# Patient Record
Sex: Male | Born: 1945 | Race: White | Hispanic: No | Marital: Married | Smoking: Former smoker
Health system: Southern US, Community
[De-identification: ages and names within clinical notes are randomized; demographics above are authoritative.]

## PROBLEM LIST (undated history)

## (undated) DIAGNOSIS — I519 Heart disease, unspecified: Secondary | ICD-10-CM

## (undated) DIAGNOSIS — Z95810 Presence of automatic (implantable) cardiac defibrillator: Secondary | ICD-10-CM

## (undated) DIAGNOSIS — Z0389 Encounter for observation for other suspected diseases and conditions ruled out: Secondary | ICD-10-CM

## (undated) DIAGNOSIS — E785 Hyperlipidemia, unspecified: Secondary | ICD-10-CM

## (undated) DIAGNOSIS — IMO0001 Reserved for inherently not codable concepts without codable children: Secondary | ICD-10-CM

## (undated) DIAGNOSIS — I4891 Unspecified atrial fibrillation: Secondary | ICD-10-CM

## (undated) DIAGNOSIS — I10 Essential (primary) hypertension: Secondary | ICD-10-CM

## (undated) DIAGNOSIS — R002 Palpitations: Secondary | ICD-10-CM

## (undated) DIAGNOSIS — I5020 Unspecified systolic (congestive) heart failure: Secondary | ICD-10-CM

## (undated) HISTORY — DX: Heart disease, unspecified: I51.9

## (undated) HISTORY — DX: Hyperlipidemia, unspecified: E78.5

## (undated) HISTORY — DX: Encounter for observation for other suspected diseases and conditions ruled out: Z03.89

## (undated) HISTORY — DX: Palpitations: R00.2

## (undated) HISTORY — DX: Reserved for inherently not codable concepts without codable children: IMO0001

## (undated) HISTORY — DX: Essential (primary) hypertension: I10

## (undated) HISTORY — DX: Unspecified atrial fibrillation: I48.91

---

## 2010-06-15 HISTORY — PX: NM MYOVIEW LTD: HXRAD82

## 2010-06-15 HISTORY — PX: DOPPLER ECHOCARDIOGRAPHY: SHX263

## 2014-08-28 DIAGNOSIS — J0100 Acute maxillary sinusitis, unspecified: Secondary | ICD-10-CM | POA: Diagnosis not present

## 2014-08-28 DIAGNOSIS — H66009 Acute suppurative otitis media without spontaneous rupture of ear drum, unspecified ear: Secondary | ICD-10-CM | POA: Diagnosis not present

## 2014-08-31 DIAGNOSIS — Z8042 Family history of malignant neoplasm of prostate: Secondary | ICD-10-CM | POA: Diagnosis not present

## 2014-08-31 DIAGNOSIS — R972 Elevated prostate specific antigen [PSA]: Secondary | ICD-10-CM | POA: Diagnosis not present

## 2014-08-31 DIAGNOSIS — N400 Enlarged prostate without lower urinary tract symptoms: Secondary | ICD-10-CM | POA: Diagnosis not present

## 2014-11-01 DIAGNOSIS — E039 Hypothyroidism, unspecified: Secondary | ICD-10-CM | POA: Diagnosis not present

## 2014-11-01 DIAGNOSIS — I10 Essential (primary) hypertension: Secondary | ICD-10-CM | POA: Diagnosis not present

## 2014-11-01 DIAGNOSIS — Z79899 Other long term (current) drug therapy: Secondary | ICD-10-CM | POA: Diagnosis not present

## 2014-11-08 ENCOUNTER — Encounter: Payer: Self-pay | Admitting: *Deleted

## 2014-11-14 ENCOUNTER — Ambulatory Visit (INDEPENDENT_AMBULATORY_CARE_PROVIDER_SITE_OTHER): Payer: Medicare Other

## 2014-11-14 ENCOUNTER — Encounter: Payer: Self-pay | Admitting: Cardiovascular Disease

## 2014-11-14 ENCOUNTER — Ambulatory Visit (INDEPENDENT_AMBULATORY_CARE_PROVIDER_SITE_OTHER): Payer: Medicare Other | Admitting: Cardiovascular Disease

## 2014-11-14 VITALS — BP 142/90 | HR 111 | Ht 72.0 in | Wt 203.8 lb

## 2014-11-14 DIAGNOSIS — E785 Hyperlipidemia, unspecified: Secondary | ICD-10-CM | POA: Insufficient documentation

## 2014-11-14 DIAGNOSIS — I4819 Other persistent atrial fibrillation: Secondary | ICD-10-CM

## 2014-11-14 DIAGNOSIS — I10 Essential (primary) hypertension: Secondary | ICD-10-CM

## 2014-11-14 DIAGNOSIS — R0602 Shortness of breath: Secondary | ICD-10-CM

## 2014-11-14 DIAGNOSIS — I4891 Unspecified atrial fibrillation: Secondary | ICD-10-CM | POA: Insufficient documentation

## 2014-11-14 DIAGNOSIS — I481 Persistent atrial fibrillation: Secondary | ICD-10-CM

## 2014-11-14 DIAGNOSIS — I482 Chronic atrial fibrillation, unspecified: Secondary | ICD-10-CM

## 2014-11-14 DIAGNOSIS — I480 Paroxysmal atrial fibrillation: Secondary | ICD-10-CM | POA: Diagnosis not present

## 2014-11-14 DIAGNOSIS — I519 Heart disease, unspecified: Secondary | ICD-10-CM | POA: Diagnosis not present

## 2014-11-14 MED ORDER — EDOXABAN TOSYLATE 60 MG PO TABS: 60.0000 mg | ORAL_TABLET | Freq: Every day | ORAL | Status: DC

## 2014-11-14 NOTE — Assessment & Plan Note (Addendum)
When I saw Mr. Feeback several years ago he had episodes of PAF on an event monitor. That point I did not feel he required oral anti-coagulation. Today he is in atrial fibrillation with a rapid ventricular response. His CHA2DSVASC2 score is 2. I've elected to begin him on a novel oral anticoagulative. I'm going to get a warm monitor to assess his heart rate variability to help decide whether to switch him to diltiazem. I'm also going to a 2-D echo because of increased shortness of breath.

## 2014-11-14 NOTE — Progress Notes (Signed)
11/14/2014 Marc Potter   03-20-1946  [id number]  Primary Physician No primary care provider on file. Primary Cardiologist: Marc Gess MD Marc Potter   HPI:  Marc Potter is a 69 year old moderately overweight married Caucasian male father of one biologic and 2 stepchildren, grandfather and 6 grandchildren whose mother-in-law, Marc Potter , is a patient of mine as well. He has history of hypertension and hyperlipidemia. He is a normal 2-D echo Myoview in the past. He had PAF on event monitoring she was symptomatic from it at that point several years ago I elected to begin him on aspirin alone. He does admit to mild increasing dyspnea on exertion but denies chest pain.   Current Outpatient Prescriptions  Medication Sig Dispense Refill  . amLODipine (NORVASC) 5 MG tablet Take 1 tablet by mouth daily.    [name] aspirin 81 MG tablet Take 81 mg by mouth daily.    . calcium carbonate (OS-CAL) 600 MG TABS tablet Take 600 mg by mouth 2 (two) times daily with a meal.    . multivitamin-lutein (OCUVITE-LUTEIN) CAPS capsule Take 1 capsule by mouth daily.    [name] thyroid (ARMOUR) 90 MG tablet Take 90 mg by mouth daily.     No current facility-administered medications for this visit.    No Known Allergies  History   Social History  . Marital Status: Married    Spouse Name: N/A  . Number of Children: N/A  . Years of Education: N/A   Occupational History  . Not on file.   Social History Main Topics  . Smoking status: Former Games developer  . Smokeless tobacco: Not on file  . Alcohol Use: Yes     Comment: DRINKS ON OCCASION   . Drug Use: No  . Sexual Activity: Not on file   Other Topics Concern  . Not on file   Social History Narrative     Review of Systems: General: negative for chills, fever, night sweats or weight changes.  Cardiovascular: negative for chest pain, dyspnea on exertion, edema, orthopnea, palpitations, paroxysmal nocturnal dyspnea or shortness of  breath Dermatological: negative for rash Respiratory: negative for cough or wheezing Urologic: negative for hematuria Abdominal: negative for nausea, vomiting, diarrhea, bright red blood per rectum, melena, or hematemesis Neurologic: negative for visual changes, syncope, or dizziness All other systems reviewed and are otherwise negative except as noted above.    Blood pressure 142/90, pulse 111, height 6' (1.829 m), weight 203 lb 12.8 oz (92.443 kg).  General appearance: alert and no distress Neck: no adenopathy, no carotid bruit, no JVD, supple, symmetrical, trachea midline and thyroid not enlarged, symmetric, no tenderness/mass/nodules Lungs: clear to auscultation bilaterally Heart: irregularly irregular rhythm Extremities: extremities normal, atraumatic, no cyanosis or edema  EKG a tilt ablation with a ventricular response in the 111 and septal Q waves and nonspecific ST and T-wave changes (inferolateral ischemia). I personally reviewed this EKG. This is unchanged from prior EKGs  ASSESSMENT AND PLAN:   Essential hypertension History of hypertension with blood pressure measured at 142/90. He is on amlodipine. I am considering changing to diltiazem for heart rate control after we've assessed heart rate variability with a 30 day event monitor   Atrial fibrillation When I saw Marc Potter several years ago he had episodes of PAF on an event monitor. That point I did not feel he required oral anti-coagulation. Today he is in atrial fibrillation with a rapid ventricular response. His CHA2DSVASC2 score is 2. I've elected to begin  him on a novel oral anticoagulative. I'm going to get a warm monitor to assess his heart rate variability to help decide whether to switch him to diltiazem. I'm also going to a 2-D echo because of increased shortness of breath.       Marc Gess MD FACP,FACC,FAHA, [facility] 11/14/2014 3:03 PM

## 2014-11-14 NOTE — Patient Instructions (Addendum)
Your physician has requested that you have an echocardiogram. Echocardiography is a painless test that uses sound waves to create images of your heart. It provides your doctor with information about the size and shape of your heart and how well your heart's chambers and valves are working. This procedure takes approximately one hour. There are no restrictions for this procedure.  Your physician has recommended that you wear an event monitor. Event monitors are medical devices that record the heart's electrical activity. Doctors most often [place] these monitors to diagnose arrhythmias. Arrhythmias are problems with the speed or rhythm of the heartbeat. The monitor is a small, portable device. You can wear one while you do your normal daily activities. This is usually used to diagnose what is causing palpitations/syncope (passing out).  Dr Allyson Sabal recommends that you schedule a follow-up appointment after you have completed your studies.  Your Doctor has ordered you to wear a heart monitor. You will wear this for 30 days.   TIPS -  REMINDERS 1. The sensor is the lanyard that is worn around your neck every day - this is powered by a battery that needs to be changed every day 2. The monitor is the device that allows you to record symptoms - this will need to be charged daily 3. The sensor & monitor need to be within 100 feet of each other at all times 4. The sensor connects to the electrodes (stickers) - these should be changed every 24-48 hours (you do not have to remove them when you bathe, just make sure they are dry when you connect it back to the sensor 5. If you need more supplies (electrodes, batteries), please call the 1-800 # on the back of the pamphlet and [employer] will mail you more supplies 6. If your skin becomes sensitive, please try the sample pack of sensitive skin electrodes (the white packet in your silver box) and call [employer] to have them mail you more of these type of electrodes 7. When  you are finish wearing the monitor, please place all supplies back in the silver box, place the silver box in the pre-packaged UPS bag and drop off at [employer] or call them so they can come pick it up   Cardiac Event Monitoring A cardiac event monitor is a small recording device used to help detect abnormal heart rhythms (arrhythmias). The monitor is used to record heart rhythm when noticeable symptoms such as the following occur:  Fast heartbeats (palpitations), such as heart racing or fluttering.  Dizziness.  Fainting or light-headedness.  Unexplained weakness. The monitor is wired to two electrodes placed on your chest. Electrodes are flat, sticky disks that attach to your skin. The monitor can be worn for up to 30 days. You will wear the monitor at all times, except when bathing.  HOW TO USE YOUR CARDIAC EVENT MONITOR A technician will prepare your chest for the electrode placement. The technician will show you how to place the electrodes, how to work the monitor, and how to replace the batteries. Take time to practice using the monitor before you leave the office. Make sure you understand how to send the information from the monitor to your health care provider. This requires a telephone with a landline, not a cell phone. You need to:  Wear your monitor at all times, except when you are in water:  Do not get the monitor wet.  Take the monitor off when bathing. Do not swim or use a hot tub with it  on.  Keep your skin clean. Do not put body lotion or moisturizer on your chest.  Change the electrodes daily or any time they stop sticking to your skin. You might need to use tape to keep them on.  It is possible that your skin under the electrodes could become irritated. To keep this from happening, try to put the electrodes in slightly different places on your chest. However, they must remain in the area under your left breast and in the upper right section of your chest.  Make sure the  monitor is safely clipped to your clothing or in a location close to your body that your health care provider recommends.  Press the button to record when you feel symptoms of heart trouble, such as dizziness, weakness, light-headedness, palpitations, thumping, shortness of breath, unexplained weakness, or a fluttering or racing heart. The monitor is always on and records what happened slightly before you pressed the button, so do not worry about being too late to get good information.  Keep a diary of your activities, such as walking, doing chores, and taking medicine. It is especially important to note what you were doing when you pushed the button to record your symptoms. This will help your health care provider determine what might be contributing to your symptoms. The information stored in your monitor will be reviewed by your health care provider alongside your diary entries.  Send the recorded information as recommended by your health care provider. It is important to understand that it will take some time for your health care provider to process the results.  Change the batteries as recommended by your health care provider. SEEK IMMEDIATE MEDICAL CARE IF:   You have chest pain.  You have extreme difficulty breathing or shortness of breath.  You develop a very fast heartbeat that persists.  You develop dizziness that does not go away.  You faint or constantly feel you are about to faint. Document Released: 03/10/2008 Document Revised: 10/16/2013 Document Reviewed: 11/28/2012 [facility] Patient Information 2015 [place], [place]. This information is not intended to replace advice given to you by your health care provider. Make sure you discuss any questions you have with your health care provider.  Medication samples have been provided to the patient. Drug name: Savaysa 60 mg  Qty: 28 tabs LOT: [id number] Exp.Date: 03/2015 Ian Bushman 3:22 PM 11/14/2014

## 2014-11-14 NOTE — Assessment & Plan Note (Signed)
History of hypertension with blood pressure measured at 142/90. He is on amlodipine. I am considering changing to diltiazem for heart rate control after we've assessed heart rate variability with a 30 day event monitor

## 2014-11-15 ENCOUNTER — Encounter: Payer: Self-pay | Admitting: Cardiovascular Disease

## 2014-11-19 DIAGNOSIS — H2513 Age-related nuclear cataract, bilateral: Secondary | ICD-10-CM | POA: Diagnosis not present

## 2014-11-19 DIAGNOSIS — H3531 Nonexudative age-related macular degeneration: Secondary | ICD-10-CM | POA: Diagnosis not present

## 2014-11-21 ENCOUNTER — Telehealth: Payer: Self-pay | Admitting: Cardiovascular Disease

## 2014-11-21 NOTE — Telephone Encounter (Signed)
Katrina from [employer] - autotrigger yesterday evening - high heart rate AF 175 bpm They will fax strips over for MD to review.

## 2014-11-21 NOTE — Telephone Encounter (Signed)
Urgent monitor reviewed by DOD Dr. Allyson Sabal - no medication changes advised.   Spoke with patient - he reports he had NO symptoms around the time of the urgent report but did note his FitBit reported his HR up to 119bpm.   He will record symptoms as he notices them and continue to wear monitor.

## 2014-11-21 NOTE — Telephone Encounter (Signed)
Patient reports around time of urgent report yesterday he was sweaty and the leads were coming off.   Of note, he does not have monitor on now, as he is hot and sweaty, but will put on later today

## 2014-11-21 NOTE — Telephone Encounter (Signed)
Pt says he needs to talk to you again.

## 2014-11-22 ENCOUNTER — Encounter: Payer: Self-pay | Admitting: *Deleted

## 2014-11-22 ENCOUNTER — Other Ambulatory Visit: Payer: Self-pay | Admitting: *Deleted

## 2014-11-22 DIAGNOSIS — E785 Hyperlipidemia, unspecified: Secondary | ICD-10-CM

## 2014-11-23 ENCOUNTER — Telehealth: Payer: Self-pay | Admitting: Cardiovascular Disease

## 2014-11-23 NOTE — Telephone Encounter (Signed)
Wrong provider

## 2014-11-23 NOTE — Telephone Encounter (Signed)
Caryn Bee has an abnormal EKG to report

## 2014-11-23 NOTE — Telephone Encounter (Signed)
Strips reviewed by Dr. Antoine Poche - no changes indicated. Placed printout in accordion file.

## 2014-11-23 NOTE — Telephone Encounter (Signed)
Rapid AF- lasting 90 seconds High HR 170bpms  [facility] will fax strips

## 2014-11-24 ENCOUNTER — Telehealth: Payer: Self-pay | Admitting: Cardiology

## 2014-11-24 NOTE — Telephone Encounter (Signed)
Contacted by [employer]-pt had 65 seconds of tachycardia. I called pt to discuss switching him to Diltiazem but he felt that wasn't neccessary at this time.   Corine Shelter PA-C 11/24/2014 9:05 AM

## 2014-11-26 ENCOUNTER — Ambulatory Visit (HOSPITAL_COMMUNITY)
Admission: RE | Admit: 2014-11-26 | Discharge: 2014-11-26 | Disposition: A | Discharge status: Home / Self Care | Payer: Medicare Other | Source: Ambulatory Visit | Attending: Internal Medicine | Admitting: Internal Medicine

## 2014-11-26 DIAGNOSIS — I517 Cardiomegaly: Secondary | ICD-10-CM | POA: Diagnosis not present

## 2014-11-26 DIAGNOSIS — R0600 Dyspnea, unspecified: Secondary | ICD-10-CM | POA: Diagnosis present

## 2014-11-26 DIAGNOSIS — I340 Nonrheumatic mitral (valve) insufficiency: Secondary | ICD-10-CM | POA: Insufficient documentation

## 2014-11-26 DIAGNOSIS — I519 Heart disease, unspecified: Secondary | ICD-10-CM | POA: Diagnosis not present

## 2014-11-26 DIAGNOSIS — R0602 Shortness of breath: Secondary | ICD-10-CM

## 2014-11-26 NOTE — Progress Notes (Signed)
2D Echo Completed, 11-26-14.  Xana Bradt, RDCS.  The Echo shows reduced function, EF around 20-25%.  These findings were discussed with DOD, Dr. Rennis Golden who suggested a follow up appointment be scheduled.  Mr. Hooven is having no sympotoms at the time of exam and appears stable.  He has been put on a re-call list for an appointment after his Event Monitor is removed.

## 2014-12-03 ENCOUNTER — Telehealth: Payer: Self-pay | Admitting: Cardiovascular Disease

## 2014-12-03 NOTE — Telephone Encounter (Signed)
[facility] called to report A Fib - rate 168, autotriggered.  Occurred yesterday, 6:42pm w/ 75 second duration.  Returned to regular rate.  Fax pending

## 2014-12-03 NOTE — Telephone Encounter (Signed)
Marc Potter has some urgent EKG results.

## 2014-12-05 ENCOUNTER — Ambulatory Visit: Payer: Medicare Other | Admitting: Cardiovascular Disease

## 2014-12-05 NOTE — Telephone Encounter (Signed)
Can this encounter be closed?

## 2014-12-06 ENCOUNTER — Telehealth: Payer: Self-pay | Admitting: Cardiovascular Disease

## 2014-12-06 NOTE — Telephone Encounter (Signed)
°  1. Which medications need to be refilled? Savaysa-pt is out of town-need enough until he gets back in town  2. Which pharmacy is medication to be sent to?[phone]  3. Do they need a 30 day or 90 day supply? #6  4. Would they like a call back once the medication has been sent to the pharmacy? yes

## 2014-12-06 NOTE — Telephone Encounter (Signed)
Phoned in Rx for savaysa 60mg  (take 1 PO QD) #6 with NO refills.  Unable to e-scribe medication.  Patient is aware that medication will be phoned in today

## 2014-12-10 ENCOUNTER — Telehealth: Payer: Self-pay | Admitting: Cardiovascular Disease

## 2014-12-10 MED ORDER — EDOXABAN TOSYLATE 60 MG PO TABS: 60.0000 mg | ORAL_TABLET | Freq: Every day | ORAL | Status: DC

## 2014-12-10 NOTE — Telephone Encounter (Signed)
Spoke to patient  Samples available 3 packs Patient will pick up

## 2014-12-10 NOTE — Telephone Encounter (Signed)
Patient calling the office for samples of medication:   1.  What medication and dosage are you requesting samples for? Savaysa  2.  Are you currently out of this medication? He has 4 pills left, he needed some samples to last him until his next appt on 7/12  3. Are you requesting samples to get you through until a mail order prescription arrives? No

## 2014-12-13 ENCOUNTER — Telehealth: Payer: Self-pay | Admitting: Cardiovascular Disease

## 2014-12-13 MED ORDER — DILTIAZEM HCL ER COATED BEADS 180 MG PO CP24: 180.0000 mg | ORAL_CAPSULE | Freq: Every day | ORAL | Status: DC

## 2014-12-13 NOTE — Telephone Encounter (Signed)
Received a call from Shanda Bumps calling to report at 11:22 am today Afib with rate 158.Will fax a copy of EKG strip.

## 2014-12-13 NOTE — Telephone Encounter (Signed)
Received a call from [facility] patient had another episode of AFib at 3:03 pm today rate 170.Spoke to patient he stated he just picked up diltiazem.Advised to start taking 180 mg daily.Stop amlodipine.Keep appointment with Dr.Berry as planned.

## 2014-12-13 NOTE — Telephone Encounter (Signed)
[facility] called reporting another episode of HHR A Fib. 168 bpm, autotrigger event. Will show fax to Dr. Herbie Baltimore or Dr. Allyson Sabal once received for [facility], med change instructions already given for patient  in response to prior episode.

## 2014-12-13 NOTE — Telephone Encounter (Signed)
Patient called Marc Potter Dr.Harding reviewed EKG strip which revealed AFib rate 158 at 11:21 am.He advised to stop amlodipine and start diltiazem 180 mg daily.Advised to keep appointment with Dr.Berry 12/18/14 at 3:30 pm.

## 2014-12-13 NOTE — Telephone Encounter (Signed)
Please call,pt wants to talk to you again.

## 2014-12-13 NOTE — Telephone Encounter (Signed)
[facility] is calling a abnormal EKG .

## 2014-12-18 ENCOUNTER — Ambulatory Visit (INDEPENDENT_AMBULATORY_CARE_PROVIDER_SITE_OTHER): Payer: Medicare Other | Admitting: Cardiovascular Disease

## 2014-12-18 ENCOUNTER — Encounter: Payer: Self-pay | Admitting: Cardiovascular Disease

## 2014-12-18 VITALS — BP 130/70 | HR 61 | Ht 72.0 in | Wt 200.2 lb

## 2014-12-18 DIAGNOSIS — I519 Heart disease, unspecified: Secondary | ICD-10-CM

## 2014-12-18 DIAGNOSIS — D689 Coagulation defect, unspecified: Secondary | ICD-10-CM | POA: Diagnosis not present

## 2014-12-18 DIAGNOSIS — Z79899 Other long term (current) drug therapy: Secondary | ICD-10-CM

## 2014-12-18 DIAGNOSIS — E785 Hyperlipidemia, unspecified: Secondary | ICD-10-CM

## 2014-12-18 DIAGNOSIS — I482 Chronic atrial fibrillation, unspecified: Secondary | ICD-10-CM

## 2014-12-18 DIAGNOSIS — Z87891 Personal history of nicotine dependence: Secondary | ICD-10-CM

## 2014-12-18 DIAGNOSIS — R5383 Other fatigue: Secondary | ICD-10-CM

## 2014-12-18 DIAGNOSIS — I10 Essential (primary) hypertension: Secondary | ICD-10-CM

## 2014-12-18 NOTE — Patient Instructions (Signed)
Your physician has requested that you have a radial cardiac catheterization. Cardiac catheterization is used to diagnose and/or treat various heart conditions. Doctors may recommend this procedure for a number of different reasons. The most common reason is to evaluate chest pain. Chest pain can be a symptom of coronary artery disease (CAD), and cardiac catheterization can show whether plaque is narrowing or blocking your heart's arteries. This procedure is also used to evaluate the valves, as well as measure the blood flow and oxygen levels in different parts of your heart. For further information please visit [email or web address]   Following your catheterization, you will not be allowed to drive for 3 days.  No lifting, pushing, or pulling greater that 10 pounds is allowed for 1 week.  You will be required to have the following tests prior to the procedure:  1. Blood work-the blood work can be done no more than 7 days prior to the procedure.  It can be done at any [facility] lab.  There is one downstairs on the first floor of this building and one in the [facility] building [id number]).  2. Chest Xray-the chest xray order has already been placed at the [facility].     **Please stop your Savaysa 3 days prior to your procedure.  You will be told when to restart the medication after your procedure.

## 2014-12-18 NOTE — Assessment & Plan Note (Signed)
Atrial fibrillation with a controlled ventricular response currently on calcium channel blocker and oral anticoagulation

## 2014-12-18 NOTE — Assessment & Plan Note (Signed)
History of hypertension with blood pressure measured today at 130/70. He is on diltiazem CD 180 mg a day.

## 2014-12-18 NOTE — Assessment & Plan Note (Addendum)
Marc Potter returns today for follow-up of his 2-D echo. I saw him a month ago with complaints of increasing dyspnea. Echo performed on 03/12/11 showed normal LV systolic function. Echo performed 11/26/14 revealed an EF of 25-30% with diffuse hypokinesia and akinesia of the mid anteroapical and septal. Given his significant decline in EF with focal wall motion allergies I feel compelled to perform cardiac catheterization to define his anatomy and rule out an ischemic etiology. I'm scheduling this next week. Right radial approach. We'll stop his I'll anticoagulate several days prior to the study.we may need to start him on an ACE inhibitor and a beta blocker as well.

## 2014-12-18 NOTE — Progress Notes (Signed)
Marc Potter returns for follow-up of his 2-D echo which showed a decline in LV function from normal 4 years ago to 25% with anterior wall motion analysis. His monitor showed A. Fib with RVR. I did change his amlodipine to diltiazem with an decline in his ventricular response and added an oral anticoagulated. Based on a decline in LV function and going to proceed with outpatient diagnostic coronary arteriography via the right radial approach to rule out an ischemic etiology. I feel he explained the risks and benefits.  [name], M.D., FACP, FACC, FAHA, FSCAI [facility] [address] [place], [place]  [address]  [phone] 12/18/2014 4:17 PM  

## 2014-12-18 NOTE — Assessment & Plan Note (Signed)
Currently not on a statin drug. We will check a lipid and liver profile

## 2014-12-20 ENCOUNTER — Ambulatory Visit
Admission: RE | Admit: 2014-12-20 | Discharge: 2014-12-20 | Disposition: A | Discharge status: Home / Self Care | Payer: Medicare Other | Source: Ambulatory Visit | Attending: Cardiovascular Disease | Admitting: Cardiovascular Disease

## 2014-12-20 DIAGNOSIS — R0602 Shortness of breath: Secondary | ICD-10-CM | POA: Diagnosis not present

## 2014-12-20 DIAGNOSIS — E785 Hyperlipidemia, unspecified: Secondary | ICD-10-CM | POA: Diagnosis not present

## 2014-12-20 DIAGNOSIS — Z79899 Other long term (current) drug therapy: Secondary | ICD-10-CM | POA: Diagnosis not present

## 2014-12-20 DIAGNOSIS — Z87891 Personal history of nicotine dependence: Secondary | ICD-10-CM

## 2014-12-20 LAB — BASIC METABOLIC PANEL
BUN: 12 mg/dL (ref 6–23)
CO2: 26 meq/L (ref 19–32)
Calcium: 9.7 mg/dL (ref 8.4–10.5)
Chloride: 107 mEq/L (ref 96–112)
Creat: 0.94 mg/dL (ref 0.50–1.35)
GLUCOSE: 106 mg/dL — AB (ref 70–99)
POTASSIUM: 4.4 meq/L (ref 3.5–5.3)
SODIUM: 144 meq/L (ref 135–145)

## 2014-12-20 LAB — CBC
HCT: 44.9 % (ref 39.0–52.0)
HEMOGLOBIN: 15.3 g/dL (ref 13.0–17.0)
MCH: 31.4 pg (ref 26.0–34.0)
MCHC: 34.1 g/dL (ref 30.0–36.0)
MCV: 92.2 fL (ref 78.0–100.0)
MPV: 12 fL (ref 8.6–12.4)
Platelets: 192 10*3/uL (ref 150–400)
RBC: 4.87 MIL/uL (ref 4.22–5.81)
RDW: 13.4 % (ref 11.5–15.5)
WBC: 5.4 10*3/uL (ref 4.0–10.5)

## 2014-12-20 LAB — LIPID PANEL
CHOL/HDL RATIO: 3.8 ratio
Cholesterol: 164 mg/dL (ref 0–200)
HDL: 43 mg/dL (ref >=40)
LDL CALC: 79 mg/dL (ref 0–99)
Triglycerides: 209 mg/dL — ABNORMAL HIGH (ref <150)
VLDL: 42 mg/dL — ABNORMAL HIGH (ref 0–40)

## 2014-12-20 LAB — HEPATIC FUNCTION PANEL
ALBUMIN: 4.5 g/dL (ref 3.5–5.2)
ALK PHOS: 57 U/L (ref 39–117)
ALT: 25 U/L (ref 0–53)
AST: 19 U/L (ref 0–37)
BILIRUBIN TOTAL: 0.7 mg/dL (ref 0.2–1.2)
Bilirubin, Direct: 0.1 mg/dL (ref 0.0–0.3)
Indirect Bilirubin: 0.6 mg/dL (ref 0.2–1.2)
Total Protein: 6.3 g/dL (ref 6.0–8.3)

## 2014-12-20 LAB — APTT: aPTT: 33 seconds (ref 24–37)

## 2014-12-20 LAB — PROTIME-INR
INR: 1.18 (ref <1.50)
PROTHROMBIN TIME: 15 s (ref 11.6–15.2)

## 2014-12-20 LAB — TSH: TSH: 0.943 u[IU]/mL (ref 0.350–4.500)

## 2014-12-24 ENCOUNTER — Ambulatory Visit (HOSPITAL_COMMUNITY)
Admission: RE | Admit: 2014-12-24 | Discharge: 2014-12-24 | Disposition: A | Discharge status: Home / Self Care | Payer: Medicare Other | Source: Ambulatory Visit | Attending: Cardiovascular Disease | Admitting: Cardiovascular Disease

## 2014-12-24 ENCOUNTER — Encounter (HOSPITAL_COMMUNITY)
Admission: RE | Disposition: A | Discharge status: Home / Self Care | Payer: Medicare Other | Source: Ambulatory Visit | Attending: Cardiovascular Disease

## 2014-12-24 DIAGNOSIS — Z7982 Long term (current) use of aspirin: Secondary | ICD-10-CM | POA: Insufficient documentation

## 2014-12-24 DIAGNOSIS — I429 Cardiomyopathy, unspecified: Secondary | ICD-10-CM | POA: Diagnosis not present

## 2014-12-24 DIAGNOSIS — I10 Essential (primary) hypertension: Secondary | ICD-10-CM | POA: Diagnosis not present

## 2014-12-24 DIAGNOSIS — E663 Overweight: Secondary | ICD-10-CM | POA: Diagnosis not present

## 2014-12-24 DIAGNOSIS — I482 Chronic atrial fibrillation: Secondary | ICD-10-CM | POA: Insufficient documentation

## 2014-12-24 DIAGNOSIS — I519 Heart disease, unspecified: Secondary | ICD-10-CM | POA: Diagnosis not present

## 2014-12-24 DIAGNOSIS — E785 Hyperlipidemia, unspecified: Secondary | ICD-10-CM | POA: Diagnosis not present

## 2014-12-24 HISTORY — PX: CARDIAC CATHETERIZATION: SHX172

## 2014-12-24 SURGERY — LEFT HEART CATH AND CORONARY ANGIOGRAPHY
Anesthesia: LOCAL

## 2014-12-24 MED ORDER — SODIUM CHLORIDE 0.9 % IV SOLN: 250.0000 mL | INTRAVENOUS | Status: DC | PRN

## 2014-12-24 MED ORDER — SODIUM CHLORIDE 0.9 % IJ SOLN: 3.0000 mL | INTRAMUSCULAR | Status: DC | PRN

## 2014-12-24 MED ORDER — MIDAZOLAM HCL 2 MG/2ML IJ SOLN
INTRAMUSCULAR | Status: DC | PRN
  Administered 2014-12-24: 1 mg via INTRAVENOUS

## 2014-12-24 MED ORDER — VERAPAMIL HCL 2.5 MG/ML IV SOLN
INTRAVENOUS | Status: AC
  Filled 2014-12-24: qty 2

## 2014-12-24 MED ORDER — HEPARIN (PORCINE) IN NACL 2-0.9 UNIT/ML-% IJ SOLN
INTRAMUSCULAR | Status: AC
  Filled 2014-12-24: qty 1000

## 2014-12-24 MED ORDER — LIDOCAINE HCL (PF) 1 % IJ SOLN
INTRAMUSCULAR | Status: AC
  Filled 2014-12-24: qty 30

## 2014-12-24 MED ORDER — SODIUM CHLORIDE 0.9 % IJ SOLN
3.0000 mL | Freq: Two times a day (BID) | INTRAMUSCULAR | Status: DC
Start: 2014-12-24 — End: 2014-12-24

## 2014-12-24 MED ORDER — FENTANYL CITRATE (PF) 100 MCG/2ML IJ SOLN
INTRAMUSCULAR | Status: AC
  Filled 2014-12-24: qty 2

## 2014-12-24 MED ORDER — RADIAL COCKTAIL (HEPARIN/VERAPAMIL/LIDOCAINE/NITRO)
Status: DC | PRN
  Administered 2014-12-24: 1 via INTRA_ARTERIAL

## 2014-12-24 MED ORDER — NITROGLYCERIN 1 MG/10 ML FOR IR/CATH LAB
INTRA_ARTERIAL | Status: AC
  Filled 2014-12-24: qty 10

## 2014-12-24 MED ORDER — FENTANYL CITRATE (PF) 100 MCG/2ML IJ SOLN
INTRAMUSCULAR | Status: DC | PRN
  Administered 2014-12-24: 25 ug via INTRAVENOUS

## 2014-12-24 MED ORDER — MIDAZOLAM HCL 2 MG/2ML IJ SOLN
INTRAMUSCULAR | Status: AC
  Filled 2014-12-24: qty 2

## 2014-12-24 MED ORDER — MORPHINE SULFATE 2 MG/ML IJ SOLN: 2.0000 mg | INTRAMUSCULAR | Status: DC | PRN

## 2014-12-24 MED ORDER — ONDANSETRON HCL 4 MG/2ML IJ SOLN: 4.0000 mg | Freq: Four times a day (QID) | INTRAMUSCULAR | Status: DC | PRN

## 2014-12-24 MED ORDER — ASPIRIN 81 MG PO CHEW
81.0000 mg | CHEWABLE_TABLET | ORAL | Status: AC
  Administered 2014-12-24: 81 mg via ORAL

## 2014-12-24 MED ORDER — LIDOCAINE HCL (PF) 1 % IJ SOLN
INTRAMUSCULAR | Status: DC | PRN
  Administered 2014-12-24: 5 mL

## 2014-12-24 MED ORDER — ACETAMINOPHEN 325 MG PO TABS: 650.0000 mg | ORAL_TABLET | ORAL | Status: DC | PRN

## 2014-12-24 MED ORDER — SODIUM CHLORIDE 0.9 % WEIGHT BASED INFUSION
3.0000 mL/kg/h | INTRAVENOUS | Status: DC
  Administered 2014-12-24: 3 mL/kg/h via INTRAVENOUS

## 2014-12-24 MED ORDER — SODIUM CHLORIDE 0.9 % WEIGHT BASED INFUSION: 3.0000 mL/kg/h | INTRAVENOUS | Status: DC

## 2014-12-24 MED ORDER — SODIUM CHLORIDE 0.9 % WEIGHT BASED INFUSION: 1.0000 mL/kg/h | INTRAVENOUS | Status: DC

## 2014-12-24 MED ORDER — HEPARIN SODIUM (PORCINE) 1000 UNIT/ML IJ SOLN
INTRAMUSCULAR | Status: AC
  Filled 2014-12-24: qty 1

## 2014-12-24 MED ORDER — ASPIRIN 81 MG PO CHEW
CHEWABLE_TABLET | ORAL | Status: AC
  Filled 2014-12-24: qty 1

## 2014-12-24 MED ORDER — HEPARIN SODIUM (PORCINE) 1000 UNIT/ML IJ SOLN
INTRAMUSCULAR | Status: DC | PRN
  Administered 2014-12-24: 4000 [IU] via INTRAVENOUS

## 2014-12-24 MED ORDER — HEPARIN (PORCINE) IN NACL 2-0.9 UNIT/ML-% IJ SOLN
INTRAMUSCULAR | Status: AC
  Filled 2014-12-24: qty 500

## 2014-12-24 SURGICAL SUPPLY — 11 items

## 2014-12-24 NOTE — Discharge Instructions (Signed)
Radial Site Care °Refer to this sheet in the next few weeks. These instructions provide you with information on caring for yourself after your procedure. Your caregiver may also give you more specific instructions. Your treatment has been planned according to current medical practices, but problems sometimes occur. Call your caregiver if you have any problems or questions after your procedure. °HOME CARE INSTRUCTIONS °· You may shower the day after the procedure. Remove the bandage (dressing) and gently wash the site with plain soap and water. Gently pat the site dry. °· Do not apply powder or lotion to the site. °· Do not submerge the affected site in water for 3 to 5 days. °· Inspect the site at least twice daily. °· Do not flex or bend the affected arm for 24 hours. °· No lifting over 5 pounds (2.3 kg) for 5 days after your procedure. °· Do not drive home if you are discharged the same day of the procedure. Have someone else drive you. °· You may drive 24 hours after the procedure unless otherwise instructed by your caregiver. °· Do not operate machinery or power tools for 24 hours. °· A responsible adult should be with you for the first 24 hours after you arrive home. °What to expect: °· Any bruising will usually fade within 1 to 2 weeks. °· Blood that collects in the tissue (hematoma) may be painful to the touch. It should usually decrease in size and tenderness within 1 to 2 weeks. °SEEK IMMEDIATE MEDICAL CARE IF: °· You have unusual pain at the radial site. °· You have redness, warmth, swelling, or pain at the radial site. °· You have drainage (other than a small amount of blood on the dressing). °· You have chills. °· You have a fever or persistent symptoms for more than 72 hours. °· You have a fever and your symptoms suddenly get worse. °· Your arm becomes pale, cool, tingly, or numb. °· You have heavy bleeding from the site. Hold pressure on the site. °Document Released: 07/04/2010 Document Revised:  08/24/2011 Document Reviewed: 07/04/2010 °ExitCare® Patient Information ©2015 [employer]. This information is not intended to replace advice given to you by your health care provider. Make sure you discuss any questions you have with your health care provider. ° °

## 2014-12-24 NOTE — Interval H&P Note (Signed)
Cath Lab Visit (complete for each Cath Lab visit)  Clinical Evaluation Leading to the Procedure:   ACS: No.  Non-ACS:    Anginal Classification: CCS II  Anti-ischemic medical therapy: Minimal Therapy (1 class of medications)  Non-Invasive Test Results: No non-invasive testing performed  Prior CABG: No previous CABG      History and Physical Interval Note:  12/24/2014 10:57 AM  Marc Potter  has presented today for surgery, with the diagnosis of abnormal echo  The various methods of treatment have been discussed with the patient and family. After consideration of risks, benefits and other options for treatment, the patient has consented to  Procedure(s): Left Heart Cath and Coronary Angiography (N/A) as a surgical intervention .  The patient's history has been reviewed, patient examined, no change in status, stable for surgery.  I have reviewed the patient's chart and labs.  Questions were answered to the patient's satisfaction.     [name]

## 2014-12-24 NOTE — H&P (View-Only) (Signed)
Marc Potter returns for follow-up of his 2-D echo which showed a decline in LV function from normal 4 years ago to 25% with anterior wall motion analysis. His monitor showed A. Fib with RVR. I did change his amlodipine to diltiazem with an decline in his ventricular response and added an oral anticoagulated. Based on a decline in LV function and going to proceed with outpatient diagnostic coronary arteriography via the right radial approach to rule out an ischemic etiology. I feel he explained the risks and benefits.  Runell Gess, M.D., FACP, [employer], [facility] [address] [place], [place]  [address]  [phone] 12/18/2014 4:17 PM

## 2014-12-25 ENCOUNTER — Encounter (HOSPITAL_COMMUNITY): Payer: Self-pay | Admitting: Cardiovascular Disease

## 2014-12-25 ENCOUNTER — Ambulatory Visit: Payer: Medicare Other | Admitting: Cardiovascular Disease

## 2014-12-25 MED FILL — Nitroglycerin IV Soln 100 MCG/ML in D5W: INTRA_ARTERIAL | Qty: 10 | Status: AC

## 2014-12-25 MED FILL — Heparin Sodium (Porcine) 2 Unit/ML in Sodium Chloride 0.9%: INTRAMUSCULAR | Qty: 1500 | Status: AC

## 2014-12-26 ENCOUNTER — Encounter: Payer: Self-pay | Admitting: Cardiovascular Disease

## 2015-01-01 ENCOUNTER — Ambulatory Visit: Payer: Medicare Other | Admitting: Cardiovascular Disease

## 2015-01-09 ENCOUNTER — Encounter: Payer: Self-pay | Admitting: Cardiovascular Disease

## 2015-01-09 ENCOUNTER — Ambulatory Visit (INDEPENDENT_AMBULATORY_CARE_PROVIDER_SITE_OTHER): Payer: Medicare Other | Admitting: Cardiovascular Disease

## 2015-01-09 DIAGNOSIS — E785 Hyperlipidemia, unspecified: Secondary | ICD-10-CM | POA: Diagnosis not present

## 2015-01-09 DIAGNOSIS — I519 Heart disease, unspecified: Secondary | ICD-10-CM

## 2015-01-09 MED ORDER — CARVEDILOL 6.25 MG PO TABS: 6.2500 mg | ORAL_TABLET | Freq: Two times a day (BID) | ORAL | Status: DC

## 2015-01-09 MED ORDER — EDOXABAN TOSYLATE 60 MG PO TABS: 60.0000 mg | ORAL_TABLET | Freq: Every day | ORAL | Status: DC

## 2015-01-09 NOTE — Assessment & Plan Note (Signed)
Ejection fraction measured at 30% by left ventriculography. He has nonischemic cardio myopathy. I'm going to change his diltiazem to carvedilol and we'll uptitrate to 25 mg by mouth twice a day. He may benefit from outpatient DC cardioversion. He is on oral anticoagulant .[name] If his EF does not improve within 3 months he may require elective ICD implantation for primary prevention

## 2015-01-09 NOTE — Patient Instructions (Signed)
Medication Instructions:   Stop Diltiazem  Start Coreg 6.25mg  twice a day  Labwork:  n/a  Testing/Procedures:  n/a  Follow-Up:  2 weeks with Belenda Cruise for medication titration 6 weeks with Dr Allyson Sabal  Any Other Special Instructions Will Be Listed Below (If Applicable).

## 2015-01-09 NOTE — Assessment & Plan Note (Signed)
History of chronic A. Fib on  Savaysa oral anticoagulation rate controlled on diltiazem. I'm going to change his diltiazem to carvedilol for rate control and hopefully to improve LV function. He may benefit from outpatient elective DC cardioversion to assist in LV function recovery.

## 2015-01-09 NOTE — Progress Notes (Signed)
01/09/2015 Marc Potter   August 31, 1945  [id number]  Primary Physician Marc Potter., MD Primary Cardiologist: Marc Gess MD Marc Potter   HPI:  Marc Potter is a 69 year old moderately overweight married Caucasian male father of one biologic and 2 stepchildren, grandfather and 6 grandchildren whose mother-in-law, Marc Potter , is a patient of mine as well. I just saw him in the office 12/18/14.He has history of hypertension and hyperlipidemia. He is a normal 2-D echo Myoview in the past. He had PAF on event monitoring she was symptomatic from it at that point several years ago I elected to begin him on aspirin alone. He does admit to mild increasing dyspnea on exertion but denies chest pain. He underwent outpatient diagnostic coronary arteriography by myself on 12/24/14 via the right radial approach revealing normal coronary arteries and severe LV dysfunction with an EF of 30%. His dyspnea has somewhat improved. He remains in A. Fib with a ventricular response in the 70s today.  Current Outpatient Prescriptions  Medication Sig Dispense Refill  . aspirin 81 MG tablet Take 81 mg by mouth daily.    . calcium carbonate (OS-CAL) 600 MG TABS tablet Take 600 mg by mouth daily.     . Coenzyme Q10 (CO Q-10) 100 MG CAPS Take 1 tablet by mouth daily.    . Cyanocobalamin (VITAMELTS ENERGY VITAMIN B-12 PO) Take 1 tablet by mouth daily.    [name] edoxaban (SAVAYSA) 60 MG TABS tablet Take 60 mg by mouth daily. 21 tablet 0  . glucosamine-chondroitin 500-400 MG tablet Take 1 tablet by mouth daily.    . multivitamin-lutein (OCUVITE-LUTEIN) CAPS capsule Take 1 capsule by mouth daily.    [name] thyroid (ARMOUR) 90 MG tablet Take 90 mg by mouth daily.    . carvedilol (COREG) 6.25 MG tablet Take 1 tablet (6.25 mg total) by mouth 2 (two) times daily. 60 tablet 11   No current facility-administered medications for this visit.    No Known Allergies  History   Social History  . Marital Status:  Married    Spouse Name: N/A  . Number of Children: N/A  . Years of Education: N/A   Occupational History  . Not on file.   Social History Main Topics  . Smoking status: Former Games developer  . Smokeless tobacco: Not on file  . Alcohol Use: Yes     Comment: DRINKS ON OCCASION   . Drug Use: No  . Sexual Activity: Not on file   Other Topics Concern  . Not on file   Social History Narrative     Review of Systems: General: negative for chills, fever, night sweats or weight changes.  Cardiovascular: negative for chest pain, dyspnea on exertion, edema, orthopnea, palpitations, paroxysmal nocturnal dyspnea or shortness of breath Dermatological: negative for rash Respiratory: negative for cough or wheezing Urologic: negative for hematuria Abdominal: negative for nausea, vomiting, diarrhea, bright red blood per rectum, melena, or hematemesis Neurologic: negative for visual changes, syncope, or dizziness All other systems reviewed and are otherwise negative except as noted above.    Blood pressure 116/64, pulse 50, height 6' (1.829 m), weight 197 lb 1.6 oz (89.404 kg).  General appearance: alert and no distress Neck: no adenopathy, no carotid bruit, no JVD, supple, symmetrical, trachea midline and thyroid not enlarged, symmetric, no tenderness/mass/nodules Lungs: clear to auscultation bilaterally Heart: irregularly irregular rhythm Extremities: extremities normal, atraumatic, no cyanosis or edema and right radial artery puncture site well-healed  EKG A. Fib with ventricular response  of 72 and inferolateral T-wave inversion. I personally reviewed his EKG  ASSESSMENT AND PLAN:   Left ventricular dysfunction Ejection fraction measured at 30% by left ventriculography. He has nonischemic cardio myopathy. I'm going to change his diltiazem to carvedilol and we'll uptitrate to 25 mg by mouth twice a day. He may benefit from outpatient DC cardioversion. He is on oral anticoagulant .[name] If his EF  does not improve within 3 months he may require elective ICD implantation for primary prevention  Hyperlipidemia History of hyperlipidemia in the past not on a statin drug with his last lipid profile performed 12/18/14 revealing total cholesterol of 164, LDL 79 and HDL of 43  Essential hypertension History of hypertension blood pressure measured at 116/64. He is on diltiazem 180. Continue current meds at current dosing  Atrial fibrillation History of chronic A. Fib on  Savaysa oral anticoagulation rate controlled on diltiazem. I'm going to change his diltiazem to carvedilol for rate control and hopefully to improve LV function. He may benefit from outpatient elective DC cardioversion to assist in LV function recovery.      Marc Gess MD FACP,FACC,FAHA, [facility] 01/09/2015 10:35 AM

## 2015-01-09 NOTE — Assessment & Plan Note (Signed)
History of hypertension blood pressure measured at 116/64. He is on diltiazem 180. Continue current meds at current dosing

## 2015-01-09 NOTE — Assessment & Plan Note (Signed)
History of hyperlipidemia in the past not on a statin drug with his last lipid profile performed 12/18/14 revealing total cholesterol of 164, LDL 79 and HDL of 43

## 2015-01-14 ENCOUNTER — Telehealth: Payer: Self-pay | Admitting: Cardiovascular Disease

## 2015-01-14 NOTE — Telephone Encounter (Signed)
Pt is taking Savaysa,he says it is too expensive. He would like to change to Xarelto.Please call him.

## 2015-01-14 NOTE — Telephone Encounter (Signed)
After insurance, savaysa is over $400/90 day supply. Insurance lady told him xarelto is abut 1/4 expensive.  Spoke with Belenda Cruise who advised offering savaysa samples - xarelto - eliquis (has best patient assistance program) She states would be OK to sample him thru end of year  Offered patient the options and he thinks he is doing well on savaysa and would like to stay on this medication.  6 weeks of savaysa 30mg  tablets (patient voiced understanding to take 2 tablets daily to make 60mg ) given to patient LOT: [id number] Exp: 02/2015

## 2015-01-17 NOTE — Addendum Note (Signed)
Addended byMarella Bile. on: 01/17/2015 11:07 AM   Modules accepted: Orders

## 2015-01-24 ENCOUNTER — Ambulatory Visit (INDEPENDENT_AMBULATORY_CARE_PROVIDER_SITE_OTHER): Payer: Medicare Other | Admitting: Pharmacist Clinician (PhC)/ Clinical Pharmacy Specialist

## 2015-01-24 ENCOUNTER — Encounter: Payer: Self-pay | Admitting: Pharmacist Clinician (PhC)/ Clinical Pharmacy Specialist

## 2015-01-24 VITALS — BP 118/56 | HR 64 | Ht 72.0 in | Wt 192.1 lb

## 2015-01-24 DIAGNOSIS — I481 Persistent atrial fibrillation: Secondary | ICD-10-CM | POA: Diagnosis not present

## 2015-01-24 DIAGNOSIS — I4819 Other persistent atrial fibrillation: Secondary | ICD-10-CM

## 2015-01-24 DIAGNOSIS — I519 Heart disease, unspecified: Secondary | ICD-10-CM | POA: Diagnosis not present

## 2015-01-24 MED ORDER — CARVEDILOL 12.5 MG PO TABS: 12.5000 mg | ORAL_TABLET | Freq: Two times a day (BID) | ORAL | Status: DC

## 2015-01-24 NOTE — Assessment & Plan Note (Signed)
Gave more samples of Savaysa today, but patient understands that we will probably need to switch to a covered medication.  They will contact the insurance company to figure out which is covered and let us know.

## 2015-01-24 NOTE — Assessment & Plan Note (Signed)
Pt is doing well with low dose carvedilol.  Today we will increase the dose to 12.5 mg bid.  I was planning to see him again in 3 weeks for further titration, however he has an appointment with Dr. Allyson Sabal in 4 weeks, so we will just up-titrate his medication at that time, assuming all is well.  Pt understands to call if develops symptoms of fatigue/lethargy that do not resolve after 7-10 days at increased dose.

## 2015-01-24 NOTE — Patient Instructions (Signed)
Increase carvedilol to 12.5 mg twice daily - take 2 of the 6.25 mg tablets twice daily until they are gone  Continue to take all other medications  Good job on the weight loss.  Continue to eat well.    Return in 3 weeks for dose titration

## 2015-01-24 NOTE — Progress Notes (Signed)
     01/24/2015 Marc Potter Feb 01, 1946 [id number]   HPI:  Marc Potter is a 69 y.o. male patient of Dr Allyson Sabal with a PMH below who presents today for medication titration.   His cardiac history is significant for LV dysfunction with EF 30%, hyperlipidemia,  and atrial fibrillation.  He saw Dr. Allyson Sabal on July 27 and was switched from diltiazem to carvedilol 6.25 mg bid.  Today he reports no problems with the change in medications and actually feels as though he has a little more energy.   He reports no SOB, edema, fatigue, loss of appetite or noticeable increase in heart rate.  He is compliant with his medications.   His weight is down 5 pounds in the past 2 weeks.  He was started on Savaysa 60 mg qd for his atrial fibrillation, for which his insurance company will not cover.  We have given him samples again today that will last him another 6 weeks.  I have told him at that time, we may just want to switch to Xarelto, another once daily factor Xa inhibitor that will probably be covered.      Current Outpatient Prescriptions  Medication Sig Dispense Refill  . aspirin 81 MG tablet Take 81 mg by mouth daily.    . calcium carbonate (OS-CAL) 600 MG TABS tablet Take 600 mg by mouth daily.     . carvedilol (COREG) 6.25 MG tablet Take 1 tablet (6.25 mg total) by mouth 2 (two) times daily. 60 tablet 11  . Coenzyme Q10 (CO Q-10) 100 MG CAPS Take 1 tablet by mouth daily.    . Cyanocobalamin (VITAMELTS ENERGY VITAMIN B-12 PO) Take 1 tablet by mouth daily.    [name] edoxaban (SAVAYSA) 60 MG TABS tablet Take 60 mg by mouth daily. 90 tablet 3  . glucosamine-chondroitin 500-400 MG tablet Take 1 tablet by mouth daily.    . multivitamin-lutein (OCUVITE-LUTEIN) CAPS capsule Take 1 capsule by mouth daily.    [name] thyroid (ARMOUR) 90 MG tablet Take 90 mg by mouth daily.     No current facility-administered medications for this visit.    No Known Allergies  Past Medical History  Diagnosis Date  . Heart  palpitations   . Hypertension   . Hyperlipidemia   . Atrial fibrillation   . Left ventricular dysfunction   . Normal coronary arteries     by outpatient diagnostic coronary arteriography performed by myself 12/24/14.    Blood pressure 118/56, pulse 64, height 6' (1.829 m), weight 192 lb 1.6 oz (87.136 kg).    [name] PharmD CPP [facility]

## 2015-02-19 ENCOUNTER — Encounter: Payer: Self-pay | Admitting: Cardiovascular Disease

## 2015-02-19 ENCOUNTER — Ambulatory Visit (INDEPENDENT_AMBULATORY_CARE_PROVIDER_SITE_OTHER): Payer: Medicare Other | Admitting: Cardiovascular Disease

## 2015-02-19 VITALS — BP 126/82 | HR 72 | Ht 73.0 in | Wt 187.0 lb

## 2015-02-19 DIAGNOSIS — I482 Chronic atrial fibrillation, unspecified: Secondary | ICD-10-CM

## 2015-02-19 DIAGNOSIS — E785 Hyperlipidemia, unspecified: Secondary | ICD-10-CM

## 2015-02-19 DIAGNOSIS — I10 Essential (primary) hypertension: Secondary | ICD-10-CM | POA: Diagnosis not present

## 2015-02-19 DIAGNOSIS — Z79899 Other long term (current) drug therapy: Secondary | ICD-10-CM

## 2015-02-19 DIAGNOSIS — I519 Heart disease, unspecified: Secondary | ICD-10-CM

## 2015-02-19 MED ORDER — EDOXABAN TOSYLATE 60 MG PO TABS: 60.0000 mg | ORAL_TABLET | Freq: Every day | ORAL | Status: DC

## 2015-02-19 MED ORDER — LISINOPRIL 5 MG PO TABS: 5.0000 mg | ORAL_TABLET | Freq: Every day | ORAL | Status: DC

## 2015-02-19 NOTE — Patient Instructions (Signed)
Medication Instructions:   Start Lisinopril 5mg  daily  Labwork:  Your physician recommends that you return for lab work in: 3 weeks   Testing/Procedures:   Echocardiogram. Echocardiography is a painless test that uses sound waves to create images of your heart. It provides your doctor with information about the size and shape of your heart and how well your heart's chambers and valves are working. This procedure takes approximately one hour. There are no restrictions for this procedure. This test is performed at [address]- 3rd floor.     Follow-Up:  1 month with Belenda Cruise, our pharmacist, for medication management and blood pressure review 6 months with Dr Allyson Sabal  Any Other Special Instructions Will Be Listed Below (If Applicable).

## 2015-02-19 NOTE — Assessment & Plan Note (Addendum)
History of severe LV dysfunction but by 2-D echo in June and chronic catheterization in July with an EF in the 25-30% range. His medicines were adjusted and he was placed on carvedilol titrated up to a dose of 12.5 mg by mouth twice a day. His heart rate is in the 70s and he feels clinically improved.it appears that he is not on ACE inhibitor which I'm going to start. I'm going to recheck a 2-D echocardiogram for LV function. Should be noted that I performed cardiac catheterization on him 12/24/14 the right radial approach revealing normal coronary arteries.

## 2015-02-19 NOTE — Progress Notes (Signed)
02/19/2015 [name]   [birth year]  [id number]  Primary Physician Nonnie Done., MD Primary Cardiologist: Runell Gess MD Roseanne Reno   HPI:  Mr. Debruyn is a 69 year old moderately overweight married Caucasian male father of one biologic and 2 stepchildren, grandfather and 6 grandchildren whose mother-in-law, [name] , is a patient of mine as well. I last saw him in the office 01/09/15 .He has history of hypertension and hyperlipidemia. He is a normal 2-D echo Myoview in the past. He had PAF on event monitoring she was symptomatic from it at that point several years ago I elected to begin him on aspirin alone. He does admit to mild increasing dyspnea on exertion but denies chest pain. He underwent outpatient diagnostic coronary arteriography by myself on 12/24/14 via the right radial approach revealing normal coronary arteries and severe LV dysfunction with an EF of 30%. His dyspnea has significantly improved. He remains in A. Fib with a ventricular response in the 70s today. He is on Savaysa oral anticoagulation. His carvedilol was titrated to 12.5 mg by mouth twice a day.   Current Outpatient Prescriptions  Medication Sig Dispense Refill  . aspirin 81 MG tablet Take 81 mg by mouth daily.    . calcium carbonate (OS-CAL) 600 MG TABS tablet Take 600 mg by mouth daily.     . carvedilol (COREG) 12.5 MG tablet Take 1 tablet (12.5 mg total) by mouth 2 (two) times daily. 60 tablet 1  . Coenzyme Q10 (CO Q-10) 100 MG CAPS Take 1 tablet by mouth daily.    . Cyanocobalamin (VITAMELTS ENERGY VITAMIN B-12 PO) Take 1,500 mcg by mouth daily.     [name] edoxaban (SAVAYSA) 60 MG TABS tablet Take 60 mg by mouth daily. 90 tablet 3  . glucosamine-chondroitin 500-400 MG tablet Take 1 tablet by mouth daily.    . multivitamin-lutein (OCUVITE-LUTEIN) CAPS capsule Take 1 capsule by mouth daily.    [name] thyroid (ARMOUR) 90 MG tablet Take 90 mg by mouth daily.     No current  facility-administered medications for this visit.    No Known Allergies  Social History   Social History  . Marital Status: Married    Spouse Name: N/A  . Number of Children: N/A  . Years of Education: N/A   Occupational History  . Not on file.   Social History Main Topics  . Smoking status: Former Games developer  . Smokeless tobacco: Not on file  . Alcohol Use: Yes     Comment: DRINKS ON OCCASION   . Drug Use: No  . Sexual Activity: Not on file   Other Topics Concern  . Not on file   Social History Narrative     Review of Systems: General: negative for chills, fever, night sweats or weight changes.  Cardiovascular: negative for chest pain, dyspnea on exertion, edema, orthopnea, palpitations, paroxysmal nocturnal dyspnea or shortness of breath Dermatological: negative for rash Respiratory: negative for cough or wheezing Urologic: negative for hematuria Abdominal: negative for nausea, vomiting, diarrhea, bright red blood per rectum, melena, or hematemesis Neurologic: negative for visual changes, syncope, or dizziness All other systems reviewed and are otherwise negative except as noted above.    Blood pressure 126/82, pulse 72, height 6\' 1"  (1.854 m), weight 187 lb (84.823 kg).  General appearance: alert and no distress Neck: no adenopathy, no carotid bruit, no JVD, supple, symmetrical, trachea midline and thyroid not enlarged, symmetric, no tenderness/mass/nodules Lungs: clear to auscultation bilaterally Heart: regularly irregular rhythm Extremities:  extremities normal, atraumatic, no cyanosis or edema Pulses: 2+ and symmetric Skin: Skin color, texture, turgor normal. No rashes or lesions Neurologic: Grossly normal  EKG not performed today  ASSESSMENT AND PLAN:   Left ventricular dysfunction History of severe LV dysfunction but by 2-D echo in June and chronic catheterization in July with an EF in the 25-30% range. His medicines were adjusted and he was placed on  carvedilol titrated up to a dose of 12.5 mg by mouth twice a day. His heart rate is in the 70s and he feels clinically improved.it appears that he is not on ACE inhibitor which I'm going to start. I'm going to recheck a 2-D echocardiogram for LV function. Should be noted that I performed cardiac catheterization on him 12/24/14 the right radial approach revealing normal coronary arteries.  Hyperlipidemia History of hyperlipidemia with recent cholesterol checked 12/18/14 revealing total cholesterol 164, LDL 79 and HDL 43  Essential hypertension History of hypertension blood pressure measured at 126/82. He is on carvedilol. Continue current meds at current  Atrial fibrillation History of chronic A. Fib on Savaysa oral anticoagulation rate controlled and currently asymptomatic.      Runell Gess MD FACP,FACC,FAHA, [facility] 02/19/2015 12:25 PM

## 2015-02-19 NOTE — Assessment & Plan Note (Signed)
History of chronic A. Fib on Savaysa oral anticoagulation rate controlled and currently asymptomatic.

## 2015-02-19 NOTE — Assessment & Plan Note (Signed)
History of hyperlipidemia with recent cholesterol checked 12/18/14 revealing total cholesterol 164, LDL 79 and HDL 43

## 2015-02-19 NOTE — Assessment & Plan Note (Signed)
History of hypertension blood pressure measured at 126/82. He is on carvedilol. Continue current meds at current

## 2015-03-04 ENCOUNTER — Ambulatory Visit (HOSPITAL_COMMUNITY): Payer: Medicare Other | Attending: Internal Medicine

## 2015-03-04 ENCOUNTER — Other Ambulatory Visit: Payer: Self-pay

## 2015-03-04 DIAGNOSIS — I519 Heart disease, unspecified: Secondary | ICD-10-CM | POA: Insufficient documentation

## 2015-03-04 DIAGNOSIS — I10 Essential (primary) hypertension: Secondary | ICD-10-CM | POA: Diagnosis not present

## 2015-03-04 DIAGNOSIS — E785 Hyperlipidemia, unspecified: Secondary | ICD-10-CM | POA: Diagnosis not present

## 2015-03-04 DIAGNOSIS — I340 Nonrheumatic mitral (valve) insufficiency: Secondary | ICD-10-CM | POA: Diagnosis not present

## 2015-03-04 DIAGNOSIS — I517 Cardiomegaly: Secondary | ICD-10-CM | POA: Diagnosis not present

## 2015-03-08 DIAGNOSIS — Z8042 Family history of malignant neoplasm of prostate: Secondary | ICD-10-CM | POA: Diagnosis not present

## 2015-03-08 DIAGNOSIS — R972 Elevated prostate specific antigen [PSA]: Secondary | ICD-10-CM | POA: Diagnosis not present

## 2015-03-08 DIAGNOSIS — N400 Enlarged prostate without lower urinary tract symptoms: Secondary | ICD-10-CM | POA: Diagnosis not present

## 2015-03-12 DIAGNOSIS — E785 Hyperlipidemia, unspecified: Secondary | ICD-10-CM | POA: Diagnosis not present

## 2015-03-12 DIAGNOSIS — I482 Chronic atrial fibrillation: Secondary | ICD-10-CM | POA: Diagnosis not present

## 2015-03-12 DIAGNOSIS — I519 Heart disease, unspecified: Secondary | ICD-10-CM | POA: Diagnosis not present

## 2015-03-12 DIAGNOSIS — I10 Essential (primary) hypertension: Secondary | ICD-10-CM | POA: Diagnosis not present

## 2015-03-12 DIAGNOSIS — I4891 Unspecified atrial fibrillation: Secondary | ICD-10-CM | POA: Diagnosis not present

## 2015-03-12 DIAGNOSIS — Z79899 Other long term (current) drug therapy: Secondary | ICD-10-CM | POA: Diagnosis not present

## 2015-03-13 LAB — BASIC METABOLIC PANEL
BUN: 16 mg/dL (ref 7–25)
CO2: 27 mmol/L (ref 20–31)
Calcium: 9.2 mg/dL (ref 8.6–10.3)
Chloride: 108 mmol/L (ref 98–110)
Creat: 0.77 mg/dL (ref 0.70–1.25)
Glucose, Bld: 106 mg/dL — ABNORMAL HIGH (ref 65–99)
Potassium: 4.4 mmol/L (ref 3.5–5.3)
Sodium: 140 mmol/L (ref 135–146)

## 2015-03-21 ENCOUNTER — Encounter: Payer: Self-pay | Admitting: Pharmacist Clinician (PhC)/ Clinical Pharmacy Specialist

## 2015-03-21 ENCOUNTER — Ambulatory Visit (INDEPENDENT_AMBULATORY_CARE_PROVIDER_SITE_OTHER): Payer: Medicare Other | Admitting: Pharmacist Clinician (PhC)/ Clinical Pharmacy Specialist

## 2015-03-21 VITALS — BP 128/78 | HR 68 | Ht 73.0 in | Wt 179.8 lb

## 2015-03-21 DIAGNOSIS — I519 Heart disease, unspecified: Secondary | ICD-10-CM | POA: Diagnosis not present

## 2015-03-21 MED ORDER — CARVEDILOL 25 MG PO TABS: 25.0000 mg | ORAL_TABLET | Freq: Two times a day (BID) | ORAL | Status: DC

## 2015-03-21 NOTE — Patient Instructions (Signed)
Increase carvedilol to 25 mg twice daily.  Continue lisinopril and all other medications at their current doses.   Call if you have any problems with the increase in dose of carvedilol.

## 2015-03-22 ENCOUNTER — Other Ambulatory Visit: Payer: Self-pay | Admitting: Cardiovascular Disease

## 2015-03-22 ENCOUNTER — Encounter: Payer: Self-pay | Admitting: Pharmacist Clinician (PhC)/ Clinical Pharmacy Specialist

## 2015-03-22 NOTE — Assessment & Plan Note (Signed)
Pt is doing well with carvedilol. Today we will increase the dose to 25 mg bid. Pt understands to call if develops symptoms of fatigue/lethargy that do not resolve after 7-10 days at increased dose. He is scheduled to see Dr. Allyson Sabal in November.

## 2015-03-22 NOTE — Progress Notes (Signed)
     03/22/2015 Edem Thomasson 01-Aug-1945 [id number]   HPI:  Marc Potter is a 69 y.o. male patient of Dr Allyson Sabal with a PMH below who presents today for medication titration.   His cardiac history is significant for LV dysfunction with EF 30%, hyperlipidemia,  and atrial fibrillation.  He has seen both Dr Allyson Sabal and myself for titration of carvedilol, is now doing fine with 12.6 mg bid.  Today he reports no problems with the change in medications and continues to feel stronger each day.   He reports no SOB, edema, fatigue, loss of appetite or noticeable increase in heart rate.  He is compliant with his medications.   His weight continues to drop and is now down 24 pounds since June.   Current Outpatient Prescriptions  Medication Sig Dispense Refill  . aspirin 81 MG tablet Take 81 mg by mouth daily.    . calcium carbonate (OS-CAL) 600 MG TABS tablet Take 600 mg by mouth daily.     . carvedilol (COREG) 25 MG tablet Take 1 tablet (25 mg total) by mouth 2 (two) times daily. 60 tablet 3  . Coenzyme Q10 (CO Q-10) 100 MG CAPS Take 1 tablet by mouth daily.    . Cyanocobalamin (VITAMELTS ENERGY VITAMIN B-12 PO) Take 1,500 mcg by mouth daily.     [name] edoxaban (SAVAYSA) 60 MG TABS tablet Take 60 mg by mouth daily. 35 tablet 0  . glucosamine-chondroitin 500-400 MG tablet Take 1 tablet by mouth daily.    [name] lisinopril (PRINIVIL,ZESTRIL) 5 MG tablet Take 1 tablet (5 mg total) by mouth daily. 90 tablet 3  . multivitamin-lutein (OCUVITE-LUTEIN) CAPS capsule Take 1 capsule by mouth daily.    [name] thyroid (ARMOUR) 90 MG tablet Take 90 mg by mouth daily.     No current facility-administered medications for this visit.    No Known Allergies  Past Medical History  Diagnosis Date  . Heart palpitations   . Hypertension   . Hyperlipidemia   . Atrial fibrillation (HCC)   . Left ventricular dysfunction   . Normal coronary arteries     by outpatient diagnostic coronary arteriography performed by myself  12/24/14.    Blood pressure 128/78, pulse 68, height 6\' 1"  (1.854 m), weight 179 lb 12.8 oz (81.557 kg).    [name] PharmD CPP [facility]

## 2015-04-16 ENCOUNTER — Ambulatory Visit: Payer: Medicare Other | Admitting: Cardiovascular Disease

## 2015-04-17 ENCOUNTER — Encounter: Payer: Self-pay | Admitting: Cardiovascular Disease

## 2015-04-17 ENCOUNTER — Ambulatory Visit (INDEPENDENT_AMBULATORY_CARE_PROVIDER_SITE_OTHER): Payer: Medicare Other | Admitting: Cardiovascular Disease

## 2015-04-17 DIAGNOSIS — I482 Chronic atrial fibrillation, unspecified: Secondary | ICD-10-CM

## 2015-04-17 DIAGNOSIS — Z23 Encounter for immunization: Secondary | ICD-10-CM | POA: Diagnosis not present

## 2015-04-17 DIAGNOSIS — I519 Heart disease, unspecified: Secondary | ICD-10-CM

## 2015-04-17 DIAGNOSIS — Z79899 Other long term (current) drug therapy: Secondary | ICD-10-CM

## 2015-04-17 DIAGNOSIS — I10 Essential (primary) hypertension: Secondary | ICD-10-CM

## 2015-04-17 DIAGNOSIS — E785 Hyperlipidemia, unspecified: Secondary | ICD-10-CM | POA: Diagnosis not present

## 2015-04-17 MED ORDER — LISINOPRIL 5 MG PO TABS: 5.0000 mg | ORAL_TABLET | Freq: Every day | ORAL | Status: DC

## 2015-04-17 MED ORDER — CARVEDILOL 25 MG PO TABS: 25.0000 mg | ORAL_TABLET | Freq: Two times a day (BID) | ORAL | Status: DC

## 2015-04-17 NOTE — Assessment & Plan Note (Signed)
History of hypertension with blood pressure measures 120/70. He is on carvedilol and lisinopril. Continue current meds at current dosing

## 2015-04-17 NOTE — Progress Notes (Signed)
04/17/2015 Marc Potter   1945-10-01  [id number]  Primary Physician Marc Done., MD Primary Cardiologist: Marc Gess MD Marc Potter  HPI: Marc Potter is a 69 year old moderately overweight married Caucasian male father of one biologic and 2 stepchildren, grandfather and 6 grandchildren whose mother-in-law, Marc Potter , is a patient of mine as well. I last saw him in the office 02/19/15 .He has history of hypertension and hyperlipidemia. He is a normal 2-D echo Myoview in the past. He had PAF on event monitoring she was symptomatic from it at that point several years ago I elected to begin him on aspirin alone. He does admit to mild increasing dyspnea on exertion but denies chest pain. He underwent outpatient diagnostic coronary arteriography by myself on 12/24/14 via the right radial approach revealing normal coronary arteries and severe LV dysfunction with an EF of 30%. His dyspnea has significantly improved. He remains in A. Fib with a ventricular response in the 70s today. He is on Savaysa oral anticoagulation. His carvedilol was titrated to 12.5 mg by mouth twice a day and ultimately up to 25 mg twice a day with the addition of lisinopril as well. Recent 2-D echo performed 03/04/15 revealed a persistent diminished EF in the 25-30% range although he is not symptomatic.   Current Outpatient Prescriptions  Medication Sig Dispense Refill  . aspirin 81 MG tablet Take 81 mg by mouth daily.    . calcium carbonate (OS-CAL) 600 MG TABS tablet Take 600 mg by mouth daily.     . carvedilol (COREG) 25 MG tablet Take 1 tablet (25 mg total) by mouth 2 (two) times daily. 180 tablet 3  . Coenzyme Q10 (CO Q-10) 100 MG CAPS Take 1 tablet by mouth daily.    . Cyanocobalamin (VITAMELTS ENERGY VITAMIN B-12 PO) Take 1,500 mcg by mouth daily.     [name] edoxaban (SAVAYSA) 60 MG TABS tablet Take 60 mg by mouth daily. 35 tablet 0  . glucosamine-chondroitin 500-400 MG tablet Take 1 tablet by  mouth daily.    [name] lisinopril (PRINIVIL,ZESTRIL) 5 MG tablet Take 1 tablet (5 mg total) by mouth daily. 90 tablet 3  . multivitamin-lutein (OCUVITE-LUTEIN) CAPS capsule Take 1 capsule by mouth daily.    [name] thyroid (ARMOUR) 90 MG tablet Take 90 mg by mouth daily.     No current facility-administered medications for this visit.    No Known Allergies  Social History   Social History  . Marital Status: Married    Spouse Name: N/A  . Number of Children: N/A  . Years of Education: N/A   Occupational History  . Not on file.   Social History Main Topics  . Smoking status: Former Games developer  . Smokeless tobacco: Not on file  . Alcohol Use: Yes     Comment: DRINKS ON OCCASION   . Drug Use: No  . Sexual Activity: Not on file   Other Topics Concern  . Not on file   Social History Narrative     Review of Systems: General: negative for chills, fever, night sweats or weight changes.  Cardiovascular: negative for chest pain, dyspnea on exertion, edema, orthopnea, palpitations, paroxysmal nocturnal dyspnea or shortness of breath Dermatological: negative for rash Respiratory: negative for cough or wheezing Urologic: negative for hematuria Abdominal: negative for nausea, vomiting, diarrhea, bright red blood per rectum, melena, or hematemesis Neurologic: negative for visual changes, syncope, or dizziness All other systems reviewed and are otherwise negative except as noted above.  Blood pressure 120/70, pulse 70, height 6' (1.829 m), weight 180 lb (81.647 kg).  General appearance: alert and no distress Neck: no adenopathy, no carotid bruit, no JVD, supple, symmetrical, trachea midline and thyroid not enlarged, symmetric, no tenderness/mass/nodules Lungs: clear to auscultation bilaterally Heart: irregularly irregular rhythm Extremities: extremities normal, atraumatic, no cyanosis or edema  EKG not performed today  ASSESSMENT AND PLAN:   Left ventricular dysfunction Marc Potter  returns today for follow-up of his 2-D echocardiogram performed 03/04/15 which revealed ejection fraction of 25-30% with a mildly dilated left ventricle and a moderately dilated left atrium. He is on optimal medical therapy. He has no symptoms of heart failure. We had a discussion regarding ICD therapy for primary prevention. I'm referring him to Marc Potter to discuss this.  Hyperlipidemia History of hyperlipidemia not on statin therapy. His most recent lipid profile performed 12/18/14 revealed total cholesterol 164, LDL 79 HDL 43.  Essential hypertension History of hypertension with blood pressure measures 120/70. He is on carvedilol and lisinopril. Continue current meds at current dosing  Atrial fibrillation History of chronic H fibrillation, rate controlled on Savaysa  oral anticoagulation.      Marc Gess MD FACP,FACC,FAHA, [facility] 04/17/2015 9:54 AM

## 2015-04-17 NOTE — Patient Instructions (Signed)
Medication Instructions:  Your physician recommends that you continue on your current medications as directed. Please refer to the Current Medication list given to you today.   Labwork: none  Testing/Procedures: none  Follow-Up: You have been referred to see Dr. Royann Shivers to discuss possible ICD placement.   Your physician wants you to follow-up in: 6 months with Dr. Allyson Sabal. You will receive a reminder letter in the mail two months in advance. If you don't receive a letter, please call our office to schedule the follow-up appointment.   Any Other Special Instructions Will Be Listed Below (If Applicable).     If you need a refill on your cardiac medications before your next appointment, please call your pharmacy.

## 2015-04-17 NOTE — Assessment & Plan Note (Signed)
History of hyperlipidemia not on statin therapy. His most recent lipid profile performed 12/18/14 revealed total cholesterol 164, LDL 79 HDL 43.

## 2015-04-17 NOTE — Assessment & Plan Note (Signed)
History of chronic H fibrillation, rate controlled on Savaysa  oral anticoagulation.

## 2015-04-17 NOTE — Assessment & Plan Note (Signed)
Mr. Fawcett returns today for follow-up of his 2-D echocardiogram performed 03/04/15 which revealed ejection fraction of 25-30% with a mildly dilated left ventricle and a moderately dilated left atrium. He is on optimal medical therapy. He has no symptoms of heart failure. We had a discussion regarding ICD therapy for primary prevention. I'm referring him to Dr. Royann Shivers to discuss this.

## 2015-04-25 ENCOUNTER — Telehealth: Payer: Self-pay

## 2015-04-25 NOTE — Telephone Encounter (Signed)
Left message for pt that Savaysa 60 mg tablet samples were left at front desk for him.

## 2015-05-06 ENCOUNTER — Ambulatory Visit: Payer: Medicare Other | Admitting: Cardiovascular Disease

## 2015-05-07 ENCOUNTER — Encounter: Payer: Self-pay | Admitting: Cardiovascular Disease

## 2015-05-07 ENCOUNTER — Ambulatory Visit (INDEPENDENT_AMBULATORY_CARE_PROVIDER_SITE_OTHER): Payer: Medicare Other | Admitting: Cardiovascular Disease

## 2015-05-07 VITALS — BP 116/66 | HR 87 | Resp 16 | Ht 72.0 in | Wt 180.7 lb

## 2015-05-07 DIAGNOSIS — Z79899 Other long term (current) drug therapy: Secondary | ICD-10-CM

## 2015-05-07 DIAGNOSIS — I428 Other cardiomyopathies: Secondary | ICD-10-CM

## 2015-05-07 DIAGNOSIS — I482 Chronic atrial fibrillation, unspecified: Secondary | ICD-10-CM

## 2015-05-07 DIAGNOSIS — Z9189 Other specified personal risk factors, not elsewhere classified: Secondary | ICD-10-CM

## 2015-05-07 DIAGNOSIS — I429 Cardiomyopathy, unspecified: Secondary | ICD-10-CM

## 2015-05-07 DIAGNOSIS — I10 Essential (primary) hypertension: Secondary | ICD-10-CM

## 2015-05-07 DIAGNOSIS — I519 Heart disease, unspecified: Secondary | ICD-10-CM

## 2015-05-07 DIAGNOSIS — E785 Hyperlipidemia, unspecified: Secondary | ICD-10-CM

## 2015-05-07 MED ORDER — LISINOPRIL 10 MG PO TABS: 10.0000 mg | ORAL_TABLET | Freq: Every day | ORAL | Status: DC

## 2015-05-07 NOTE — Patient Instructions (Signed)
Dr Royann Shivers has recommended making the following medication changes: INCREASE Lisinopril 10 mg - you may take 2 tablets (10 mg total) of the 5 mg until that bottle runs out.   >>A new prescription, with updated instructions, has been sent to your pharmacy electronically.  Your physician has recommended that you have a defibrillator inserted on DECEMBER 20th, 2016. An implantable cardioverter defibrillator (ICD) is a small device that is placed in your chest or, in rare cases, your abdomen. This device uses electrical pulses or shocks to help control life-threatening, irregular heartbeats that could lead the heart to suddenly stop beating (sudden cardiac arrest). Leads are attached to the ICD that goes into your heart. This is done in the hospital and usually requires an overnight stay. Please see the instruction sheet given to you today for more information.  >>You will have to have blood work done prior to the procedure.  >>DO NOT TAKE YOUR SAVAYSA ON DECEMBER 19th, 2016.

## 2015-05-08 ENCOUNTER — Telehealth: Payer: Self-pay | Admitting: *Deleted

## 2015-05-08 ENCOUNTER — Encounter: Payer: Self-pay | Admitting: Cardiovascular Disease

## 2015-05-08 ENCOUNTER — Other Ambulatory Visit: Payer: Self-pay | Admitting: *Deleted

## 2015-05-08 DIAGNOSIS — Z9189 Other specified personal risk factors, not elsewhere classified: Secondary | ICD-10-CM | POA: Insufficient documentation

## 2015-05-08 DIAGNOSIS — I428 Other cardiomyopathies: Secondary | ICD-10-CM

## 2015-05-08 NOTE — Telephone Encounter (Signed)
Patient called and he did remember to stop the aspirin.

## 2015-05-08 NOTE — Telephone Encounter (Signed)
-----   Message from Thurmon Fair, MD sent at 05/08/2015 11:48 AM EST ----- Please tell him to stop taking aspirin permanently. I discussed this with him in the office visit

## 2015-05-08 NOTE — Progress Notes (Signed)
Patient ID: Marc Potter, male   DOB: [birth year], 69 y.o.   MRN: [id number]     Cardiology Office Note   Date:  05/08/2015   ID:  Marc Potter, DOB 1946/03/15, MRN [id number]  PCP:  Nonnie Done., MD  Cardiologist:  [name], M.D.; Thurmon Fair, MD   Chief Complaint  Patient presents with  . New Evaluation    discuss ICD implant per Dr Allyson Sabal. patient reports no complaints. shortness of breath has resolved.      History of Present Illness: Marc Potter is a 69 y.o. male who presents for discussion of implantable defibrillator therapy. He has nonischemic cardiomyopathy (normal coronary arteries by angiography July 2016). Left ventricular ejection fraction in June was only 25-30 percent and after 3 months of medical therapy his echocardiogram in September did not show any improvement. He is on maximum usual carvedilol dose and is on a relatively low dose of ACE inhibitor, possibly due to low blood pressure. He has permanent atrial fibrillation that has been well rate controlled and is on chronic anticoagulation with a direct oral anticoagulant he is also taking aspirin, but does not have known vascular disease.  He felt extremely poorly 1 heart failure was initially diagnosed, but now has NYHA functional class II. He does not have any edema and is not taking any diuretics.  Past Medical History  Diagnosis Date  . Heart palpitations   . Hypertension   . Hyperlipidemia   . Atrial fibrillation (HCC)   . Left ventricular dysfunction   . Normal coronary arteries     by outpatient diagnostic coronary arteriography performed by myself 12/24/14.    Past Surgical History  Procedure Laterality Date  . Nm myoview ltd  2012  . Doppler echocardiography  2012  . Cardiac catheterization N/A 12/24/2014    Procedure: Left Heart Cath and Coronary Angiography;  Surgeon: Runell Gess, MD;  Location: [facility] INVASIVE CV LAB;  Service: Cardiovascular;  Laterality: N/A;     Current  Outpatient Prescriptions  Medication Sig Dispense Refill  . aspirin 81 MG tablet Take 81 mg by mouth daily.    . carvedilol (COREG) 25 MG tablet Take 1 tablet (25 mg total) by mouth 2 (two) times daily. 180 tablet 3  . Coenzyme Q10 (CO Q-10) 100 MG CAPS Take 1 tablet by mouth daily.    [name] edoxaban (SAVAYSA) 60 MG TABS tablet Take 60 mg by mouth daily. 35 tablet 0  . lisinopril (PRINIVIL,ZESTRIL) 10 MG tablet Take 1 tablet (10 mg total) by mouth daily. 90 tablet 3  . Multiple Vitamin (MULTIVITAMIN) tablet Take 1 tablet by mouth daily.    . multivitamin-lutein (OCUVITE-LUTEIN) CAPS capsule Take 1 capsule by mouth daily.    [name] thyroid (ARMOUR) 90 MG tablet Take 90 mg by mouth daily.     No current facility-administered medications for this visit.    Allergies:   Review of patient's allergies indicates no known allergies.    Social History:  The patient  reports that he has quit smoking. He does not have any smokeless tobacco history on file. He reports that he drinks alcohol. He reports that he does not use illicit drugs.   Family History:  The patient's family history includes Cancer in his father; Diabetes in his mother; Heart failure in his father; Hypertension in his mother.    ROS:  Please see the history of present illness.    Otherwise, review of systems positive for none.   All other systems are reviewed and  negative.    PHYSICAL EXAM: VS:  Pulse 87  Ht 6' (1.829 m)  Wt 180 lb 11.2 oz (81.965 kg)  BMI 24.50 kg/m2 , BMI Body mass index is 24.5 kg/(m^2).  General: Alert, oriented x3, no distress Head: no evidence of trauma, PERRL, EOMI, no exophtalmos or lid lag, no myxedema, no xanthelasma; normal ears, nose and oropharynx Neck: normal jugular venous pulsations and no hepatojugular reflux; brisk carotid pulses without delay and no carotid bruits Chest: clear to auscultation, no signs of consolidation by percussion or palpation, normal fremitus, symmetrical and full respiratory  excursions Cardiovascular: normal position and quality of the apical impulse, irregular rhythm, normal first and second heart sounds, no murmurs, rubs or gallops Abdomen: no tenderness or distention, no masses by palpation, no abnormal pulsatility or arterial bruits, normal bowel sounds, no hepatosplenomegaly Extremities: no clubbing, cyanosis or edema; 2+ radial, ulnar and brachial pulses bilaterally; 2+ right femoral, posterior tibial and dorsalis pedis pulses; 2+ left femoral, posterior tibial and dorsalis pedis pulses; no subclavian or femoral bruits Neurological: grossly nonfocal Psych: euthymic mood, full affect   EKG:  EKG is ordered today. The ekg ordered today demonstrates atrial fibrillation, minor intraventricular conduction delay with QRS duration 114 ms, inferolateral ST depression and T-wave inversion, unchanged from previous tracings, QTC 445 ms   Recent Labs: 12/18/2014: ALT 25; Hemoglobin 15.3; Platelets 192; TSH 0.943 03/12/2015: BUN 16; Creat 0.77; Potassium 4.4; Sodium 140    Lipid Panel    Component Value Date/Time   CHOL 164 12/18/2014 0959   TRIG 209* 12/18/2014 0959   HDL 43 12/18/2014 0959   CHOLHDL 3.8 12/18/2014 0959   VLDL 42* 12/18/2014 0959   LDLCALC 79 12/18/2014 0959      Wt Readings from Last 3 Encounters:  05/07/15 180 lb 11.2 oz (81.965 kg)  04/17/15 180 lb (81.647 kg)  03/21/15 179 lb 12.8 oz (81.557 kg)     ASSESSMENT AND PLAN:  Marc Potter meets [facility] for Primary prevention ICD implantation for non ischemic cardiomyopathy (left ventricular ejection fraction under 35%, heart failure NYHA class II-III, on comprehensive medical therapy > 3 months).  Discussed the purpose of defibrillators, pros and cons of the device, and details of the implantation procedure and possible immediate complications (infection, pneumothorax, perforation, bleeding, the dislodgment, reoperation, etc.) as well as possible long-term issues of unnecessary shocks,  softer/hardware malfunction, need for reoperation. Also reviewed activity restrictions, expected generator longevity and follow-up. Discussed the fact that this is a device that may prolong his life, but will have no impact on his symptoms will not improve his quality of life. Also reviewed the fact that only roughly one out of every 3 implanted defibrillators for his indication will ever deliver therapy.  The patient and his wife asked numerous questions and we had at least a 30 minute discussion.  I think there is room to try to improve ACE inhibitor therapy and will increase the dose of lisinopril to 10 mg today. I am doubtful that this will lead to any further improvement in LV function.  While he does have a minor intraventricular conduction delay, he is unlikely to benefit from cardiac resynchronization therapy.  He has chronic atrial fibrillation but a relatively low embolic risk (CHADSVasc score is 2). Briefly hold his anticoagulant for the surgical procedure.  Current medicines are reviewed at length with the patient today.  The patient does not have concerns regarding medicines.  The following changes have been made:  Increase lisinopril to 10 mg  daily  Labs/ tests ordered today include:  Orders Placed This Encounter  Procedures  . APTT  . Protime-INR  . CBC  . Comprehensive metabolic panel  . EKG 12-Lead  . IMPLANTABLE CARDIOVERTER DEFIBRILLATOR IMPLANT     Patient Instructions  Dr Royann Shivers has recommended making the following medication changes: INCREASE Lisinopril 10 mg - you may take 2 tablets (10 mg total) of the 5 mg until that bottle runs out.   >>A new prescription, with updated instructions, has been sent to your pharmacy electronically.  Your physician has recommended that you have a defibrillator inserted on DECEMBER 20th, 2016. An implantable cardioverter defibrillator (ICD) is a small device that is placed in your chest or, in rare cases, your abdomen. This  device uses electrical pulses or shocks to help control life-threatening, irregular heartbeats that could lead the heart to suddenly stop beating (sudden cardiac arrest). Leads are attached to the ICD that goes into your heart. This is done in the hospital and usually requires an overnight stay. Please see the instruction sheet given to you today for more information.  >>You will have to have blood work done prior to the procedure.  >>DO NOT TAKE YOUR SAVAYSA ON DECEMBER 19th, 2016.     Joie Bimler, MD  05/08/2015 11:46 AM    Thurmon Fair, MD, [facility] [phone] office [phone] pager

## 2015-05-13 ENCOUNTER — Ambulatory Visit: Payer: Medicare Other | Admitting: Cardiovascular Disease

## 2015-05-28 DIAGNOSIS — Z79899 Other long term (current) drug therapy: Secondary | ICD-10-CM | POA: Diagnosis not present

## 2015-05-28 LAB — CBC
HCT: 41.6 % (ref 39.0–52.0)
HEMOGLOBIN: 14.3 g/dL (ref 13.0–17.0)
MCH: 32 pg (ref 26.0–34.0)
MCHC: 34.4 g/dL (ref 30.0–36.0)
MCV: 93.1 fL (ref 78.0–100.0)
MPV: 11.1 fL (ref 8.6–12.4)
PLATELETS: 175 10*3/uL (ref 150–400)
RBC: 4.47 MIL/uL (ref 4.22–5.81)
RDW: 14.1 % (ref 11.5–15.5)
WBC: 4.6 10*3/uL (ref 4.0–10.5)

## 2015-05-28 LAB — APTT: aPTT: 36 seconds (ref 24–37)

## 2015-05-28 LAB — PROTIME-INR
INR: 1.2 (ref <1.50)
PROTHROMBIN TIME: 15.4 s — AB (ref 11.6–15.2)

## 2015-05-29 LAB — COMPREHENSIVE METABOLIC PANEL
ALBUMIN: 4.1 g/dL (ref 3.6–5.1)
ALT: 15 U/L (ref 9–46)
AST: 17 U/L (ref 10–35)
Alkaline Phosphatase: 52 U/L (ref 40–115)
BUN: 16 mg/dL (ref 7–25)
CO2: 24 mmol/L (ref 20–31)
CREATININE: 0.83 mg/dL (ref 0.70–1.25)
Calcium: 9.4 mg/dL (ref 8.6–10.3)
Chloride: 105 mmol/L (ref 98–110)
Glucose, Bld: 93 mg/dL (ref 65–99)
Potassium: 4.5 mmol/L (ref 3.5–5.3)
SODIUM: 141 mmol/L (ref 135–146)
TOTAL PROTEIN: 6.2 g/dL (ref 6.1–8.1)
Total Bilirubin: 0.8 mg/dL (ref 0.2–1.2)

## 2015-06-04 ENCOUNTER — Encounter (HOSPITAL_COMMUNITY): Payer: Self-pay | Admitting: General Practice

## 2015-06-04 ENCOUNTER — Encounter (HOSPITAL_COMMUNITY)
Admission: RE | Disposition: A | Discharge status: Home / Self Care | Payer: Medicare Other | Source: Ambulatory Visit | Attending: Cardiovascular Disease

## 2015-06-04 ENCOUNTER — Ambulatory Visit (HOSPITAL_COMMUNITY)
Admission: RE | Admit: 2015-06-04 | Discharge: 2015-06-05 | Disposition: A | Discharge status: Home / Self Care | Payer: Medicare Other | Source: Ambulatory Visit | Attending: Cardiovascular Disease | Admitting: Cardiovascular Disease

## 2015-06-04 DIAGNOSIS — Z4502 Encounter for adjustment and management of automatic implantable cardiac defibrillator: Secondary | ICD-10-CM | POA: Insufficient documentation

## 2015-06-04 DIAGNOSIS — Z87891 Personal history of nicotine dependence: Secondary | ICD-10-CM | POA: Diagnosis not present

## 2015-06-04 DIAGNOSIS — I429 Cardiomyopathy, unspecified: Secondary | ICD-10-CM | POA: Diagnosis not present

## 2015-06-04 DIAGNOSIS — I519 Heart disease, unspecified: Secondary | ICD-10-CM | POA: Diagnosis present

## 2015-06-04 DIAGNOSIS — E785 Hyperlipidemia, unspecified: Secondary | ICD-10-CM | POA: Diagnosis not present

## 2015-06-04 DIAGNOSIS — Z95810 Presence of automatic (implantable) cardiac defibrillator: Secondary | ICD-10-CM

## 2015-06-04 DIAGNOSIS — Z7982 Long term (current) use of aspirin: Secondary | ICD-10-CM | POA: Diagnosis not present

## 2015-06-04 DIAGNOSIS — I4891 Unspecified atrial fibrillation: Secondary | ICD-10-CM | POA: Diagnosis present

## 2015-06-04 DIAGNOSIS — Z7901 Long term (current) use of anticoagulants: Secondary | ICD-10-CM | POA: Insufficient documentation

## 2015-06-04 DIAGNOSIS — I447 Left bundle-branch block, unspecified: Secondary | ICD-10-CM | POA: Diagnosis not present

## 2015-06-04 DIAGNOSIS — I110 Hypertensive heart disease with heart failure: Secondary | ICD-10-CM | POA: Diagnosis not present

## 2015-06-04 DIAGNOSIS — I480 Paroxysmal atrial fibrillation: Secondary | ICD-10-CM | POA: Insufficient documentation

## 2015-06-04 DIAGNOSIS — I428 Other cardiomyopathies: Secondary | ICD-10-CM | POA: Insufficient documentation

## 2015-06-04 DIAGNOSIS — Z23 Encounter for immunization: Secondary | ICD-10-CM | POA: Insufficient documentation

## 2015-06-04 DIAGNOSIS — Z8249 Family history of ischemic heart disease and other diseases of the circulatory system: Secondary | ICD-10-CM | POA: Insufficient documentation

## 2015-06-04 DIAGNOSIS — I509 Heart failure, unspecified: Secondary | ICD-10-CM | POA: Diagnosis not present

## 2015-06-04 DIAGNOSIS — Z9189 Other specified personal risk factors, not elsewhere classified: Secondary | ICD-10-CM

## 2015-06-04 HISTORY — DX: Presence of automatic (implantable) cardiac defibrillator: Z95.810

## 2015-06-04 HISTORY — DX: Unspecified systolic (congestive) heart failure: I50.20

## 2015-06-04 HISTORY — PX: EP IMPLANTABLE DEVICE: SHX172B

## 2015-06-04 LAB — SURGICAL PCR SCREEN
MRSA, PCR: NEGATIVE
STAPHYLOCOCCUS AUREUS: NEGATIVE

## 2015-06-04 SURGERY — ICD IMPLANT

## 2015-06-04 MED ORDER — HEPARIN (PORCINE) IN NACL 2-0.9 UNIT/ML-% IJ SOLN
INTRAMUSCULAR | Status: AC
  Filled 2015-06-04: qty 500

## 2015-06-04 MED ORDER — FENTANYL CITRATE (PF) 100 MCG/2ML IJ SOLN
INTRAMUSCULAR | Status: DC | PRN
  Administered 2015-06-04 (×2): 25 ug via INTRAVENOUS

## 2015-06-04 MED ORDER — LIDOCAINE HCL (PF) 1 % IJ SOLN
INTRAMUSCULAR | Status: DC | PRN
  Administered 2015-06-04: 40 mL

## 2015-06-04 MED ORDER — THYROID 60 MG PO TABS
90.0000 mg | ORAL_TABLET | Freq: Every day | ORAL | Status: DC
  Administered 2015-06-05: 90 mg via ORAL
  Filled 2015-06-04: qty 1

## 2015-06-04 MED ORDER — PNEUMOCOCCAL VAC POLYVALENT 25 MCG/0.5ML IJ INJ
0.5000 mL | INJECTION | INTRAMUSCULAR | Status: AC
  Administered 2015-06-05: 0.5 mL via INTRAMUSCULAR
  Filled 2015-06-04: qty 0.5

## 2015-06-04 MED ORDER — LISINOPRIL 10 MG PO TABS
10.0000 mg | ORAL_TABLET | Freq: Every day | ORAL | Status: DC
  Administered 2015-06-04 – 2015-06-05 (×2): 10 mg via ORAL
  Filled 2015-06-04 (×2): qty 1

## 2015-06-04 MED ORDER — CARVEDILOL 25 MG PO TABS
25.0000 mg | ORAL_TABLET | Freq: Two times a day (BID) | ORAL | Status: DC
  Administered 2015-06-04 – 2015-06-05 (×2): 25 mg via ORAL
  Filled 2015-06-04 (×2): qty 1

## 2015-06-04 MED ORDER — CEFAZOLIN SODIUM-DEXTROSE 2-3 GM-% IV SOLR
INTRAVENOUS | Status: AC
  Filled 2015-06-04: qty 50

## 2015-06-04 MED ORDER — SODIUM CHLORIDE 0.9 % IV SOLN
INTRAVENOUS | Status: DC
  Administered 2015-06-04: 13:00:00 via INTRAVENOUS

## 2015-06-04 MED ORDER — LIDOCAINE HCL (PF) 1 % IJ SOLN
INTRAMUSCULAR | Status: AC
  Filled 2015-06-04: qty 30

## 2015-06-04 MED ORDER — SODIUM CHLORIDE 0.9 % IV SOLN: INTRAVENOUS | Status: AC

## 2015-06-04 MED ORDER — SODIUM CHLORIDE 0.9 % IJ SOLN: 3.0000 mL | INTRAMUSCULAR | Status: DC | PRN

## 2015-06-04 MED ORDER — MUPIROCIN 2 % EX OINT
TOPICAL_OINTMENT | Freq: Two times a day (BID) | CUTANEOUS | Status: DC
  Administered 2015-06-04: 1 via NASAL

## 2015-06-04 MED ORDER — SODIUM CHLORIDE 0.9 % IR SOLN
Status: AC
  Filled 2015-06-04: qty 2

## 2015-06-04 MED ORDER — MUPIROCIN 2 % EX OINT
TOPICAL_OINTMENT | CUTANEOUS | Status: AC
  Administered 2015-06-04: 1 via NASAL
  Filled 2015-06-04: qty 22

## 2015-06-04 MED ORDER — ONDANSETRON HCL 4 MG/2ML IJ SOLN: 4.0000 mg | Freq: Four times a day (QID) | INTRAMUSCULAR | Status: DC | PRN

## 2015-06-04 MED ORDER — CEFAZOLIN SODIUM 1-5 GM-% IV SOLN
1.0000 g | Freq: Four times a day (QID) | INTRAVENOUS | Status: AC
  Administered 2015-06-04 – 2015-06-05 (×3): 1 g via INTRAVENOUS
  Filled 2015-06-04 (×3): qty 50

## 2015-06-04 MED ORDER — MIDAZOLAM HCL 5 MG/5ML IJ SOLN
INTRAMUSCULAR | Status: DC | PRN
  Administered 2015-06-04: 2 mg via INTRAVENOUS
  Administered 2015-06-04: 1 mg via INTRAVENOUS

## 2015-06-04 MED ORDER — CEFAZOLIN SODIUM-DEXTROSE 2-3 GM-% IV SOLR
2.0000 g | INTRAVENOUS | Status: AC
  Administered 2015-06-04: 2 g via INTRAVENOUS

## 2015-06-04 MED ORDER — ACETAMINOPHEN 325 MG PO TABS
325.0000 mg | ORAL_TABLET | ORAL | Status: DC | PRN
  Administered 2015-06-05: 650 mg via ORAL
  Filled 2015-06-04: qty 2

## 2015-06-04 MED ORDER — LIDOCAINE HCL (PF) 1 % IJ SOLN
INTRAMUSCULAR | Status: DC | PRN
  Administered 2015-06-04: 15:00:00

## 2015-06-04 MED ORDER — CHLORHEXIDINE GLUCONATE 4 % EX LIQD: 60.0000 mL | Freq: Once | CUTANEOUS | Status: DC

## 2015-06-04 MED ORDER — SODIUM CHLORIDE 0.9 % IR SOLN
80.0000 mg | Status: AC
  Administered 2015-06-04: 80 mg

## 2015-06-04 MED ORDER — MIDAZOLAM HCL 5 MG/5ML IJ SOLN
INTRAMUSCULAR | Status: AC
  Filled 2015-06-04: qty 5

## 2015-06-04 MED ORDER — FENTANYL CITRATE (PF) 100 MCG/2ML IJ SOLN
INTRAMUSCULAR | Status: AC
  Filled 2015-06-04: qty 2

## 2015-06-04 SURGICAL SUPPLY — 6 items
CABLE SURGICAL S-101-97-12 (CABLE) ×3 IMPLANT
ICD EVERA XT VR DVBB1D4 (ICD Generator) ×3 IMPLANT
LEAD SPRINT QUAT SEC 6935M-62 (Lead) ×3 IMPLANT
PAD DEFIB LIFELINK (PAD) ×3 IMPLANT
SHEATH CLASSIC 9F (SHEATH) ×3 IMPLANT
TRAY PACEMAKER INSERTION (PACKS) ×3 IMPLANT

## 2015-06-04 NOTE — H&P (View-Only) (Signed)
Patient ID: Marc Potter, male   DOB: 05/03/1946, 69 y.o.   MRN: [id number]     Cardiology Office Note   Date:  05/08/2015   ID:  Marc Potter, DOB 03/24/1946, MRN [id number]  PCP:  SLATOSKY,JOHN J., MD  Cardiologist:  Jonathan Berry, M.D.; Christie Viscomi, MD   Chief Complaint  Patient presents with  . New Evaluation    discuss ICD implant per Dr Berry. patient reports no complaints. shortness of breath has resolved.      History of Present Illness: Marc Potter is a 69 y.o. male who presents for discussion of implantable defibrillator therapy. He has nonischemic cardiomyopathy (normal coronary arteries by angiography July 2016). Left ventricular ejection fraction in June was only 25-30 percent and after 3 months of medical therapy his echocardiogram in September did not show any improvement. He is on maximum usual carvedilol dose and is on a relatively low dose of ACE inhibitor, possibly due to low blood pressure. He has permanent atrial fibrillation that has been well rate controlled and is on chronic anticoagulation with a direct oral anticoagulant he is also taking aspirin, but does not have known vascular disease.  He felt extremely poorly 1 heart failure was initially diagnosed, but now has NYHA functional class II. He does not have any edema and is not taking any diuretics.  Past Medical History  Diagnosis Date  . Heart palpitations   . Hypertension   . Hyperlipidemia   . Atrial fibrillation (HCC)   . Left ventricular dysfunction   . Normal coronary arteries     by outpatient diagnostic coronary arteriography performed by myself 12/24/14.    Past Surgical History  Procedure Laterality Date  . Nm myoview ltd  2012  . Doppler echocardiography  2012  . Cardiac catheterization N/A 12/24/2014    Procedure: Left Heart Cath and Coronary Angiography;  Surgeon: Jonathan J Berry, MD;  Location: [facility] INVASIVE CV LAB;  Service: Cardiovascular;  Laterality: N/A;     Current  Outpatient Prescriptions  Medication Sig Dispense Refill  . aspirin 81 MG tablet Take 81 mg by mouth daily.    . carvedilol (COREG) 25 MG tablet Take 1 tablet (25 mg total) by mouth 2 (two) times daily. 180 tablet 3  . Coenzyme Q10 (CO Q-10) 100 MG CAPS Take 1 tablet by mouth daily.    . edoxaban (SAVAYSA) 60 MG TABS tablet Take 60 mg by mouth daily. 35 tablet 0  . lisinopril (PRINIVIL,ZESTRIL) 10 MG tablet Take 1 tablet (10 mg total) by mouth daily. 90 tablet 3  . Multiple Vitamin (MULTIVITAMIN) tablet Take 1 tablet by mouth daily.    . multivitamin-lutein (OCUVITE-LUTEIN) CAPS capsule Take 1 capsule by mouth daily.    . thyroid (ARMOUR) 90 MG tablet Take 90 mg by mouth daily.     No current facility-administered medications for this visit.    Allergies:   Review of patient's allergies indicates no known allergies.    Social History:  The patient  reports that he has quit smoking. He does not have any smokeless tobacco history on file. He reports that he drinks alcohol. He reports that he does not use illicit drugs.   Family History:  The patient's family history includes Cancer in his father; Diabetes in his mother; Heart failure in his father; Hypertension in his mother.    ROS:  Please see the history of present illness.    Otherwise, review of systems positive for none.   All other systems are reviewed and   negative.    PHYSICAL EXAM: VS:  Pulse 87  Ht 6' (1.829 m)  Wt 180 lb 11.2 oz (81.965 kg)  BMI 24.50 kg/m2 , BMI Body mass index is 24.5 kg/(m^2).  General: Alert, oriented x3, no distress Head: no evidence of trauma, PERRL, EOMI, no exophtalmos or lid lag, no myxedema, no xanthelasma; normal ears, nose and oropharynx Neck: normal jugular venous pulsations and no hepatojugular reflux; brisk carotid pulses without delay and no carotid bruits Chest: clear to auscultation, no signs of consolidation by percussion or palpation, normal fremitus, symmetrical and full respiratory  excursions Cardiovascular: normal position and quality of the apical impulse, irregular rhythm, normal first and second heart sounds, no murmurs, rubs or gallops Abdomen: no tenderness or distention, no masses by palpation, no abnormal pulsatility or arterial bruits, normal bowel sounds, no hepatosplenomegaly Extremities: no clubbing, cyanosis or edema; 2+ radial, ulnar and brachial pulses bilaterally; 2+ right femoral, posterior tibial and dorsalis pedis pulses; 2+ left femoral, posterior tibial and dorsalis pedis pulses; no subclavian or femoral bruits Neurological: grossly nonfocal Psych: euthymic mood, full affect   EKG:  EKG is ordered today. The ekg ordered today demonstrates atrial fibrillation, minor intraventricular conduction delay with QRS duration 114 ms, inferolateral ST depression and T-wave inversion, unchanged from previous tracings, QTC 445 ms   Recent Labs: 12/18/2014: ALT 25; Hemoglobin 15.3; Platelets 192; TSH 0.943 03/12/2015: BUN 16; Creat 0.77; Potassium 4.4; Sodium 140    Lipid Panel    Component Value Date/Time   CHOL 164 12/18/2014 0959   TRIG 209* 12/18/2014 0959   HDL 43 12/18/2014 0959   CHOLHDL 3.8 12/18/2014 0959   VLDL 42* 12/18/2014 0959   LDLCALC 79 12/18/2014 0959      Wt Readings from Last 3 Encounters:  05/07/15 180 lb 11.2 oz (81.965 kg)  04/17/15 180 lb (81.647 kg)  03/21/15 179 lb 12.8 oz (81.557 kg)     ASSESSMENT AND PLAN:  Mr. Reiland meets [place] for Primary prevention ICD implantation for non ischemic cardiomyopathy (left ventricular ejection fraction under 35%, heart failure NYHA class II-III, on comprehensive medical therapy > 3 months).  Discussed the purpose of defibrillators, pros and cons of the device, and details of the implantation procedure and possible immediate complications (infection, pneumothorax, perforation, bleeding, the dislodgment, reoperation, etc.) as well as possible long-term issues of unnecessary shocks,  softer/hardware malfunction, need for reoperation. Also reviewed activity restrictions, expected generator longevity and follow-up. Discussed the fact that this is a device that may prolong his life, but will have no impact on his symptoms will not improve his quality of life. Also reviewed the fact that only roughly one out of every 3 implanted defibrillators for his indication will ever deliver therapy.  The patient and his wife asked numerous questions and we had at least a 30 minute discussion.  I think there is room to try to improve ACE inhibitor therapy and will increase the dose of lisinopril to 10 mg today. I am doubtful that this will lead to any further improvement in LV function.  While he does have a minor intraventricular conduction delay, he is unlikely to benefit from cardiac resynchronization therapy.  He has chronic atrial fibrillation but a relatively low embolic risk (CHADSVasc score is 2). Briefly hold his anticoagulant for the surgical procedure.  Current medicines are reviewed at length with the patient today.  The patient does not have concerns regarding medicines.  The following changes have been made:  Increase lisinopril to 10 mg  daily  Labs/ tests ordered today include:  Orders Placed This Encounter  Procedures  . APTT  . Protime-INR  . CBC  . Comprehensive metabolic panel  . EKG 12-Lead  . IMPLANTABLE CARDIOVERTER DEFIBRILLATOR IMPLANT     Patient Instructions  Dr Royann Shivers has recommended making the following medication changes: INCREASE Lisinopril 10 mg - you may take 2 tablets (10 mg total) of the 5 mg until that bottle runs out.   >>A new prescription, with updated instructions, has been sent to your pharmacy electronically.  Your physician has recommended that you have a defibrillator inserted on DECEMBER 20th, 2016. An implantable cardioverter defibrillator (ICD) is a small device that is placed in your chest or, in rare cases, your abdomen. This  device uses electrical pulses or shocks to help control life-threatening, irregular heartbeats that could lead the heart to suddenly stop beating (sudden cardiac arrest). Leads are attached to the ICD that goes into your heart. This is done in the hospital and usually requires an overnight stay. Please see the instruction sheet given to you today for more information.  >>You will have to have blood work done prior to the procedure.  >>DO NOT TAKE YOUR SAVAYSA ON DECEMBER 19th, 2016.     Joie Bimler, MD  05/08/2015 11:46 AM    Thurmon Fair, MD, [facility] [phone] office [phone] pager

## 2015-06-04 NOTE — Interval H&P Note (Signed)
History and Physical Interval Note:  06/04/2015 1:29 PM  Marc Potter  has presented today for surgery, with the diagnosis of left ventricular disfunction  The various methods of treatment have been discussed with the patient and family. After consideration of risks, benefits and other options for treatment, the patient has consented to  Procedure(s): ICD Implant (N/A) as a surgical intervention .  The patient's history has been reviewed, patient examined, no change in status, stable for surgery.  I have reviewed the patient's chart and labs.  Questions were answered to the patient's satisfaction.     Marc Potter

## 2015-06-04 NOTE — Op Note (Signed)
Procedure report  Procedure performed:  1. Implantation of new single chamber cardioverter defibrillator 2. Fluoroscopy 3. Moderate sedation   Reason for procedure:  Primary prevention of sudden cardiac death Nonischemic cardiomyopathy,  left ventricular ejection fraction less than 35%, Heart failure NYHA class 2, on comprehensive medical therapy for over 90 days (SCD-HeFT)   Procedure performed by:  Thurmon Fair, MD  Complications:  None  Estimated blood loss:  <10 mL  Medications administered during procedure:  Ancef 2 g intravenously Vancomycin 1 g intravenously Lidocaine 1% 30 mL locally,  Fentanyl 50 mcg intravenously Versed 3 mg intravenously  Device details:  Generator Medtronic Evera XT VR model DVBB1D4 serial number [id number] Right ventricular lead Medtronic [id number] serial number [id number]  Procedure details:  After the risks and benefits of the procedure were discussed the patient provided informed consent and was brought to the cardiac cath lab in the fasting state. The patient was prepped and draped in usual sterile fashion. Local anesthesia with 1% lidocaine was administered to to the left infraclavicular area. A 5-6 cm horizontal incision was made parallel with and 2-3 cm caudal to the left clavicle. Using electrocautery and blunt dissection a prepectoral pocket was created down to the level of the pectoralis major muscle fascia. The pocket was carefully inspected for hemostasis. An antibiotic-soaked sponge was placed in the pocket.  Under fluoroscopic guidance and using the modified Seldinger technique a singlee venipuncture was performed to access the left subclavian vein. No difficulty was encountered accessing the vein. The J-tip guidewire was subsequently exchanged for a 9 French safe sheath.  Under fluoroscopic guidance the ventricular lead was advanced to level of the mid to apical right ventricular septum and the active-fixation helix was  deployed. Prominent current of injury was seen. Satisfactory pacing and sensing parameters were recorded. There was no evidence of diaphragmatic stimulation at maximum device output. The safe sheath was peeled away and the lead was secured in place with 2-0 silk.  The antibiotic-soaked sponge was removed from the pocket. The pocket was flushed with copious amounts of antibiotic solution. Reinspection showed excellent hemostasis.  The ventricular lead was connected to the generator and appropriate ventricular pacing was seen. Repeat testing of the lead parameters via telemetry showed excellent values.  The entire system was then carefully inserted in the pocket with care been taking that the lead and device assumed a comfortable position without pressure on the incision. Great care was taken so that the lead was located deep to the generator. The pocket was then closed in layers using 2 layers of 2-0 Vicryl and cutaneous staples, after which a sterile dressing was applied.  At the end of the procedure the following lead parameters were encountered:  Right ventricular lead sensed R waves 7.9 mV, impedance 627 ohms, threshold 1.0 V at 0.4 ms pulse width. High voltage impedance 69.  Thurmon Fair, MD, [facility] [phone] office [phone] pager

## 2015-06-04 NOTE — Discharge Instructions (Signed)

## 2015-06-04 NOTE — Progress Notes (Signed)
ICD Criteria  Current LVEF:30%. Within 12 months prior to implant: Yes   Heart failure history: Yes, Class II  Cardiomyopathy history: Yes, Non-Ischemic Cardiomyopathy.  Atrial Fibrillation/Atrial Flutter: Yes, Permanent.  Ventricular tachycardia history: No.  Cardiac arrest history: No.  History of syndromes with risk of sudden death: No.  Previous ICD: No.  Current ICD indication: Primary  PPM indication: No.   Class I or II Bradycardia indication present: No  Beta Blocker therapy for 3 or more months: Yes, prescribed.   Ace Inhibitor/ARB therapy for 3 or more months: Yes, prescribed.

## 2015-06-05 ENCOUNTER — Ambulatory Visit (HOSPITAL_COMMUNITY): Payer: Medicare Other

## 2015-06-05 ENCOUNTER — Encounter (HOSPITAL_COMMUNITY): Payer: Self-pay | Admitting: Cardiovascular Disease

## 2015-06-05 DIAGNOSIS — Z7901 Long term (current) use of anticoagulants: Secondary | ICD-10-CM | POA: Diagnosis not present

## 2015-06-05 DIAGNOSIS — Z7982 Long term (current) use of aspirin: Secondary | ICD-10-CM | POA: Diagnosis not present

## 2015-06-05 DIAGNOSIS — I429 Cardiomyopathy, unspecified: Secondary | ICD-10-CM | POA: Diagnosis not present

## 2015-06-05 DIAGNOSIS — E785 Hyperlipidemia, unspecified: Secondary | ICD-10-CM | POA: Diagnosis not present

## 2015-06-05 DIAGNOSIS — Z4502 Encounter for adjustment and management of automatic implantable cardiac defibrillator: Secondary | ICD-10-CM | POA: Diagnosis not present

## 2015-06-05 DIAGNOSIS — I480 Paroxysmal atrial fibrillation: Secondary | ICD-10-CM | POA: Diagnosis not present

## 2015-06-05 MED ORDER — RIVAROXABAN 20 MG PO TABS
20.0000 mg | ORAL_TABLET | Freq: Every day | ORAL | Status: DC
Start: 2015-06-06 — End: 2015-09-18

## 2015-06-05 MED ORDER — RIVAROXABAN 20 MG PO TABS: 20.0000 mg | ORAL_TABLET | Freq: Every day | ORAL | Status: DC

## 2015-06-05 MED FILL — Sodium Chloride Irrigation Soln 0.9%: Qty: 500 | Status: AC

## 2015-06-05 MED FILL — Gentamicin Sulfate Inj 40 MG/ML: INTRAMUSCULAR | Qty: 2 | Status: AC

## 2015-06-05 NOTE — Plan of Care (Signed)
Problem: Skin Integrity: Goal: Risk for impaired skin integrity will decrease Outcome: Completed/Met Date Met:  06/05/15 Dressing clean and dry, with small shadowing marked from previous shift with no change.     

## 2015-06-05 NOTE — Plan of Care (Signed)
Problem: Pain Managment: Goal: General experience of comfort will improve Outcome: Adequate for Discharge Patient pain level at surgical site a 3/10 this am. Treated with 650 mg Tylenol, brought pain level down to 2/10.

## 2015-06-05 NOTE — Care Management Note (Signed)
Case Management Note  Patient Details  Name: Marc Potter MRN: [id number] Date of Birth: 1946-03-17  Subjective/Objective:     Pt admitted for ICD placement. Plan for d/c home 06-05-15 on Xarelto.             Action/Plan: CM did provide pt with the Xarelto 30 day free card. No further needs from CM @ this time.   Gala Lewandowsky, RN 06/05/2015, 11:10 AM

## 2015-06-05 NOTE — Progress Notes (Signed)
Patient Name: Marc Potter Date of Encounter: 06/05/2015  Principal Problem:   At risk for sudden cardiac death Active Problems:   Atrial fibrillation (HCC)   Left ventricular dysfunction   Non-ischemic cardiomyopathy (HCC)   ICD (implantable cardioverter-defibrillator) in place   Length of Stay:   SUBJECTIVE  No pain at site. Minimal oozing has stopped. CXR shows normal post ICD findings. Good lead parameters on ICD check.  CURRENT MEDS . carvedilol  25 mg Oral BID  . lisinopril  10 mg Oral Daily  . mupirocin ointment   Nasal BID  . pneumococcal 23 valent vaccine  0.5 mL Intramuscular Tomorrow-1000  . thyroid  90 mg Oral Daily    OBJECTIVE   Intake/Output Summary (Last 24 hours) at 06/05/15 0841 Last data filed at 06/05/15 0835  Gross per 24 hour  Intake   1280 ml  Output      0 ml  Net   1280 ml   Filed Weights   06/04/15 1111  Weight: 175 lb (79.379 kg)    PHYSICAL EXAM Filed Vitals:   06/04/15 1507 06/04/15 1512 06/04/15 1537 06/04/15 1900  BP: 137/79  135/72   Pulse: 0 0 98   Temp:   97.8 F (36.6 C) 98.1 F (36.7 C)  TempSrc:   Oral Oral  Resp: Height:      Weight:      SpO2: 99% 0% 100%    General: Alert, oriented x3, no distress Head: no evidence of trauma, PERRL, EOMI, no exophtalmos or lid lag, no myxedema, no xanthelasma; normal ears, nose and oropharynx Neck: normal jugular venous pulsations and no hepatojugular reflux; brisk carotid pulses without delay and no carotid bruits Chest: clear to auscultation, no signs of consolidation by percussion or palpation, normal fremitus, symmetrical and full respiratory excursions. Healthy left subclavian surgical dressing, no hematoma, no bleeding. Cardiovascular: normal position and quality of the apical impulse, regular rhythm, normal first and second heart sounds, no rubs or gallops, no murmur Abdomen: no tenderness or distention, no masses by palpation, no abnormal pulsatility or  arterial bruits, normal bowel sounds, no hepatosplenomegaly Extremities: no clubbing, cyanosis or edema; 2+ radial, ulnar and brachial pulses bilaterally; 2+ right femoral, posterior tibial and dorsalis pedis pulses; 2+ left femoral, posterior tibial and dorsalis pedis pulses; no subclavian or femoral bruits Neurological: grossly nonfocal    Radiology Studies Imaging results have been reviewed and Dg Chest 2 View  06/05/2015  CLINICAL DATA:  Status post AICD insertion on June 04, 2015 EXAM: CHEST  2 VIEW COMPARISON:  PA and lateral chest x-ray of December 20, 2014. FINDINGS: The lungs are well-expanded and clear. There is no pleural effusion or pneumothorax. Nipple shadows are visible bilaterally The heart and pulmonary vascularity are normal. The bony thorax exhibits no acute abnormality. The implantable AICD is in reasonable position radiographically. IMPRESSION: There is no postprocedure complication following AICD placement. There is no active cardiopulmonary disease. Electronically Signed   By: David  [place] M.D.   On: 06/05/2015 07:43    TELE atrial fibrillation  ECG atrial fibrillation, incomplete LBBB, chronic St-T chamges, borderline QT prolongation  Device check R waves11.5 mV, impedance 456 ohm, threshold 0.5V@0 .4 ms, 0% V pacing  ASSESSMENT AND PLAN  DC home. Wound check 7-10 days. Office visit 2 months. Wound care instructions and activity restrictions discussed, as well as "shock plan".  Marc Potter is too expensive with his insurance and his renal function is a little too good for it  anyway. Xarelto is preferred on his insurance plan. Start Xarelto 20 mg with dinner tomorrow evening.Marc Fair, MD, [facility] [phone] office [phone] pager 06/05/2015 8:41 AM

## 2015-06-05 NOTE — Discharge Summary (Signed)
CARDIOLOGY DISCHARGE SUMMARY   Patient ID: Marc Potter MRN: [id number] DOB/AGE: Jul 10, 1945 69 y.o.  Admit date: 06/04/2015 Discharge date: 06/05/2015  PCP: Nonnie Done., MD Primary Cardiologist: Dr. Allyson Sabal  Primary Discharge Diagnosis: At risk for sudden cardiac death Secondary Discharge Diagnosis:    Atrial fibrillation (HCC)   Left ventricular dysfunction   Non-ischemic cardiomyopathy (HCC)   ICD (implantable cardioverter-defibrillator) in place   Consults: None  Procedures: ICD Implantation  Hospital Course: Marc Potter is a 69 y.o. male with past medical history of HTN, HLD, PAF (on anticoagulation), and non-ischemic cardiomyopathy (cath in 12/2014 showing normal cors, EF was 30%) who presented to [facility] on 06/04/2015 for implantation of an ICD.   He had a repeat echo in 02/2015 which still showed a diminished EF of 25-30% despite optimal medical therapy. He was referred to Dr. Royann Shivers to discuss possible ICD implantation. The risks and benefits of the procedure were discussed in detail with the patient and he agreed to proceed.   The procedure was carried out on 06/04/2015. A Generator Medtronic Evera XT VR (model DVBB1D4 serial number [id number]) and Right ventricular lead Medtronic [id number] serial number [id number]) were used during the procedure. He tolerated the procedure well and no complications were noted.  On 06/05/2015, he had no pain at the surgical site. His CXR showed normal post-ICD findings.   Prior to discharge, he reported Marc Potter was too expensive with his current insurance coverage. He was switched to Xarelto  daily for anticoagulation in regards to his atrial fibrillation and instructed to start the medication on 06/06/2015.   The patient was last examined by Dr. Royann Shivers and deemed stable for discharge. He has a wound check scheduled on 06/20/2015 and will follow-up with Dr. Royann Shivers on 08/07/2015.  Labs:   Lab Results    Component Value Date   WBC 4.6 05/28/2015   HGB 14.3 05/28/2015   HCT 41.6 05/28/2015   MCV 93.1 05/28/2015   PLT 175 05/28/2015    Radiology:  Dg Chest 2 View: 06/05/2015  CLINICAL DATA:  Status post AICD insertion on June 04, 2015 EXAM: CHEST  2 VIEW COMPARISON:  PA and lateral chest x-ray of December 20, 2014. FINDINGS: The lungs are well-expanded and clear. There is no pleural effusion or pneumothorax. Nipple shadows are visible bilaterally The heart and pulmonary vascularity are normal. The bony thorax exhibits no acute abnormality. The implantable AICD is in reasonable position radiographically. IMPRESSION: There is no postprocedure complication following AICD placement. There is no active cardiopulmonary disease. Electronically Signed   By: David  [place] M.D.   On: 06/05/2015 07:43    ICD Implant: 06/04/2015 1. Implantation of new single chamber cardioverter defibrillator 2. Fluoroscopy 3. Moderate sedation  Reason for procedure: Primary prevention of sudden cardiac death Nonischemic cardiomyopathy, left ventricular ejection fraction less than 35%, Heart failure NYHA class 2, on comprehensive medical therapy for over 90 days (SCD-HeFT)  Procedure performed by: Thurmon Fair, MD  Complications: None  Estimated blood loss: <10 mL  Medications administered during procedure: Ancef 2 g intravenously Vancomycin 1 g intravenously Lidocaine 1% 30 mL locally,  Fentanyl 50 mcg intravenously Versed 3 mg intravenously  Device details: Generator Medtronic Evera XT VR model DVBB1D4 serial number [id number] Right ventricular lead Medtronic [id number] serial number [id number]  Procedure details: After the risks and benefits of the procedure were discussed the patient provided informed consent and was brought to the cardiac cath lab in the fasting state. The patient was prepped and  draped in usual sterile fashion. Local anesthesia with 1% lidocaine was administered to to the left  infraclavicular area. A 5-6 cm horizontal incision was made parallel with and 2-3 cm caudal to the left clavicle. Using electrocautery and blunt dissection a prepectoral pocket was created down to the level of the pectoralis major muscle fascia. The pocket was carefully inspected for hemostasis. An antibiotic-soaked sponge was placed in the pocket.  Under fluoroscopic guidance and using the modified Seldinger technique a singlee venipuncture was performed to access the left subclavian vein. No difficulty was encountered accessing the vein. The J-tip guidewire was subsequently exchanged for a 9 French safe sheath.  Under fluoroscopic guidance the ventricular lead was advanced to level of the mid to apical right ventricular septum and the active-fixation helix was deployed. Prominent current of injury was seen. Satisfactory pacing and sensing parameters were recorded. There was no evidence of diaphragmatic stimulation at maximum device output. The safe sheath was peeled away and the lead was secured in place with 2-0 silk.  The antibiotic-soaked sponge was removed from the pocket. The pocket was flushed with copious amounts of antibiotic solution. Reinspection showed excellent hemostasis.  The ventricular lead was connected to the generator and appropriate ventricular pacing was seen. Repeat testing of the lead parameters via telemetry showed excellent values.  The entire system was then carefully inserted in the pocket with care been taking that the lead and device assumed a comfortable position without pressure on the incision. Great care was taken so that the lead was located deep to the generator. The pocket was then closed in layers using 2 layers of 2-0 Vicryl and cutaneous staples, after which a sterile dressing was applied.  At the end of the procedure the following lead parameters were encountered:  Right ventricular lead sensed R waves 7.9 mV, impedance 627 ohms, threshold 1.0 V at 0.4 ms pulse  width. High voltage impedance 69.  Thurmon Fair, MD, [facility]   EKG: Atrial Fibrillation, rate controlled in the 80's.   FOLLOW UP PLANS AND APPOINTMENTS No Known Allergies   Medication List    STOP taking these medications        edoxaban 60 MG Tabs tablet  Commonly known as:  SAVAYSA      TAKE these medications        carvedilol 25 MG tablet  Commonly known as:  COREG  Take 1 tablet (25 mg total) by mouth 2 (two) times daily.     Co Q-10 100 MG Caps  Take 1 tablet by mouth daily.     lisinopril 10 MG tablet  Commonly known as:  PRINIVIL,ZESTRIL  Take 1 tablet (10 mg total) by mouth daily.     multivitamin tablet  Take 1 tablet by mouth daily.     multivitamin-lutein Caps capsule  Take 1 capsule by mouth daily.     rivaroxaban 20 MG Tabs tablet  Commonly known as:  XARELTO  Take 1 tablet (20 mg total) by mouth daily with supper.  Start taking on:  06/06/2015     thyroid 90 MG tablet  Commonly known as:  ARMOUR  Take 90 mg by mouth daily.         Follow-up Information    Follow up with [facility] On 06/20/2015.   Specialty:  Cardiology   Why:  Wound Check at 2:30PM [facility] [employer] from [employer])   [employer] information:   [address], [address] [employer]  [place] [address] [phone]      Follow up with Thurmon Fair, MD On 08/07/2015.   Specialty:  Cardiology   Why:  Cardiology Follow-Up with Dr. Royann Shivers on 08/07/2015 at 3:15PM [facility] Location)   Contact information:   [address] [place] [address] [phone]       BRING ALL MEDICATIONS WITH YOU TO FOLLOW UP APPOINTMENTS  Time spent with patient to include physician time: 40 minutes  Signed: Ellsworth Lennox, PA 06/05/2015, 1:20 PM Co-Sign MD

## 2015-06-20 ENCOUNTER — Ambulatory Visit (INDEPENDENT_AMBULATORY_CARE_PROVIDER_SITE_OTHER): Payer: Medicare Other | Admitting: *Deleted

## 2015-06-20 ENCOUNTER — Encounter: Payer: Self-pay | Admitting: Cardiovascular Disease

## 2015-06-20 DIAGNOSIS — I519 Heart disease, unspecified: Secondary | ICD-10-CM | POA: Diagnosis not present

## 2015-06-20 DIAGNOSIS — I429 Cardiomyopathy, unspecified: Secondary | ICD-10-CM | POA: Diagnosis not present

## 2015-06-20 DIAGNOSIS — I428 Other cardiomyopathies: Secondary | ICD-10-CM

## 2015-06-20 LAB — CUP PACEART INCLINIC DEVICE CHECK
Battery Remaining Longevity: 133 mo
Battery Voltage: 3.02 V
Date Time Interrogation Session: 20170105150721
HIGH POWER IMPEDANCE MEASURED VALUE: 73 Ohm
Lead Channel Impedance Value: 380 Ohm
Lead Channel Impedance Value: 456 Ohm
Lead Channel Setting Pacing Pulse Width: 0.4 ms
MDC IDC LEAD IMPLANT DT: 20161220
MDC IDC LEAD LOCATION: 753860
MDC IDC MSMT LEADCHNL RV PACING THRESHOLD AMPLITUDE: 0.75 V
MDC IDC MSMT LEADCHNL RV PACING THRESHOLD PULSEWIDTH: 0.4 ms
MDC IDC MSMT LEADCHNL RV SENSING INTR AMPL: 14.75 mV
MDC IDC SET LEADCHNL RV PACING AMPLITUDE: 3.5 V
MDC IDC SET LEADCHNL RV SENSING SENSITIVITY: 0.3 mV
MDC IDC STAT BRADY RV PERCENT PACED: 0.09 %

## 2015-06-20 NOTE — Progress Notes (Signed)
Wound check appointment. Staples removed. Wound without redness or edema. Incision edges approximated, wound well healed. Normal device function. Thresholds, sensing, and impedances consistent with implant measurements. Device programmed at 3.5V for extra safety margin until 3 month visit. Histogram distribution appropriate for patient and level of activity. No mode switches or ventricular arrhythmias noted. Patient educated about wound care, arm mobility, lifting restrictions, shock plan. ROV with [facility] on 08/07/15.

## 2015-08-07 ENCOUNTER — Ambulatory Visit (INDEPENDENT_AMBULATORY_CARE_PROVIDER_SITE_OTHER): Payer: Medicare Other | Admitting: Cardiovascular Disease

## 2015-08-07 ENCOUNTER — Encounter: Payer: Self-pay | Admitting: Cardiovascular Disease

## 2015-08-07 VITALS — BP 132/77 | HR 68 | Ht 72.0 in | Wt 186.0 lb

## 2015-08-07 DIAGNOSIS — I429 Cardiomyopathy, unspecified: Secondary | ICD-10-CM | POA: Diagnosis not present

## 2015-08-07 DIAGNOSIS — I10 Essential (primary) hypertension: Secondary | ICD-10-CM

## 2015-08-07 DIAGNOSIS — I482 Chronic atrial fibrillation, unspecified: Secondary | ICD-10-CM

## 2015-08-07 DIAGNOSIS — I428 Other cardiomyopathies: Secondary | ICD-10-CM

## 2015-08-07 DIAGNOSIS — Z95810 Presence of automatic (implantable) cardiac defibrillator: Secondary | ICD-10-CM

## 2015-08-07 NOTE — Patient Instructions (Signed)
Remote monitoring is used to monitor your ICD from home. This monitoring reduces the number of office visits required to check your device to one time per year. It allows [place] to monitor the functioning of your device to ensure it is working properly. You are scheduled for a device check from home on MAY 23,2017. You may send your transmission at any time that day. If you have a wireless device, the transmission will be sent automatically. After your physician reviews your transmission, you will receive a postcard with your next transmission date.  Dr. Royann Shivers recommends that you schedule a follow-up appointment in: ONE YEAR WITH A DEFIBRILLATOR CHECK (MEDTRONIC-BLUE)

## 2015-08-09 ENCOUNTER — Encounter: Payer: Self-pay | Admitting: Cardiovascular Disease

## 2015-08-09 NOTE — Progress Notes (Signed)
Patient ID: Marc Potter, male   DOB: December 07, 1945, 70 y.o.   MRN: [id number]    Cardiology Office Note    Date:  08/09/2015   ID:  Marc Potter, DOB [birth year], MRN [id number]  PCP:  Nonnie Done., MD  Cardiologist: [name], M.D.;  Thurmon Fair, MD   Chief Complaint  Patient presents with  . Follow-up  . Dizziness    History of Present Illness:  Marc Potter is a 70 y.o. male returning in follow-up after implantation of a defibrillator in December 2016 for primary prevention in the setting of nonischemic cardiomyopathy with severely depressed left ventricular systolic function and chronic atrial fibrillation. The device site has healed very well and does not bother him. His single-chamber Medtronic evera device is functioning normally or it generator longevity is estimated at 11 years. R waves are 12.3 mV, impedance 399 ohms, threshold 0.75 V is 0.4 ms pulse width, he only has 0.2% ventricular pacing. The device logs activity at around 3.5 hours per day on the average.  His blood pressure was slightly elevated on arrival at 158/70, but when rechecked was 132/77.  He denies defibrillator shocks, discomfort defibrillator site, syncope, palpitations, chest pain, exertional dyspnea, leg edema, focal neurological events, claudication, bleeding.  Past Medical History  Diagnosis Date  . Heart palpitations   . Hypertension   . Hyperlipidemia   . Atrial fibrillation (HCC)   . Left ventricular dysfunction   . Normal coronary arteries     by outpatient diagnostic coronary arteriography performed by myself 12/24/14.  [name] Heart failure with reduced ejection fraction, NYHA class II (HCC)     a. EF 25-30% by echo in 02/2015  . AICD (automatic cardioverter/defibrillator) present 06/04/2015    Past Surgical History  Procedure Laterality Date  . Nm myoview ltd  2012  . Doppler echocardiography  2012  . Cardiac catheterization N/A 12/24/2014    Procedure: Left Heart Cath and  Coronary Angiography;  Surgeon: Runell Gess, MD;  [facility] INVAS[facility] CV LAB;  Service: Cardiovascular;  Laterality: N/A;  . Ep implantable device  06/04/2015  . Ep implantable device N/A 06/04/2015    Procedure: ICD Implant;  Surgeon: Thurmon Fair, MD;  Location: [facility] INVASIVE CV LAB;  Service: Cardiovascular;  Laterality: N/A;    Outpatient Prescriptions Prior to Visit  Medication Sig Dispense Refill  . carvedilol (COREG) 25 MG tablet Take 1 tablet (25 mg total) by mouth 2 (two) times daily. 180 tablet 3  . Coenzyme Q10 (CO Q-10) 100 MG CAPS Take 1 tablet by mouth daily.    [name] glucosamine-chondroitin 500-400 MG tablet Take 1 tablet by mouth 2 (two) times daily.    [name] lisinopril (PRINIVIL,ZESTRIL) 10 MG tablet Take 1 tablet (10 mg total) by mouth daily. 90 tablet 3  . Multiple Vitamin (MULTIVITAMIN) tablet Take 1 tablet by mouth daily.    . multivitamin-lutein (OCUVITE-LUTEIN) CAPS capsule Take 1 capsule by mouth daily.    . rivaroxaban (XARELTO) 20 MG TABS tablet Take 1 tablet (20 mg total) by mouth daily with supper. 30 tablet 10  . thyroid (ARMOUR) 90 MG tablet Take 90 mg by mouth daily.     No facility-administered medications prior to visit.     Allergies:   Review of patient's allergies indicates no known allergies.   Social History   Social History  . Marital Status: Married    Spouse Name: N/A  . Number of Children: N/A  . Years of Education: N/A   Social History Main Topics  .  Smoking status: Former Smoker    Quit date: 06/15/1993  . Smokeless tobacco: Never Used  . Alcohol Use: Yes     Comment: DRINKS ON OCCASION   . Drug Use: No  . Sexual Activity: Not Asked   Other Topics Concern  . None   Social History Narrative     Family History:  The patient's family history includes Cancer in his father; Diabetes in his mother; Heart failure in his father; Hypertension in his mother.   ROS:   Please see the history of present illness.    ROS All other systems  reviewed and are negative.   PHYSICAL EXAM:   VS:  BP 158/70 mmHg  Pulse 68  Ht 6' (1.829 m)  Wt 84.369 kg (186 lb)  BMI 25.22 kg/m2   GEN: Well nourished, well developed, in no acute distress HEENT: normal Neck: no JVD, carotid bruits, or masses Cardiac: Irregular, no murmurs, rubs, or gallops,no edema , well-healed left subclavian defibrillator site Respiratory:  clear to auscultation bilaterally, normal work of breathing GI: soft, nontender, nondistended, + BS MS: no deformity or atrophy Skin: warm and dry, no rash Neuro:  Alert and Oriented x 3, Strength and sensation are intact Psych: euthymic mood, full affect  Wt Readings from Last 3 Encounters:  08/07/15 84.369 kg (186 lb)  06/04/15 79.379 kg (175 lb)  05/07/15 81.965 kg (180 lb 11.2 oz)      Studies/Labs Reviewed:   EKG:  EKG is not ordered today.   Recent Labs: 12/18/2014: TSH 0.943 05/28/2015: ALT 15; BUN 16; Creat 0.83; Hemoglobin 14.3; Platelets 175; Potassium 4.5; Sodium 141   Lipid Panel    Component Value Date/Time   CHOL 164 12/18/2014 0959   TRIG 209* 12/18/2014 0959   HDL 43 12/18/2014 0959   CHOLHDL 3.8 12/18/2014 0959   VLDL 42* 12/18/2014 0959   LDLCALC 79 12/18/2014 0959     ASSESSMENT:    1. ICD (implantable cardioverter-defibrillator) in place   2. Non-ischemic cardiomyopathy (HCC)   3. Chronic atrial fibrillation (HCC)   4. Essential hypertension      PLAN:  In order of problems listed above:  1. Normal defibrillator function. Discussed follow-up via [facility] remotely every 3 months and yearly office visits. Reviewed his "shock plan". 2. Well compensated heart failure, functional class II, clinically euvolemic without the need for diuretics. On appropriate doses of beta blocker. There may be some room to increase his ACE inhibitor dose. 3. Atrial fibrillation rate control is good. He is on appropriate anticoagulation.CHADSVasc 3 (age, HTN, HF). 4. Blood pressure is in the desirable  range but may allow increase in his ACE inhibitor dose    Medication Adjustments/Labs and Tests Ordered: Current medicines are reviewed at length with the patient today.  Concerns regarding medicines are outlined above.  Medication changes, Labs and Tests ordered today are listed in the Patient Instructions below. Patient Instructions  Remote monitoring is used to monitor your ICD from home. This monitoring reduces the number of office visits required to check your device to one time per year. It allows [place] to monitor the functioning of your device to ensure it is working properly. You are scheduled for a device check from home on MAY 23,2017. You may send your transmission at any time that day. If you have a wireless device, the transmission will be sent automatically. After your physician reviews your transmission, you will receive a postcard with your next transmission date.  Dr. Royann Shivers recommends that you  schedule a follow-up appointment in: ONE YEAR WITH A DEFIBRILLATOR CHECK (MEDTRONIC-BLUE)         Joie Bimler, MD  08/09/2015 4:27 PM    [facility] [address] [place], [place], [place]  [address] Phone: [phone]; Fax: [phone]

## 2015-09-18 ENCOUNTER — Other Ambulatory Visit: Payer: Self-pay | Admitting: *Deleted

## 2015-09-18 MED ORDER — RIVAROXABAN 20 MG PO TABS: 20.0000 mg | ORAL_TABLET | Freq: Every day | ORAL | Status: DC

## 2015-09-20 DIAGNOSIS — Z8042 Family history of malignant neoplasm of prostate: Secondary | ICD-10-CM | POA: Diagnosis not present

## 2015-09-20 DIAGNOSIS — N400 Enlarged prostate without lower urinary tract symptoms: Secondary | ICD-10-CM | POA: Diagnosis not present

## 2015-09-20 DIAGNOSIS — R972 Elevated prostate specific antigen [PSA]: Secondary | ICD-10-CM | POA: Diagnosis not present

## 2015-10-15 ENCOUNTER — Ambulatory Visit: Payer: Medicare Other | Admitting: Cardiovascular Disease

## 2015-10-28 DIAGNOSIS — Z79899 Other long term (current) drug therapy: Secondary | ICD-10-CM | POA: Diagnosis not present

## 2015-10-28 DIAGNOSIS — I10 Essential (primary) hypertension: Secondary | ICD-10-CM | POA: Diagnosis not present

## 2015-10-28 DIAGNOSIS — E038 Other specified hypothyroidism: Secondary | ICD-10-CM | POA: Diagnosis not present

## 2015-11-01 ENCOUNTER — Ambulatory Visit (INDEPENDENT_AMBULATORY_CARE_PROVIDER_SITE_OTHER): Payer: Medicare Other | Admitting: Cardiovascular Disease

## 2015-11-01 ENCOUNTER — Encounter: Payer: Self-pay | Admitting: Cardiovascular Disease

## 2015-11-01 VITALS — BP 128/76 | HR 77 | Ht 72.0 in | Wt 189.6 lb

## 2015-11-01 DIAGNOSIS — I10 Essential (primary) hypertension: Secondary | ICD-10-CM | POA: Diagnosis not present

## 2015-11-01 DIAGNOSIS — I482 Chronic atrial fibrillation, unspecified: Secondary | ICD-10-CM

## 2015-11-01 DIAGNOSIS — I519 Heart disease, unspecified: Secondary | ICD-10-CM | POA: Diagnosis not present

## 2015-11-01 DIAGNOSIS — E785 Hyperlipidemia, unspecified: Secondary | ICD-10-CM

## 2015-11-01 NOTE — Assessment & Plan Note (Signed)
history of moderate to severe LV dysfunction with an EF in the 30% range by 2-D echocardiogram 9/19/16with cardiac cath performed 12/24/14 revealing normal coronary arteries. He has since had an ICD implanted by Dr. Royann Shivers which was followed up in the office 08/09/15. He is a symptomatic on appropriate medical therapy.

## 2015-11-01 NOTE — Assessment & Plan Note (Signed)
History of hyperlipidemia not on statin therapy with recent lipid profile performed 12/18/14 revealing total cholesterol of 164, LDL 79 and HDL of 43

## 2015-11-01 NOTE — Assessment & Plan Note (Signed)
History of hypertension blood pressure measured 120/76. He is on carvedilol and lisinopril. Continue current meds at current dosing

## 2015-11-01 NOTE — Assessment & Plan Note (Signed)
History of chronic atrial fibrillation rate controlled on Xarelto oral anticoagulation. 

## 2015-11-01 NOTE — Patient Instructions (Signed)
Your physician recommends that you continue on your current medications as directed. Please refer to the Current Medication list given to you today.  Dr Berry recommends that you schedule a follow-up appointment in 1 year. You will receive a reminder letter in the mail two months in advance. If you don't receive a letter, please call our office to schedule the follow-up appointment.  If you need a refill on your cardiac medications before your next appointment, please call your pharmacy. 

## 2015-11-01 NOTE — Progress Notes (Signed)
11/01/2015 Marc Potter   [birth year]  [id number]  Primary Physician Nonnie Done., MD Primary Cardiologist: Runell Gess MD Roseanne Reno   HPI:  Mr. Villa is a 70 year old moderately overweight married Caucasian male father of one biologic and 2 stepchildren, grandfather and 6 grandchildren whose mother-in-law, [name] , is a patient of mine as well. I last saw him in the office 04/17/15 .He has history of hypertension and hyperlipidemia. He is a normal 2-D echo Myoview in the past. He had PAF on event monitoring she was symptomatic from it at that point several years ago I elected to begin him on aspirin alone. He does admit to mild increasing dyspnea on exertion but denies chest pain. He underwent outpatient diagnostic coronary arteriography by myself on 12/24/14 via the right radial approach revealing normal coronary arteries and severe LV dysfunction with an EF of 30%. His dyspnea has significantly improved. He remains in A. Fib with a ventricular response in the 70s today. He is on Savaysa oral anticoagulation. His carvedilol was titrated to 12.5 mg by mouth twice a day and ultimately up to 25 mg twice a day with the addition of lisinopril as well. Recent 2-D echo performed 03/04/15 revealed a persistent diminished EF in the 25-30% range although he is not symptomatic. I referred him to Dr. Royann Shivers for consideration of ICD implantation for primary prevention of sudden cardiac death. This was performed 20-Jun-2015 uneventfully and followed up in the office 2 months later.    Current Outpatient Prescriptions  Medication Sig Dispense Refill  . carvedilol (COREG) 25 MG tablet Take 1 tablet (25 mg total) by mouth 2 (two) times daily. 180 tablet 3  . Coenzyme Q10 (CO Q-10) 100 MG CAPS Take 1 tablet by mouth daily.    [name] glucosamine-chondroitin 500-400 MG tablet Take 1 tablet by mouth 2 (two) times daily.    [name] lisinopril (PRINIVIL,ZESTRIL) 10 MG tablet Take 1 tablet (10 mg  total) by mouth daily. 90 tablet 3  . Multiple Vitamin (MULTIVITAMIN) tablet Take 1 tablet by mouth daily.    . multivitamin-lutein (OCUVITE-LUTEIN) CAPS capsule Take 1 capsule by mouth daily.    . rivaroxaban (XARELTO) 20 MG TABS tablet Take 1 tablet (20 mg total) by mouth daily with supper. 90 tablet 3  . thyroid (ARMOUR) 90 MG tablet Take 90 mg by mouth daily.     No current facility-administered medications for this visit.    No Known Allergies  Social History   Social History  . Marital Status: Married    Spouse Name: N/A  . Number of Children: N/A  . Years of Education: N/A   Occupational History  . Not on file.   Social History Main Topics  . Smoking status: Former Smoker    Quit date: 06/15/1993  . Smokeless tobacco: Never Used  . Alcohol Use: Yes     Comment: DRINKS ON OCCASION   . Drug Use: No  . Sexual Activity: Not on file   Other Topics Concern  . Not on file   Social History Narrative     Review of Systems: General: negative for chills, fever, night sweats or weight changes.  Cardiovascular: negative for chest pain, dyspnea on exertion, edema, orthopnea, palpitations, paroxysmal nocturnal dyspnea or shortness of breath Dermatological: negative for rash Respiratory: negative for cough or wheezing Urologic: negative for hematuria Abdominal: negative for nausea, vomiting, diarrhea, bright red blood per rectum, melena, or hematemesis Neurologic: negative for visual changes, syncope, or dizziness All  other systems reviewed and are otherwise negative except as noted above.    Blood pressure 128/76, pulse 77, height 6' (1.829 m), weight 189 lb 9.6 oz (86.002 kg), SpO2 98 %.  General appearance: alert and no distress Neck: no adenopathy, no carotid bruit, no JVD, supple, symmetrical, trachea midline and thyroid not enlarged, symmetric, no tenderness/mass/nodules Lungs: clear to auscultation bilaterally Heart: irregularly irregular rhythm Extremities:  extremities normal, atraumatic, no cyanosis or edema  EKG atrial fibrillation with a ventricular response of 77 and nonspecific ST and T-wave changes. I personally reviewed this EKG  ASSESSMENT AND PLAN:   Essential hypertension History of hypertension blood pressure measured 120/76. He is on carvedilol and lisinopril. Continue current meds at current dosing  Hyperlipidemia History of hyperlipidemia not on statin therapy with recent lipid profile performed 12/18/14 revealing total cholesterol of 164, LDL 79 and HDL of 43  Atrial fibrillation (HCC) History of chronic atrial fibrillation rate controlled on Xarelto oral anticoagulation.  Left ventricular dysfunction history of moderate to severe LV dysfunction with an EF in the 30% range by 2-D echocardiogram 9/19/16with cardiac cath performed 12/24/14 revealing normal coronary arteries. He has since had an ICD implanted by Dr. Royann Shivers which was followed up in the office 08/09/15. He is a symptomatic on appropriate medical therapy.      Runell Gess MD FACP,FACC,FAHA, [facility] 11/01/2015 10:07 AM

## 2015-11-11 LAB — CUP PACEART INCLINIC DEVICE CHECK
Battery Remaining Longevity: 132 mo
Date Time Interrogation Session: 20170222201207
HighPow Impedance: 64 Ohm
Implantable Lead Implant Date: 20161220
Lead Channel Impedance Value: 342 Ohm
Lead Channel Pacing Threshold Pulse Width: 0.4 ms
Lead Channel Setting Pacing Pulse Width: 0.4 ms
Lead Channel Setting Sensing Sensitivity: 0.3 mV
MDC IDC LEAD LOCATION: 753860
MDC IDC MSMT BATTERY VOLTAGE: 3.06 V
MDC IDC MSMT LEADCHNL RV IMPEDANCE VALUE: 399 Ohm
MDC IDC MSMT LEADCHNL RV PACING THRESHOLD AMPLITUDE: 0.75 V
MDC IDC MSMT LEADCHNL RV SENSING INTR AMPL: 12.25 mV
MDC IDC MSMT LEADCHNL RV SENSING INTR AMPL: 12.25 mV
MDC IDC SET LEADCHNL RV PACING AMPLITUDE: 3 V
MDC IDC STAT BRADY RV PERCENT PACED: 0.17 %

## 2015-11-13 ENCOUNTER — Telehealth: Payer: Self-pay | Admitting: Cardiovascular Disease

## 2015-11-13 NOTE — Telephone Encounter (Signed)
New Message  Pt called states that he is not sure how to send the remote check. Please call back

## 2015-11-13 NOTE — Telephone Encounter (Signed)
Spoke with patient- transmission should come through automatically as Marc Potter as he sleeps within 4-6 feet of the monitor. He verbalizes understanding. He asked about using a backpack leaf blower- I advised that he use a "buddy method" and have someone near by the first time he uses the leaf blower in the unlikely event that the ICD senses noise from motor. He is agreeable.

## 2015-11-14 ENCOUNTER — Telehealth: Payer: Self-pay | Admitting: Cardiology

## 2015-11-14 ENCOUNTER — Ambulatory Visit (INDEPENDENT_AMBULATORY_CARE_PROVIDER_SITE_OTHER): Payer: Medicare Other | Admitting: *Deleted

## 2015-11-14 DIAGNOSIS — I428 Other cardiomyopathies: Secondary | ICD-10-CM

## 2015-11-14 DIAGNOSIS — I429 Cardiomyopathy, unspecified: Secondary | ICD-10-CM | POA: Diagnosis not present

## 2015-11-14 DIAGNOSIS — Z95810 Presence of automatic (implantable) cardiac defibrillator: Secondary | ICD-10-CM | POA: Diagnosis not present

## 2015-11-14 NOTE — Progress Notes (Signed)
EPIC Encounter for ICM Monitoring  Patient Name: Marc Potter is a 70 y.o. male Date: 11/14/2015 Primary Care Physican: Nonnie Done., MD Primary Cardiologist: Allyson Sabal Electrophysiologist: Croitoru Dry Weight: 181 lbs       In the past month, have you:  1. Gained more than 2 pounds in a day or more than 5 pounds in a week? no  2. Had changes in your medications (with verification of current medications)? no  3. Had more shortness of breath than is usual for you? no  4. Limited your activity because of shortness of breath? no  5. Not been able to sleep because of shortness of breath? no  6. Had increased swelling in your feet, ankles, legs or stomach area? no  7. Had symptoms of dehydration (dizziness, dry mouth, increased thirst, decreased urine output) no  8. Had changes in sodium restriction? no  9. Been compliant with medication? Yes  ICM trend: 3 month view for 11/14/2015   ICM trend: 1 year view for 11/14/2015   Follow-up plan: ICM clinic phone appointment 12/18/2015.   1st ICM encounter.  Patient is self referred from [facility] support group meeting.  Patient called requesting to start ICM monthly follow.  Transmission received today.  Explained ICM program and he agreed to monthly ICM follow up.     FLUID LEVELS:  Optivol thoracic impedance decreased 10/21/2015 to 10/25/2015 suggesting 11/03/2015 to 11/08/2015 fluid accumulation and returned to baseline 11/08/2015.    SYMPTOMS:   Reviewed fluid symptoms to report such as weight gain of 3 pounds overnight or 5 pounds within a week, SOB and/or lower extremity swelling.  He denied any symptoms today.  Encouraged to call for any fluid symptoms and provided number.   EDUCATION: Limit sodium intake to < 2000 mg and fluid intake to 64 oz daily.     RECOMMENDATIONS: No changes today.    Advised will send to PCP, Dr. Allyson Sabal and Dr. Royann Shivers regarding ICM enrollment at request of patient following ICD support group meeting.     Karie Soda, RN, CCM 11/14/2015 2:25 PM

## 2015-11-14 NOTE — Telephone Encounter (Signed)
Spoke with pt and reminded pt of remote transmission that is due today. Pt verbalized understanding.   

## 2015-11-15 NOTE — Progress Notes (Signed)
Remote ICD transmission.   

## 2015-11-21 NOTE — Progress Notes (Signed)
Thurmon Fair, MD   Sent: Thu November 14, 2015 2:43 PM    To: Karie Soda, RN        Message     Thanks

## 2015-11-28 LAB — CUP PACEART REMOTE DEVICE CHECK
Battery Remaining Longevity: 130 mo
Brady Statistic RV Percent Paced: 0.13 %
Date Time Interrogation Session: 20170601172212
HIGH POWER IMPEDANCE MEASURED VALUE: 68 Ohm
Lead Channel Impedance Value: 342 Ohm
Lead Channel Impedance Value: 399 Ohm
Lead Channel Sensing Intrinsic Amplitude: 12.375 mV
Lead Channel Setting Pacing Pulse Width: 0.4 ms
Lead Channel Setting Sensing Sensitivity: 0.3 mV
MDC IDC LEAD IMPLANT DT: 20161220
MDC IDC LEAD LOCATION: 753860
MDC IDC MSMT BATTERY VOLTAGE: 3.04 V
MDC IDC MSMT LEADCHNL RV PACING THRESHOLD AMPLITUDE: 0.75 V
MDC IDC MSMT LEADCHNL RV PACING THRESHOLD PULSEWIDTH: 0.4 ms
MDC IDC MSMT LEADCHNL RV SENSING INTR AMPL: 12.375 mV
MDC IDC SET LEADCHNL RV PACING AMPLITUDE: 2.5 V

## 2015-11-29 DIAGNOSIS — H2513 Age-related nuclear cataract, bilateral: Secondary | ICD-10-CM | POA: Diagnosis not present

## 2015-11-29 DIAGNOSIS — H353132 Nonexudative age-related macular degeneration, bilateral, intermediate dry stage: Secondary | ICD-10-CM | POA: Diagnosis not present

## 2015-11-29 DIAGNOSIS — Z0100 Encounter for examination of eyes and vision without abnormal findings: Secondary | ICD-10-CM | POA: Diagnosis not present

## 2015-12-04 ENCOUNTER — Encounter: Payer: Self-pay | Admitting: Cardiology

## 2015-12-14 DIAGNOSIS — M545 Low back pain: Secondary | ICD-10-CM | POA: Diagnosis not present

## 2015-12-19 ENCOUNTER — Ambulatory Visit (INDEPENDENT_AMBULATORY_CARE_PROVIDER_SITE_OTHER): Payer: Medicare Other

## 2015-12-19 DIAGNOSIS — I429 Cardiomyopathy, unspecified: Secondary | ICD-10-CM

## 2015-12-19 DIAGNOSIS — Z95810 Presence of automatic (implantable) cardiac defibrillator: Secondary | ICD-10-CM | POA: Diagnosis not present

## 2015-12-19 DIAGNOSIS — I428 Other cardiomyopathies: Secondary | ICD-10-CM

## 2015-12-20 ENCOUNTER — Telehealth: Payer: Self-pay

## 2015-12-20 NOTE — Telephone Encounter (Signed)
Remote ICM transmission received.  Attempted patient call and left message for return call.   

## 2015-12-20 NOTE — Progress Notes (Signed)
EPIC Encounter for ICM Monitoring  Patient Name: Marc Potter is a 70 y.o. male Date: 12/20/2015 Primary Care Physican: Nonnie Done., MD Primary Cardiologist: Allyson Sabal Electrophysiologist: Croitoru Dry Weight: unknown      Attempted patient call and unable to reach.   Thoracic impedence below reference line since 12/12/2015 but starting to trend toward baseline 12/18/2015.  Recommendations: Unable to reach.  Currently not prescribed diuretic.   ICM trend: 12/19/2015     Follow-up plan: ICM clinic phone appointment on 01/20/2016.  Copy of ICM check sent to device physician.   Karie Soda, RN 12/20/2015 9:15 AM

## 2015-12-23 NOTE — Progress Notes (Signed)
Received call back from patient and transmission reviewed.  He report no fluid symptoms.  Education given on limiting salt to 2000 mg daily and fluid intake to 64 oz daily.  He stated he did eat ham yesterday and today.  He reported he does not use salt shaker at home.  Advised to check food labels for sodium content since there is normally high amounts in grocery store foods.   Weight stable around 181 lbs.  Advised to call for any fluid symptoms.  No recommendations today.  Next remote ICM transmission 01/20/2016.

## 2016-01-20 ENCOUNTER — Telehealth: Payer: Self-pay

## 2016-01-20 ENCOUNTER — Ambulatory Visit (INDEPENDENT_AMBULATORY_CARE_PROVIDER_SITE_OTHER): Payer: Medicare Other

## 2016-01-20 DIAGNOSIS — I429 Cardiomyopathy, unspecified: Secondary | ICD-10-CM | POA: Diagnosis not present

## 2016-01-20 DIAGNOSIS — Z95810 Presence of automatic (implantable) cardiac defibrillator: Secondary | ICD-10-CM | POA: Diagnosis not present

## 2016-01-20 DIAGNOSIS — I428 Other cardiomyopathies: Secondary | ICD-10-CM

## 2016-01-20 NOTE — Progress Notes (Signed)
EPIC Encounter for ICM Monitoring  Patient Name: Marc Potter is a 70 y.o. male Date: 01/20/2016 Primary Care Physican: Nonnie Done., MD Primary Cardiologist: Allyson Sabal Electrophysiologist: Croitoru Dry Weight: 185 lb       Heart Failure questions reviewed, pt asymptomatic.  Thoracic impedance abnormal suggesting fluid accumulation since 01/14/2016.  He reported eating at restaurants a lot.   Recommendations:  No changes.  Discussed at length low sodium diet and provided options for lower sodium alternatives.  Currently not prescribed diuretic.   ICM trend: 01/20/2016     Follow-up plan: ICM clinic phone appointment on 02/20/2016.  Copy of ICM check sent to primary cardiologist and device physician.   Karie Soda, RN 01/20/2016 9:11 AM

## 2016-01-20 NOTE — Telephone Encounter (Signed)
Remote ICM transmission received.  Attempted patient call and left message for return call.   

## 2016-02-20 ENCOUNTER — Ambulatory Visit (INDEPENDENT_AMBULATORY_CARE_PROVIDER_SITE_OTHER): Payer: Medicare Other | Admitting: *Deleted

## 2016-02-20 DIAGNOSIS — Z95810 Presence of automatic (implantable) cardiac defibrillator: Secondary | ICD-10-CM | POA: Diagnosis not present

## 2016-02-20 DIAGNOSIS — I428 Other cardiomyopathies: Secondary | ICD-10-CM

## 2016-02-20 DIAGNOSIS — I429 Cardiomyopathy, unspecified: Secondary | ICD-10-CM | POA: Diagnosis not present

## 2016-02-20 NOTE — Progress Notes (Signed)
Remote ICD transmission.   

## 2016-02-21 NOTE — Progress Notes (Signed)
EPIC Encounter for ICM Monitoring  Patient Name: Marc Potter is a 70 y.o. male Date: 02/21/2016 Primary Care Physican: Nonnie Done., MD Primary Cardiologist: Allyson Sabal Electrophysiologist: Croitoru Dry Weight: 185 lb        Heart Failure questions reviewed, pt asymptomatic   Thoracic impedance normal.   Recommendations: No changes.  Eating out at restaurants.  Currently not prescribed diuretic  Follow-up plan: ICM clinic phone appointment on 03/23/2016.  Copy of ICM check sent to device physician.   ICM trend: 02/20/2016       Karie Soda, RN 02/21/2016 12:08 PM

## 2016-02-24 ENCOUNTER — Encounter: Payer: Self-pay | Admitting: Cardiology

## 2016-03-03 LAB — CUP PACEART REMOTE DEVICE CHECK
Brady Statistic RV Percent Paced: 0.13 %
Date Time Interrogation Session: 20170907073522
HIGH POWER IMPEDANCE MEASURED VALUE: 70 Ohm
Implantable Lead Implant Date: 20161220
Implantable Lead Location: 753860
Lead Channel Impedance Value: 380 Ohm
Lead Channel Impedance Value: 437 Ohm
Lead Channel Pacing Threshold Amplitude: 0.75 V
Lead Channel Sensing Intrinsic Amplitude: 13.75 mV
Lead Channel Setting Sensing Sensitivity: 0.3 mV
MDC IDC MSMT BATTERY REMAINING LONGEVITY: 128 mo
MDC IDC MSMT BATTERY VOLTAGE: 3.03 V
MDC IDC MSMT LEADCHNL RV PACING THRESHOLD PULSEWIDTH: 0.4 ms
MDC IDC MSMT LEADCHNL RV SENSING INTR AMPL: 13.75 mV
MDC IDC SET LEADCHNL RV PACING AMPLITUDE: 2.5 V
MDC IDC SET LEADCHNL RV PACING PULSEWIDTH: 0.4 ms

## 2016-03-20 DIAGNOSIS — Z8042 Family history of malignant neoplasm of prostate: Secondary | ICD-10-CM | POA: Diagnosis not present

## 2016-03-20 DIAGNOSIS — R972 Elevated prostate specific antigen [PSA]: Secondary | ICD-10-CM | POA: Diagnosis not present

## 2016-03-20 DIAGNOSIS — R3912 Poor urinary stream: Secondary | ICD-10-CM | POA: Diagnosis not present

## 2016-03-20 DIAGNOSIS — N401 Enlarged prostate with lower urinary tract symptoms: Secondary | ICD-10-CM | POA: Diagnosis not present

## 2016-03-23 ENCOUNTER — Ambulatory Visit (INDEPENDENT_AMBULATORY_CARE_PROVIDER_SITE_OTHER): Payer: Medicare Other

## 2016-03-23 ENCOUNTER — Telehealth: Payer: Self-pay

## 2016-03-23 DIAGNOSIS — Z95810 Presence of automatic (implantable) cardiac defibrillator: Secondary | ICD-10-CM

## 2016-03-23 DIAGNOSIS — I428 Other cardiomyopathies: Secondary | ICD-10-CM

## 2016-03-23 NOTE — Progress Notes (Signed)
EPIC Encounter for ICM Monitoring  Patient Name: Marc Potter is a 70 y.o. male Date: 03/23/2016 Primary Care Physican: Nonnie Done., MD Primary Cardiologist:Berry Electrophysiologist: Croitoru Dry Weight: unknown       Spoke with wife (DPR). Heart Failure questions reviewed, pt asymptomatic   Thoracic impedance normal   Recommendations: No changes.    Currently not prescribed diuretic  Follow-up plan: ICM clinic phone appointment on 04/23/2016.  Copy of ICM check sent to device physician.   ICM trend: 03/23/2016       Karie Soda, RN 03/23/2016 8:29 AM

## 2016-03-23 NOTE — Telephone Encounter (Signed)
New message   Pt verbalized that he is returning call for report

## 2016-03-24 NOTE — Telephone Encounter (Signed)
See ICM note 

## 2016-04-13 ENCOUNTER — Other Ambulatory Visit: Payer: Self-pay | Admitting: Cardiovascular Disease

## 2016-04-13 ENCOUNTER — Other Ambulatory Visit: Payer: Self-pay | Admitting: *Deleted

## 2016-04-13 MED ORDER — CARVEDILOL 25 MG PO TABS: 25.0000 mg | ORAL_TABLET | Freq: Two times a day (BID) | ORAL | 3 refills | Status: DC

## 2016-04-13 NOTE — Telephone Encounter (Signed)
Rx request sent to pharmacy.  

## 2016-04-13 NOTE — Telephone Encounter (Signed)
Follow up      *STAT* If patient is at the pharmacy, call can be transferred to refill team.   1. Which medications need to be refilled? (please list name of each medication and dose if known) xarelto 20mg   2. Which pharmacy/location (including street and city if local pharmacy) is medication to be sent to? [facility]  [phone]   3. Do they need a 30 day or 90 day supply? 90

## 2016-04-23 ENCOUNTER — Ambulatory Visit (INDEPENDENT_AMBULATORY_CARE_PROVIDER_SITE_OTHER): Payer: Medicare Other

## 2016-04-23 DIAGNOSIS — I428 Other cardiomyopathies: Secondary | ICD-10-CM

## 2016-04-23 DIAGNOSIS — Z95810 Presence of automatic (implantable) cardiac defibrillator: Secondary | ICD-10-CM

## 2016-04-23 NOTE — Progress Notes (Signed)
EPIC Encounter for ICM Monitoring  Patient Name: Marc Potter is a 70 y.o. male Date: 04/23/2016 Primary Care Physican: Nonnie Done., MD Primary Cardiologist:Berry Electrophysiologist: Croitoru Dry Weight:    unknown      Heart Failure questions reviewed, pt asymptomatic   Thoracic impedance normal since 04/16/2016.  Impedance was abnormal suggesting fluid accumulation 10/19 to 11/2 and he reported being on vacation during that time.  Recommendations: No changes.  Advised to limit salt intake to 2000 mg daily and to monitor closely during holidays.  Encouraged to call for fluid symptoms.  He reported Xarelto will not be covered by his insurance starting January 1 and needs assistance to obtain the information and message sent to Dr [employer] nurse.  Follow-up plan: ICM clinic phone appointment on 05/26/2016.  Copy of ICM check sent to device physician.   ICM trend: 04/23/2016       Karie Soda, RN 04/23/2016 2:55 PM

## 2016-04-27 NOTE — Progress Notes (Signed)
Marc Potter, The Xarelto rep stopped by to tell me that the info that Xarelto will not be covered by Medicare D plans is a mistake. Could you please confirm that we can continue Rxing Xarelto for this patient? Thanks [employer]

## 2016-05-06 ENCOUNTER — Telehealth: Payer: Self-pay

## 2016-05-06 NOTE — Telephone Encounter (Signed)
-----   Message from Karie Soda, RN sent at 04/23/2016  3:05 PM EST ----- Regarding: Marc Potter,  This patient said Xarelto would not be covered by his insurance starting in January and he needs assistance to obtain medication.    Thanks, Randon Goldsmith, RN

## 2016-05-06 NOTE — Telephone Encounter (Signed)
Follow up ° ° ° °Pt verbalized that he is returning call °

## 2016-05-06 NOTE — Telephone Encounter (Signed)
lmtcb

## 2016-05-11 ENCOUNTER — Other Ambulatory Visit: Payer: Self-pay | Admitting: *Deleted

## 2016-05-11 DIAGNOSIS — I519 Heart disease, unspecified: Secondary | ICD-10-CM

## 2016-05-11 DIAGNOSIS — Z79899 Other long term (current) drug therapy: Secondary | ICD-10-CM

## 2016-05-11 DIAGNOSIS — I10 Essential (primary) hypertension: Secondary | ICD-10-CM

## 2016-05-11 DIAGNOSIS — I482 Chronic atrial fibrillation, unspecified: Secondary | ICD-10-CM

## 2016-05-11 MED ORDER — LISINOPRIL 10 MG PO TABS: 10.0000 mg | ORAL_TABLET | Freq: Every day | ORAL | 3 refills | Status: DC

## 2016-05-11 NOTE — Telephone Encounter (Signed)
Patient called about getting refills sent to his [place] pharmacy due to being there for a while.

## 2016-05-26 ENCOUNTER — Ambulatory Visit (INDEPENDENT_AMBULATORY_CARE_PROVIDER_SITE_OTHER): Payer: Medicare Other | Admitting: *Deleted

## 2016-05-26 DIAGNOSIS — I428 Other cardiomyopathies: Secondary | ICD-10-CM | POA: Diagnosis not present

## 2016-05-26 DIAGNOSIS — Z95810 Presence of automatic (implantable) cardiac defibrillator: Secondary | ICD-10-CM

## 2016-05-26 NOTE — Progress Notes (Signed)
Remote ICD transmission.   

## 2016-05-26 NOTE — Progress Notes (Signed)
Thanks

## 2016-05-26 NOTE — Progress Notes (Signed)
EPIC Encounter for ICM Monitoring  Patient Name: Marc Potter is a 70 y.o. male Date: 05/26/2016 Primary Care Physican: Nonnie Done., MD Primary Cardiologist:Berry Electrophysiologist: Croitoru Dry Weight:178 lbs                 Heart Failure questions reviewed, pt asymptomatic   Thoracic impedance at baseline normal with exception of a few days below baseline suggesting fluid accumulation.  Recommendations:   No changes.  Reinforced to limit low salt food choices to 2000 mg day and limiting fluid intake to < 2 liters per day. Encouraged to call for fluid symptoms.    Follow-up plan: ICM clinic phone appointment on 06/26/2016.  Copy of ICM check sent to device physician.   ICM trend: 05/26/2016       Karie Soda, RN 05/26/2016 3:10 PM

## 2016-06-03 ENCOUNTER — Encounter: Payer: Self-pay | Admitting: Cardiology

## 2016-06-07 ENCOUNTER — Emergency Department (HOSPITAL_COMMUNITY): Payer: Medicare Other

## 2016-06-07 ENCOUNTER — Telehealth: Payer: Self-pay | Admitting: Cardiology

## 2016-06-07 ENCOUNTER — Emergency Department (HOSPITAL_BASED_OUTPATIENT_CLINIC_OR_DEPARTMENT_OTHER)
Admit: 2016-06-07 | Discharge: 2016-06-07 | Disposition: A | Discharge status: Home / Self Care | Payer: Medicare Other | Attending: Emergency Medicine | Admitting: Emergency Medicine

## 2016-06-07 ENCOUNTER — Encounter (HOSPITAL_COMMUNITY): Payer: Self-pay

## 2016-06-07 ENCOUNTER — Inpatient Hospital Stay (HOSPITAL_COMMUNITY)
Admission: EM | Admit: 2016-06-07 | Discharge: 2016-06-09 | DRG: 300 | Disposition: A | Discharge status: Home / Self Care | Payer: Medicare Other | Attending: Internal Medicine | Admitting: Internal Medicine

## 2016-06-07 DIAGNOSIS — Z7901 Long term (current) use of anticoagulants: Secondary | ICD-10-CM

## 2016-06-07 DIAGNOSIS — E039 Hypothyroidism, unspecified: Secondary | ICD-10-CM | POA: Diagnosis present

## 2016-06-07 DIAGNOSIS — I110 Hypertensive heart disease with heart failure: Secondary | ICD-10-CM | POA: Diagnosis present

## 2016-06-07 DIAGNOSIS — I5042 Chronic combined systolic (congestive) and diastolic (congestive) heart failure: Secondary | ICD-10-CM | POA: Diagnosis not present

## 2016-06-07 DIAGNOSIS — Z87891 Personal history of nicotine dependence: Secondary | ICD-10-CM

## 2016-06-07 DIAGNOSIS — M79642 Pain in left hand: Secondary | ICD-10-CM | POA: Diagnosis not present

## 2016-06-07 DIAGNOSIS — M7989 Other specified soft tissue disorders: Secondary | ICD-10-CM

## 2016-06-07 DIAGNOSIS — Z95810 Presence of automatic (implantable) cardiac defibrillator: Secondary | ICD-10-CM | POA: Diagnosis present

## 2016-06-07 DIAGNOSIS — I82B12 Acute embolism and thrombosis of left subclavian vein: Secondary | ICD-10-CM | POA: Diagnosis not present

## 2016-06-07 DIAGNOSIS — I82A12 Acute embolism and thrombosis of left axillary vein: Secondary | ICD-10-CM | POA: Diagnosis not present

## 2016-06-07 DIAGNOSIS — I519 Heart disease, unspecified: Secondary | ICD-10-CM | POA: Diagnosis present

## 2016-06-07 DIAGNOSIS — I482 Chronic atrial fibrillation: Secondary | ICD-10-CM | POA: Diagnosis not present

## 2016-06-07 DIAGNOSIS — I82622 Acute embolism and thrombosis of deep veins of left upper extremity: Secondary | ICD-10-CM | POA: Diagnosis present

## 2016-06-07 DIAGNOSIS — M79632 Pain in left forearm: Secondary | ICD-10-CM | POA: Diagnosis not present

## 2016-06-07 DIAGNOSIS — Z8249 Family history of ischemic heart disease and other diseases of the circulatory system: Secondary | ICD-10-CM

## 2016-06-07 LAB — COMPREHENSIVE METABOLIC PANEL
ALBUMIN: 3.7 g/dL (ref 3.5–5.0)
ALT: 25 U/L (ref 17–63)
ANION GAP: 2 — AB (ref 5–15)
AST: 29 U/L (ref 15–41)
Alkaline Phosphatase: 48 U/L (ref 38–126)
BUN: 14 mg/dL (ref 6–20)
CO2: 26 mmol/L (ref 22–32)
Calcium: 9 mg/dL (ref 8.9–10.3)
Chloride: 111 mmol/L (ref 101–111)
Creatinine, Ser: 0.89 mg/dL (ref 0.61–1.24)
GFR calc non Af Amer: >60 mL/min (ref >60)
GLUCOSE: 113 mg/dL — AB (ref 65–99)
POTASSIUM: 4.4 mmol/L (ref 3.5–5.1)
SODIUM: 139 mmol/L (ref 135–145)
Total Bilirubin: 0.8 mg/dL (ref 0.3–1.2)
Total Protein: 5.7 g/dL — ABNORMAL LOW (ref 6.5–8.1)

## 2016-06-07 LAB — PROTIME-INR
INR: 1.22
PROTHROMBIN TIME: 15.5 s — AB (ref 11.4–15.2)

## 2016-06-07 LAB — CBC WITH DIFFERENTIAL/PLATELET
BASOS PCT: 1 %
Basophils Absolute: 0 10*3/uL (ref 0.0–0.1)
EOS ABS: 0.2 10*3/uL (ref 0.0–0.7)
EOS PCT: 3 %
HCT: 38.9 % — ABNORMAL LOW (ref 39.0–52.0)
Hemoglobin: 13.3 g/dL (ref 13.0–17.0)
Lymphocytes Relative: 33 %
Lymphs Abs: 1.7 10*3/uL (ref 0.7–4.0)
MCH: 31.6 pg (ref 26.0–34.0)
MCHC: 34.2 g/dL (ref 30.0–36.0)
MCV: 92.4 fL (ref 78.0–100.0)
MONO ABS: 0.4 10*3/uL (ref 0.1–1.0)
MONOS PCT: 8 %
NEUTROS PCT: 55 %
Neutro Abs: 2.9 10*3/uL (ref 1.7–7.7)
PLATELETS: 153 10*3/uL (ref 150–400)
RBC: 4.21 MIL/uL — ABNORMAL LOW (ref 4.22–5.81)
RDW: 13.1 % (ref 11.5–15.5)
WBC: 5.1 10*3/uL (ref 4.0–10.5)

## 2016-06-07 LAB — HEPARIN LEVEL (UNFRACTIONATED): HEPARIN UNFRACTIONATED: 0.7 [IU]/mL (ref 0.30–0.70)

## 2016-06-07 LAB — APTT: APTT: 32 s (ref 24–36)

## 2016-06-07 MED ORDER — SODIUM CHLORIDE 0.9% FLUSH: 3.0000 mL | Freq: Two times a day (BID) | INTRAVENOUS | Status: DC

## 2016-06-07 MED ORDER — ACETAMINOPHEN 650 MG RE SUPP: 650.0000 mg | Freq: Four times a day (QID) | RECTAL | Status: DC | PRN

## 2016-06-07 MED ORDER — LISINOPRIL 10 MG PO TABS: 10.0000 mg | ORAL_TABLET | Freq: Every day | ORAL | Status: DC

## 2016-06-07 MED ORDER — SODIUM CHLORIDE 0.9% FLUSH: 3.0000 mL | INTRAVENOUS | Status: DC | PRN

## 2016-06-07 MED ORDER — THYROID 60 MG PO TABS
90.0000 mg | ORAL_TABLET | Freq: Every day | ORAL | Status: DC
  Administered 2016-06-08 – 2016-06-09 (×2): 90 mg via ORAL
  Filled 2016-06-07 (×2): qty 1

## 2016-06-07 MED ORDER — SODIUM CHLORIDE 0.9 % IV SOLN: 250.0000 mL | INTRAVENOUS | Status: DC | PRN

## 2016-06-07 MED ORDER — THYROID 60 MG PO TABS: 90.0000 mg | ORAL_TABLET | Freq: Every day | ORAL | Status: DC

## 2016-06-07 MED ORDER — CLINDAMYCIN PHOSPHATE 600 MG/50ML IV SOLN
600.0000 mg | Freq: Once | INTRAVENOUS | Status: AC
  Administered 2016-06-07: 600 mg via INTRAVENOUS
  Filled 2016-06-07: qty 50

## 2016-06-07 MED ORDER — CARVEDILOL 25 MG PO TABS
25.0000 mg | ORAL_TABLET | Freq: Two times a day (BID) | ORAL | Status: DC
  Administered 2016-06-07 – 2016-06-09 (×4): 25 mg via ORAL
  Filled 2016-06-07 (×4): qty 1

## 2016-06-07 MED ORDER — ACETAMINOPHEN 325 MG PO TABS: 650.0000 mg | ORAL_TABLET | Freq: Four times a day (QID) | ORAL | Status: DC | PRN

## 2016-06-07 MED ORDER — HEPARIN (PORCINE) IN NACL 100-0.45 UNIT/ML-% IJ SOLN
1350.0000 [IU]/h | INTRAMUSCULAR | Status: DC
  Administered 2016-06-07 – 2016-06-08 (×2): 1500 [IU]/h via INTRAVENOUS
  Administered 2016-06-09: 1350 [IU]/h via INTRAVENOUS
  Filled 2016-06-07 (×4): qty 250

## 2016-06-07 MED ORDER — LISINOPRIL 10 MG PO TABS
10.0000 mg | ORAL_TABLET | Freq: Every day | ORAL | Status: DC
  Administered 2016-06-07 – 2016-06-09 (×3): 10 mg via ORAL
  Filled 2016-06-07 (×3): qty 1

## 2016-06-07 NOTE — ED Notes (Signed)
Attempted report will call back  

## 2016-06-07 NOTE — Progress Notes (Signed)
*  PRELIMINARY RESULTS* Vascular Ultrasound Left upper extremity venous duplex has been completed.  Preliminary findings: Technically limited but appears to be acute DVT in the left subclavian and axillary veins, based on no flow visualized with color or spectral Doppler. Unable to perform compressions in these areas to truly identify non- compressible vessels. Also, based on continuous flow in the left brachial veins, this would suggest a proximal obstruction.  Called results to Dr. Silverio Lay.   Farrel Demark, RDMS, RVT  06/07/2016, 1:54 PM

## 2016-06-07 NOTE — Progress Notes (Signed)
ANTICOAGULATION CONSULT NOTE - Initial Consult  Pharmacy Consult for Heparin  Indication: VTE Treatment  No Known Allergies  Patient Measurements:   Heparin Dosing Weight: 86 kg  Vital Signs: Temp: 97.7 F (36.5 C) (12/24 1224) Temp Source: Oral (12/24 1224) BP: 149/89 (12/24 1224) Pulse Rate: 79 (12/24 1224)  Labs:  Recent Labs  06/07/16 1258  HGB 13.3  HCT 38.9*  PLT 153  CREATININE 0.89    CrCl cannot be calculated (Unknown ideal weight.).   Medical History: Past Medical History:  Diagnosis Date  . 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 Atrial fibrillation (HCC)   . Heart failure with reduced ejection fraction, NYHA class II (HCC)    a. EF 25-30% by echo in 02/2015  . Heart palpitations   . Hyperlipidemia   . Hypertension   . Left ventricular dysfunction   . Normal coronary arteries    by outpatient diagnostic coronary arteriography performed by myself 12/24/14.    Medications:   (Not in a hospital admission) Scheduled:  Infusions:  PRN:  Anti-infectives    Start     Dose/Rate Route Frequency Ordered Stop   06/07/16 1300  clindamycin (CLEOCIN) IVPB 600 mg     600 mg 100 mL/hr over 30 Minutes Intravenous  Once 06/07/16 1248 06/07/16 1334      Assessment: Mr. Berkman is 70 year old man who presented 12/14 with left arm swelling. [place] of left upper extremity showed possible acute DVT in left subclavian and axillary veins. Pharmacy was consulted for heparin dosing.  Patient was on rivaroxaban prior to admission for A fib. Last dose was reported to be 12/23 at around 1800 per patient. No bleeding noted. Hemoglobin and platelet count within normal limits. Estimated creatinine clearance of 90 mL/min.   Goal of Therapy:  Heparin level 0.3-0.7 units/ml Monitor platelets by anticoagulation protocol: Yes   Plan:  No bolus given prior to admission anticoagulation Start heparin 1500 units/hr IV infusion at 1800 6-hour heparin  level/aPTT  Monitor daily aPTT/HL until levels correlate, then just HL Daily CBC Monitor for signs/symptoms of bleeding Follow up plans for anticoagulation on discharge  Carylon Perches, PharmD Acute Care Pharmacy Resident  Pager: [phone] 06/07/2016

## 2016-06-07 NOTE — ED Provider Notes (Signed)
[facility]-EMER[facility]ENCY DEPT Provider Note   CSN: [id number] Arrival date & time: 06/07/16  1215     History   Chief Complaint Chief Complaint  Patient presents with  . left arm swelling    HPI Marc Potter is a 70 y.o. male hx of afib on xarelto, Hyperlipidemia, hypertension, heart failure with pacemaker here presenting with left upper extremity swelling and pain. Patient states that over the last 2 days, he has progressive left hand and forearm swelling. He may have redness some redness as well. Denies any fevers or chills. Patient states that he is retired and shows works about the house but denies any injuries to his hand. Denies any shortness of breath or palpitations or chest pain.     The history is provided by the patient.    Past Medical History:  Diagnosis Date  . 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 Atrial fibrillation (HCC)   . Heart failure with reduced ejection fraction, NYHA class II (HCC)    a. EF 25-30% by echo in 02/2015  . Heart palpitations   . Hyperlipidemia   . Hypertension   . Left ventricular dysfunction   . Normal coronary arteries    by outpatient diagnostic coronary arteriography performed by myself 12/24/14.    Patient Active Problem List   Diagnosis Date Noted  . ICD (implantable cardioverter-defibrillator) in place 06/04/2015  . Non-ischemic cardiomyopathy (HCC)   . At risk for sudden cardiac death 05/08/2015  . Left ventricular dysfunction 12/18/2014  . Essential hypertension 11/14/2014  . Hyperlipidemia 11/14/2014  . Atrial fibrillation (HCC) 11/14/2014    Past Surgical History:  Procedure Laterality Date  . CARDIAC CATHETERIZATION N/A 12/24/2014   Procedure: Left Heart Cath and Coronary Angiography;  Surgeon: Runell GessJonathan J Berry, MD;  [facility];  Service: Cardiovascular;  Laterality: N/A;  . DOPPLER ECHOCARDIOGRAPHY  2012  . EP IMPLANTABLE DEVICE  06/04/2015  . EP IMPLANTABLE DEVICE N/A  06/04/2015   Procedure: ICD Implant;  Surgeon: Thurmon FairMihai Croitoru, MD;  Location: [facility] CV [facility];  Service: Cardiovascular;  Laterality: N/A;  . NM MYOVIEW LTD  2012       Home Medications    Prior to Admission medications   Medication Sig Start Date End Date Taking? Authorizing Provider  carvedilol (COREG) 25 MG tablet TAKE ONE TABLET BY MOUTH TWICE DAILY 04/13/16   Runell GessJonathan J Berry, MD  carvedilol (COREG) 25 MG tablet Take 1 tablet (25 mg total) by mouth 2 (two) times daily. 04/13/16   Mihai Croitoru, MD  Coenzyme Q10 (CO Q-10) 100 MG CAPS Take 1 tablet by mouth daily.    Historical Provider, MD  glucosamine-chondroitin 500-400 MG tablet Take 1 tablet by mouth 2 (two) times daily.    Historical Provider, MD  lisinopril (PRINIVIL,ZESTRIL) 10 MG tablet Take 1 tablet (10 mg total) by mouth daily. 05/11/16   Runell GessJonathan J Berry, MD  Multiple Vitamin (MULTIVITAMIN) tablet Take 1 tablet by mouth daily.    Historical Provider, MD  multivitamin-lutein (OCUVITE-LUTEIN) CAPS capsule Take 1 capsule by mouth daily.    Historical Provider, MD  rivaroxaban (XARELTO) 20 MG TABS tablet Take 1 tablet (20 mg total) by mouth daily with supper. 09/18/15   Mihai Croitoru, MD  thyroid (ARMOUR) 90 MG tablet Take 90 mg by mouth daily.    Historical Provider, MD    Family History Family History  Problem Relation Age of Onset  . Diabetes Mother   . Hypertension Mother   . Cancer Father   .  Heart failure Father     Social History Social History  Substance Use Topics  . Smoking status: Former Smoker    Quit date: 06/15/1993  . Smokeless tobacco: Never Used  . Alcohol use Yes     Comment: DRINKS ON OCCASION      Allergies   Patient has no known allergies.   Review of Systems Review of Systems  Musculoskeletal:       L arm swelling   All other systems reviewed and are negative.    Physical Exam Updated Vital Signs BP 149/89 (BP Location: Right Arm)   Pulse 79   Temp 97.7 F (36.5 C) (Oral)    Resp 18   SpO2 100%   Physical Exam  Constitutional: He is oriented to person, place, and time.  Chronically ill, NAD   HENT:  Head: Normocephalic.  Eyes: Pupils are equal, round, and reactive to light.  Neck: Normal range of motion.  Cardiovascular: Normal rate, regular rhythm and normal heart sounds.   Pulmonary/Chest: Effort normal and breath sounds normal. No respiratory distress. He has no wheezes.  Mild swelling and tenderness pacemaker site   Abdominal: Soft. Bowel sounds are normal. He exhibits no distension.  Musculoskeletal:  L hand and forearm slightly swollen, ? Mild cellulitis. 2+ pulses, no obvious subcutaneous air   Neurological: He is alert and oriented to person, place, and time.  Skin: Skin is warm. There is erythema.  Psychiatric: He has a normal mood and affect.  Nursing note and vitals reviewed.    ED Treatments / Results  Labs (all labs ordered are listed, but only abnormal results are displayed) Labs Reviewed  CBC WITH DIFFERENTIAL/PLATELET  COMPREHENSIVE METABOLIC PANEL    EKG  EKG Interpretation None       Radiology No results found.  Procedures Procedures (including critical care time)  CRITICAL CARE Performed by: Richardean Canal   Total critical care time: 30 minutes  Critical care time was exclusive of separately billable procedures and treating other patients.  Critical care was necessary to treat or prevent imminent or life-threatening deterioration.  Critical care was time spent personally by me on the following activities: development of treatment plan with patient and/or surrogate as well as nursing, discussions with consultants, evaluation of patient's response to treatment, examination of patient, obtaining history from patient or surrogate, ordering and performing treatments and interventions, ordering and review of laboratory studies, ordering and review of radiographic studies, pulse oximetry and re-evaluation of patient's  condition.   Medications Ordered in ED Medications  clindamycin (CLEOCIN) IVPB 600 mg (600 mg Intravenous New Bag/Given 06/07/16 1304)     Initial Impression / Assessment and Plan / ED Course  I have reviewed the triage vital signs and the nursing notes.  Pertinent labs & imaging results that were available during my care of the patient were reviewed by me and considered in my medical decision making (see chart for details).  Clinical Course    Volney Reierson is a 70 y.o. male here with L hand and forearm swelling. ? Mild cellulitis on exam. Consider thrombophlebitis vs DVT vs cellulitis. Afebrile, well appearing. Will get labs, xrays, DVT study.   2:20 PM DVT study showed possible subclavian and axillary vein DVT. Patient already on xarelto. Consulted Dr. Clelia Croft from heme/onc. I wonder if he had a clot from the pacemaker. Will start on heparin and admit to get echo. Dr. Clelia Croft thinks that he will need to be bridged on heparin and likely need another  anticoagulant as outpatient as he failed xarelto.    Final Clinical Impressions(s) / ED Diagnoses   Final diagnoses:  None    New Prescriptions New Prescriptions   No medications on file     Charlynne Pander, MD 06/07/16 1520

## 2016-06-07 NOTE — ED Notes (Signed)
Pt returned to room and placed back on monitor.  

## 2016-06-07 NOTE — Telephone Encounter (Signed)
PT CALLED ABOUT LEFT WRIST AND LT ARM SWELLING.  ASKED PT TO BE SEEN AT URGENT CARE TO EVAL.  HE IS AGREEABLE.

## 2016-06-07 NOTE — Discharge Summary (Signed)
Name: Marc Potter MRN: [id number] DOB: August 30, 1945 70 y.o. PCP: Enid Skeens, MD  Date of Admission: 06/07/2016 12:25 PM Date of Discharge: 06/09/2016 Attending Physician: Lucious Groves, DO  Discharge Diagnosis: 1. Acute DVT of left upper extremity, Axillary vein and subclavian vein 2. Congestive heart failure with ICD placement 3. Atrial fibrillation Active Problems:   Left ventricular dysfunction   ICD (implantable cardioverter-defibrillator) in place   Acute deep vein thrombosis (DVT) of left upper extremity [facility])   Discharge Medications: Allergies as of 06/09/2016   No Known Allergies     Medication List    STOP taking these medications   rivaroxaban 20 MG Tabs tablet Commonly known as:  XARELTO     TAKE these medications   carvedilol 25 MG tablet Commonly known as:  COREG Take 1 tablet (25 mg total) by mouth 2 (two) times daily. What changed:  Another medication with the same name was removed. Continue taking this medication, and follow the directions you see here.   Co Q-10 100 MG Caps Take 1 tablet by mouth daily.   enoxaparin 150 MG/ML injection Commonly known as:  LOVENOX Inject 0.89 mLs (135 mg total) into the skin daily.   enoxaparin Kit Commonly known as:  LOVENOX 1 each by Does not apply route once.   glucosamine-chondroitin 500-400 MG tablet Take 1 tablet by mouth 2 (two) times daily.   lisinopril 10 MG tablet Commonly known as:  PRINIVIL,ZESTRIL Take 1 tablet (10 mg total) by mouth daily.   multivitamin tablet Take 1 tablet by mouth daily.   multivitamin-lutein Caps capsule Take 1 capsule by mouth daily.   thyroid 90 MG tablet Commonly known as:  ARMOUR Take 90 mg by mouth daily.   warfarin 5 MG tablet Commonly known as:  COUMADIN Take 1 tablet (5 mg total) by mouth daily.       Disposition and follow-up:   MarcDaine Potter was discharged from [facility] in Stable condition.  At the hospital follow  up visit please address:  1.  Compliance with Lovenox and Coumadin as well as symptoms of left upper extremity DVT.  2.  Labs / imaging needed at time of follow-up: INR  3.  Pending labs/ test needing follow-up: None  Follow-up Appointments:   Hospital Course by problem list: Active Problems:   Left ventricular dysfunction   ICD (implantable cardioverter-defibrillator) in place   Acute deep vein thrombosis (DVT) of left upper extremity (HCC)   1. Acute DVT of left upper extremity, axillary vein and subclavian vein Marc Potter is a very pleasant 69 year old male with medical history significant for atrial fibrillation anticoagulated on Xarelto 20 mg daily and congestive heart failure requiring ICD placement who presents with a three-day history of progressively worsening left upper extremity swelling and discoloration. He reports strict compliance with his Xarelto and takes it with his largest meal of the day. Upper extremity ultrasound performed in ED positive for acute DVT of the left axillary vein and left subclavian vein. He was subsequently anticoagulated with IV heparin and the following day noticed significant improvement of his symptoms. Vascular interventional radiology was consultation who recommended continuing anticoagulation and deferred intravascular directed thrombolytics. Case was discussed with cardiology who feel that the eliciting factor was the ICD and recommended changing patient anticoagulation regimen to warfarin. He was subsequently started on heparin with the assistance of pharmacy and at time of discharge was currently being bridged to warfarin. He was discharged home with prescriptions for subcutaneous  Lovenox to be administered twice daily and warfarin 5 mg daily until he can follow up with his primary care provider or cardiologist. Patient was encouraged to follow up with either physician by Friday of this week or early next week for an INR check, goal of  2.5-3.5.  Discharge Vitals:   BP 104/74 (BP Location: Right Arm)   Pulse 95   Temp 97.9 F (36.6 C) (Oral)   Resp 20   Ht _0  (1.753 m)   Wt 199 lb 8.3 oz (90.5 kg)   SpO2 99%   BMI 29.46 kg/m   Pertinent Labs, Studies, and Procedures:  Left approximately ultrasound: Positive for acute DVT of the left axillary and left subclavian veins INR at time of discharge: 1.2  Discharge Instructions: Please call either your primary care office or cardiologist office to set up an appointment either this Friday or early next week for an INR check.  SignedEinar Gip, DO 06/07/2016, 6:36 PM   Pager: [phone]

## 2016-06-07 NOTE — ED Triage Notes (Signed)
Patient here with left arm swelling x 2 days. Denies trauma, complains of tight feeling. Painful to touch. No redness. NAD

## 2016-06-07 NOTE — ED Notes (Signed)
Patient transported to Ultrasound 

## 2016-06-07 NOTE — H&P (Signed)
Date: 06/07/2016               Patient Name:  Marc Potter MRN: [id number]  DOB: 03/12/1946 Age / Sex: 70 y.o., male   PCP: Nonnie Done, MD         Medical Service: Internal Medicine Teaching Service         Attending Physician: Dr. Gust Rung, DO    First Contact: Dr. Noemi Chapel Pager: [phone]  Second Contact: Dr. Valentino Nose Pager: [phone]       After Hours (After 5p/  First Contact Pager: [phone]  weekends / holidays): Second Contact Pager: [phone]   Chief Complaint: left arm swelling  History of Present Illness:  [name] Is a very pleasant 81-year-old male with medical history significant for systolic CHF (EF 57-84%, ICD in place), HTN and Atrial fibrillation anticoagulated on Xarelto 20 mg daily who presents for evaluation of a several-day history of progressive left upper extremity swelling. Patient reports that about 3 days ago he noticed that his left hand was beginning to swell however attributed this to arthritis. Over the next several days the swelling progressed proximally and his wife encouraged him to seek medical attention when the swelling reaches axilla. Patient reports is not painful however is aware of something different in that area. Denies any history of similar prior episodes and denies any history of blood clots including DVT or PE. He also reports compliance with his daily Xarelto 20 mg. Patient has and ICD placed 1 year ago and reports that he feels like something in that area has been moving for the past several weeks. Additionally his wife noticed some skin discoloration over the ICD site and swelling. He denies any shortness of breath, chest pain, leg swelling, leg pain, fevers, chills. He also denies any weight loss, changes in bowel habits, blood in stool, night sweats or difficulty urinating. Endorses a remote history of melanoma and reports he had a normal colonoscopy 7-8 years ago.  In the emergency department, VSS  (97.7,pulse 79, blood pressure 149/89, respirations 18 and was saturating 100% on room air). CBC and CMET wnl. PT/INR wnl as well. X-rays of left forearm and hand were without any acute abnormality. He received 1 dose of Clindamycin in ED prior to results of left upper extremity doppler which returned as an acute DVT of the left subclavian and axillary veins. He was subsequently admitted for evaluation and management of proximal left upper extremity DVT.  M.eds:  Current Meds  Medication Sig  . carvedilol (COREG) 25 MG tablet Take 1 tablet (25 mg total) by mouth 2 (two) times daily.  . Coenzyme Q10 (CO Q-10) 100 MG CAPS Take 1 tablet by mouth daily.  [name] glucosamine-chondroitin 500-400 MG tablet Take 1 tablet by mouth 2 (two) times daily.  [name] lisinopril (PRINIVIL,ZESTRIL) 10 MG tablet Take 1 tablet (10 mg total) by mouth daily.  . Multiple Vitamin (MULTIVITAMIN) tablet Take 1 tablet by mouth daily.  . multivitamin-lutein (OCUVITE-LUTEIN) CAPS capsule Take 1 capsule by mouth daily.  . rivaroxaban (XARELTO) 20 MG TABS tablet Take 1 tablet (20 mg total) by mouth daily with supper.  . thyroid (ARMOUR) 90 MG tablet Take 90 mg by mouth daily.   Allergies: Allergies as of 06/07/2016  . (No Known Allergies)   Past Medical History:  Diagnosis Date  . 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 Atrial fibrillation (HCC)   . Heart failure with reduced ejection fraction, NYHA class II (  HCC)    a. EF 25-30% by echo in 02/2015  . Heart palpitations   . Hyperlipidemia   . Hypertension   . Left ventricular dysfunction   . Normal coronary arteries    by outpatient diagnostic coronary arteriography performed by myself 12/24/14.   Family History:  Mother: DM, HTN Father: CA, CHF  Social History: Former smoker, quit 20+ years ago. Occasional alcohol consumption. Lives with his wife who appears very supportive. Patient and his wife have great health literacy and are good  historians.  Review of Systems: A complete ROS was negative except as per HPI.   CMP Latest Ref Rng & Units 06/07/2016 05/28/2015 03/12/2015  Glucose 65 - 99 mg/dL 622(W) 93 979(G)  BUN 6 - 20 mg/dL 14 16 16   Creatinine 0.61 - 1.24 mg/dL 9.21 1.94 1.74  Sodium 135 - 145 mmol/L 139 141 140  Potassium 3.5 - 5.1 mmol/L 4.4 4.5 4.4  Chloride 101 - 111 mmol/L 111 105 108  CO2 22 - 32 mmol/L 26 24 27   Calcium 8.9 - 10.3 mg/dL 9.0 9.4 9.2  Total Protein 6.5 - 8.1 g/dL 0.8(X) 6.2 -  Total Bilirubin 0.3 - 1.2 mg/dL 0.8 0.8 -  Alkaline Phos 38 - 126 U/L 48 52 -  AST 15 - 41 U/L 29 17 -  ALT 17 - 63 U/L 25 15 -    CBC Latest Ref Rng & Units 06/08/2016 06/07/2016 05/28/2015  WBC 4.0 - 10.5 K/uL 5.1 5.1 4.6  Hemoglobin 13.0 - 17.0 g/dL 12.9(L) 13.3 14.3  Hematocrit 39.0 - 52.0 % 37.6(L) 38.9(L) 41.6  Platelets 150 - 400 K/uL 140(L) 153 175    Physical Exam: Blood pressure 147/90, pulse 73, temperature 97.7 F (36.5 C), temperature source Oral, resp. rate 14, SpO2 97 %. General: Very pleasant. Elderly however otherwise healthy-appearing caucasian male resting comfortably in bed. In no acute distress. Wife present at bedside.  HENT:  [place]/AT. EOMI. No conjunctival injection, icterus or ptosis.  Cardiovascular: Irregularly irregular. No murmur or rub appreciated. ICD in place over left chest wall. Some mild violaceous skin discoloration overlies ICD. Mild edema as well over the site. Intact radial, brachial and posterior tibialis pulses.  Pulmonary: CTA BL, no wheezing, crackles or rhonchi appreciated. Unlabored breathing.  Abdomen: Soft, non-tender and non-distended. No guarding or rigidity. +bowel sounds.  Extremities: Edema of L UE extending from proximal to left axilla down to digits. Non-pitting. Non-tender. Some slight erythematous changes of proximal medial arm. Otherwise WNL. Skin: Warm, dry, well perfused throughout.  Neuro: Strength intact throughout however sensation of LUE diminished.   Psych: Mood normal and affect was mood congruent. Responds to questions appropriately.   Left forearm and hand x-rays: WNL.  Left UE [place]: Acute deep vein thrombosis involving the left subclavian and axillary veins. Unable to perform. Also, based on continuous flow in the left brachial veins, suggests a proximal obstruction.  Assessment & Plan by Problem:   Active Problems:   Acute deep vein thrombosis (DVT) of left upper extremity (HCC)  Acute DVT of Left Upper Extremity Patient here with 3 day history of progressive left upper extremity swelling and mild erythema. US performed in ED positive for axillary and subclavian DVT despite compliance with daily Xarelto 20 mg. Pt does have ICD which was placed 1 year ago and there is some swelling and discoloration overlaying the ICD. Denies any hx of DVT or PE before and denied any hx of cancer other than melanoma which was treated successfully per  patient. Highly unusual for patient to develop thrombosis while anticoagulated with full-dose Xarelto however source is likely is ICD. Other considerations include thoracic outlet syndrome however patient does not have proximal muscle waisting or compromised arterial supply. IR consulted who recommend starting heparin and will see patient in AM.  -Heparin dosed per pharm -IR to see patient in AM -Echocardiogram ordered for evaluation of cardiac involvement -Telemetry  Combined Systolic and Diastolic CHF, Non-ischemic Most recent ECHO 02/2015 with EF of 25-30% prompting ICD placement. LHC 12/2014 without ischemia. Euvolemic and compliant with Coreg 25 mg BID. ICD checked 05/26/16. -Continue Coreg 25 mg BID -daily weights, I&O  Atrial Fibrillation Anticoagulated with Xarelto 20 mg daily and reports strict compliance with this medication. In atrial fibrillation on examination however without RVR. -Hold Xarelto -Heparin per pharm  Essential HTN Well controlled with Lisinopril 10 mg daily which has been  continued  Hypothyroidism Continue Armor thyroid 90 mg   IVF: NSL DVT Prophylaxis: Heparin Diet: HH Code Status: PARTIAL: No intubation, otherwise full intervention  Dispo: Admit patient to Observation with expected length of stay less than 2 midnights.  SignedNoemi Chapel: Ashni Lonzo, DO 06/07/2016, 4:06 PM  Pager: [phone]

## 2016-06-08 ENCOUNTER — Encounter (HOSPITAL_COMMUNITY): Payer: Self-pay | Admitting: *Deleted

## 2016-06-08 DIAGNOSIS — I82A12 Acute embolism and thrombosis of left axillary vein: Secondary | ICD-10-CM

## 2016-06-08 DIAGNOSIS — I5022 Chronic systolic (congestive) heart failure: Secondary | ICD-10-CM | POA: Diagnosis not present

## 2016-06-08 DIAGNOSIS — I82622 Acute embolism and thrombosis of deep veins of left upper extremity: Secondary | ICD-10-CM | POA: Diagnosis not present

## 2016-06-08 DIAGNOSIS — Z95810 Presence of automatic (implantable) cardiac defibrillator: Secondary | ICD-10-CM | POA: Diagnosis not present

## 2016-06-08 DIAGNOSIS — Z7901 Long term (current) use of anticoagulants: Secondary | ICD-10-CM | POA: Diagnosis not present

## 2016-06-08 DIAGNOSIS — Z809 Family history of malignant neoplasm, unspecified: Secondary | ICD-10-CM

## 2016-06-08 DIAGNOSIS — Z833 Family history of diabetes mellitus: Secondary | ICD-10-CM

## 2016-06-08 DIAGNOSIS — E039 Hypothyroidism, unspecified: Secondary | ICD-10-CM

## 2016-06-08 DIAGNOSIS — I82B12 Acute embolism and thrombosis of left subclavian vein: Secondary | ICD-10-CM | POA: Diagnosis not present

## 2016-06-08 DIAGNOSIS — I5042 Chronic combined systolic (congestive) and diastolic (congestive) heart failure: Secondary | ICD-10-CM | POA: Diagnosis present

## 2016-06-08 DIAGNOSIS — Z8249 Family history of ischemic heart disease and other diseases of the circulatory system: Secondary | ICD-10-CM

## 2016-06-08 DIAGNOSIS — I482 Chronic atrial fibrillation: Secondary | ICD-10-CM

## 2016-06-08 DIAGNOSIS — Z87891 Personal history of nicotine dependence: Secondary | ICD-10-CM

## 2016-06-08 DIAGNOSIS — I110 Hypertensive heart disease with heart failure: Secondary | ICD-10-CM | POA: Diagnosis not present

## 2016-06-08 DIAGNOSIS — M7989 Other specified soft tissue disorders: Secondary | ICD-10-CM | POA: Diagnosis not present

## 2016-06-08 DIAGNOSIS — I4891 Unspecified atrial fibrillation: Secondary | ICD-10-CM | POA: Diagnosis not present

## 2016-06-08 DIAGNOSIS — Z79899 Other long term (current) drug therapy: Secondary | ICD-10-CM

## 2016-06-08 LAB — APTT
APTT: 123 s — AB (ref 24–36)
aPTT: 117 seconds — ABNORMAL HIGH (ref 24–36)
aPTT: 157 seconds — ABNORMAL HIGH (ref 24–36)
aPTT: 99 seconds — ABNORMAL HIGH (ref 24–36)

## 2016-06-08 LAB — CBC
HCT: 37.6 % — ABNORMAL LOW (ref 39.0–52.0)
HEMOGLOBIN: 12.9 g/dL — AB (ref 13.0–17.0)
MCH: 32.3 pg (ref 26.0–34.0)
MCHC: 34.3 g/dL (ref 30.0–36.0)
MCV: 94 fL (ref 78.0–100.0)
Platelets: 140 10*3/uL — ABNORMAL LOW (ref 150–400)
RBC: 4 MIL/uL — ABNORMAL LOW (ref 4.22–5.81)
RDW: 13.6 % (ref 11.5–15.5)
WBC: 5.1 10*3/uL (ref 4.0–10.5)

## 2016-06-08 LAB — HEPARIN LEVEL (UNFRACTIONATED)
HEPARIN UNFRACTIONATED: 0.6 [IU]/mL (ref 0.30–0.70)
Heparin Unfractionated: 0.68 IU/mL (ref 0.30–0.70)
Heparin Unfractionated: 0.59 IU/mL (ref 0.30–0.70)

## 2016-06-08 MED ORDER — WARFARIN SODIUM 7.5 MG PO TABS
7.5000 mg | ORAL_TABLET | Freq: Once | ORAL | Status: AC
  Administered 2016-06-08: 7.5 mg via ORAL
  Filled 2016-06-08: qty 1

## 2016-06-08 MED ORDER — WARFARIN - PHARMACIST DOSING INPATIENT
Freq: Every day | Status: DC
  Administered 2016-06-08: 18:00:00

## 2016-06-08 MED ORDER — WARFARIN VIDEO
Freq: Once | Status: AC
  Administered 2016-06-08: 18:00:00

## 2016-06-08 MED ORDER — PATIENT'S GUIDE TO USING COUMADIN BOOK
Freq: Once | Status: AC
  Administered 2016-06-08: 18:00:00
  Filled 2016-06-08: qty 1

## 2016-06-08 NOTE — Progress Notes (Addendum)
I spoke with patient and wife at length concerning anticoagulation therapy.  Patient/wife state that patient started Xarelto sometime in June, 2017.  Patient had about six month supply of savaysa and used up this medication before beginning xarelto. Could not find documentation in chart relating to this medication.  Thomas Hoff, RN

## 2016-06-08 NOTE — H&P (Signed)
Internal Medicine Attending Admission Note  I saw and evaluated the patient. I reviewed the resident's note and I agree with the resident's findings and plan as documented in the resident's note.  Assessment & Plan by Problem:   Acute deep vein thrombosis (DVT) of left upper extremity (HCC)  - This is likely provoked by ICD however it is odd that this formed while on xarelto therapy.  Heparin drip appears to have helped.  IR has been consulted but given imporvement would not intervene. - I suspect we would consider this a Xarelto failure and need to transition to warfarin.  However Mr herrera and his wife ask excellent questions about how great the need is for him to keep the ICD in place for primary prevention.  We will ask cardiology to discuss with patient in further detail about the need for the ICD.  Chronic systolic CHF with ICD (implantable cardioverter-defibrillator) in place - Euvolemic on exam, will monitor volume status  Afib -Currently on heparin drip, will need to decide on A/C we may transtion to warfarin with lovenox bridge.   Chief Complaint(s): left arm swelling  History - key components related to admission: Briefly Marc Potter is a 70 year old male with history of systolic congestive heart failure with ICD in place, hypertension, chronic A. fib with anticoagulation of his relative who presented with swelling of his left arm. He reports that the swelling started about 3 days ago in his hands and progressed upwards to his shoulder. This was associated with some swelling as well as warmth and redness however he did not have any fevers or chills. Additionally he he noted some mild numbness in his hand. He denies any history of blood clots. He has not had any weight loss or other new symptoms. In the ED a ultrasound Doppler was performed on his upper extremity which revealed an acute DVT of the left subclavian and axillary veins and IMTS was consulted for admission. He does  take Xarelto for A. Fib, he has not missed any doses of this recently he does take this with the largest meal of the day.he does have a ICD in place which was placed about one year ago he is not having issues with that since placement. He was initially started on heparin. This morning his swelling has already gone down he reports he feels great and otherwise is pleased.   Lab results: Reviewed in Epic  Physical Exam - key components related to admission: General: moving about room, no distress, pleasant  HEENT:  EOMI, no scleral icterus Cardiac: irr irr, no rubs, murmurs or gallops Pulm: clear to auscultation bilaterally, moving normal volumes of air Abd: soft, nontender, nondistended, BS present Ext: left arm: minimal swelling, no rubor, pulses intact Neuro: alert and oriented X3  Vitals:   06/07/16 1613 06/07/16 1632 06/07/16 2053 06/08/16 0602  BP: 139/84 104/74 117/72 115/71  Pulse: 90 95 76 79  Resp: 20  18 18   Temp:  97.9 F (36.6 C) 97.7 F (36.5 C) 97.7 F (36.5 C)  TempSrc:  Oral Oral Oral  SpO2: 98% 99% 98% 99%  Weight:  199 lb 8.3 oz (90.5 kg)  192 lb 6.4 oz (87.3 kg)  Height:  5\' 9"  (1.753 m)

## 2016-06-08 NOTE — Progress Notes (Signed)
ANTICOAGULATION CONSULT NOTE Pharmacy Consult for Heparin  Indication: VTE Treatment  No Known Allergies  Patient Measurements: Height: 5\' 9"  (175.3 cm) Weight: 199 lb 8.3 oz (90.5 kg) IBW/kg (Calculated) : 70.7 Heparin Dosing Weight: 86 kg  Vital Signs: Temp: 97.7 F (36.5 C) (12/24 2053) Temp Source: Oral (12/24 2053) BP: 117/72 (12/24 2053) Pulse Rate: 76 (12/24 2053)  Labs:  Recent Labs  06/07/16 1258 06/08/16 0001  HGB 13.3  --   HCT 38.9*  --   PLT 153  --   APTT 32 99*  LABPROT 15.5*  --   INR 1.22  --   HEPARINUNFRC 0.70  --   CREATININE 0.89  --     Estimated Creatinine Clearance: 85.9 mL/min (by C-G formula based on SCr of 0.89 mg/dL).  Assessment: 70 y.o. male with LUE DVT, h/o Afib and Xarelto on hold, for heparin  Goal of Therapy:  APTT 66-102 Heparin level 0.3-0.7 units/ml Monitor platelets by anticoagulation protocol: Yes   Plan:  Continue Heparin at current rate  Follow-up am labs.   Geannie Risen, PharmD, BCPS  06/08/2016

## 2016-06-08 NOTE — Consult Note (Signed)
Chief Complaint: Patient was seen in consultation today for  Chief Complaint  Patient presents with  . left arm swelling   [facility]  Referring Physician(s): Molt, Toma CopierBethany, DO   Patient Status: [facility] - [facility]  History of Present Illness: Marc Potter is a 70 y.o. male with history of systolic CHF and atrial fibrillation.  Patient has been anticoagulated with Xarelto and compliant with his medications.  He recently noticed new pain in left wrist and hand, near base of thumb and thought it was related to arthritis.  Left upper extremity was markedly swollen yesterday.  He feels like the ICD has been moving a little in his chest recently.  Venous duplex demonstrated acute DVT in left subclavian vein and left axillary vein.  Patient was started on IV heparin overnight and feels like the swelling has markedly improved already.  The pain in the left wrist is still present but improved.  No other complaints.   Past Medical History:  Diagnosis Date  . 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 Atrial fibrillation (HCC)   . Heart failure with reduced ejection fraction, NYHA class II (HCC)    a. EF 25-30% by echo in 02/2015  . Heart palpitations   . Hyperlipidemia   . Hypertension   . Left ventricular dysfunction   . Normal coronary arteries    by outpatient diagnostic coronary arteriography performed by myself 12/24/14.    Past Surgical History:  Procedure Laterality Date  . CARDIAC CATHETERIZATION N/A 12/24/2014   Procedure: Left Heart Cath and Coronary Angiography;  Surgeon: Runell GessJonathan J Berry, MD;  [facility];  Service: Cardiovascular;  Laterality: N/A;  . DOPPLER ECHOCARDIOGRAPHY  2012  . EP IMPLANTABLE DEVICE  06/04/2015  . EP IMPLANTABLE DEVICE N/A 06/04/2015   Procedure: ICD Implant;  Surgeon: Thurmon FairMihai Croitoru, MD;  Location: [facility] INVASIVE CV LAB;  Service: Cardiovascular;  Laterality: N/A;  . NM MYOVIEW  LTD  2012    Allergies: Patient has no known allergies.  Medications: Prior to Admission medications   Medication Sig Start Date End Date Taking? Authorizing Provider  carvedilol (COREG) 25 MG tablet Take 1 tablet (25 mg total) by mouth 2 (two) times daily. 04/13/16  Yes Mihai Croitoru, MD  Coenzyme Q10 (CO Q-10) 100 MG CAPS Take 1 tablet by mouth daily.   Yes Historical Provider, MD  glucosamine-chondroitin 500-400 MG tablet Take 1 tablet by mouth 2 (two) times daily.   Yes Historical Provider, MD  lisinopril (PRINIVIL,ZESTRIL) 10 MG tablet Take 1 tablet (10 mg total) by mouth daily. 05/11/16  Yes Runell GessJonathan J Berry, MD  Multiple Vitamin (MULTIVITAMIN) tablet Take 1 tablet by mouth daily.   Yes Historical Provider, MD  multivitamin-lutein (OCUVITE-LUTEIN) CAPS capsule Take 1 capsule by mouth daily.   Yes Historical Provider, MD  rivaroxaban (XARELTO) 20 MG TABS tablet Take 1 tablet (20 mg total) by mouth daily with supper. 09/18/15  Yes Mihai Croitoru, MD  thyroid (ARMOUR) 90 MG tablet Take 90 mg by mouth daily.   Yes Historical Provider, MD  carvedilol (COREG) 25 MG tablet TAKE ONE TABLET BY MOUTH TWICE DAILY Patient not taking: Reported on 06/07/2016 04/13/16   Runell GessJonathan J Berry, MD     Family History  Problem Relation Age of Onset  . Diabetes Mother   . Hypertension Mother   . Cancer Father   . Heart failure Father     Social History   Social History  .  Marital status: Married    Spouse name: N/A  . Number of children: N/A  . Years of education: N/A   Social History Main Topics  . Smoking status: Former Smoker    Quit date: 06/15/1993  . Smokeless tobacco: Never Used  . Alcohol use Yes     Comment: DRINKS ON OCCASION   . Drug use: No  . Sexual activity: Not Asked   Other Topics Concern  . None   Social History Narrative  . None    Review of Systems  Musculoskeletal:       Left arm swelling Left wrist and hand pain.  Neurological: Negative for weakness.     Vital Signs: BP 115/71 (BP Location: Left Arm)   Pulse 79   Temp 97.7 F (36.5 C) (Oral)   Resp 18   Ht 5\' 9"  (1.753 m)   Wt 192 lb 6.4 oz (87.3 kg)   SpO2 99%   BMI 28.41 kg/m   Physical Exam  Constitutional: He appears well-developed. No distress.  Musculoskeletal: He exhibits edema.  Mild discoloration in left hand compared to right side. Mild swelling and left forearm, hand and upper arm.  The arm is not tense. Palpable wrist pulses bilaterally.  Symmetric strength in both hands. Normal capillary refill in left fingers  Normal appearance of left chest and ICD site.  No redness or swelling.  No swelling in the lower neck.  No evidence for venous engorgement.          Imaging: Dg Forearm Left  Result Date: 06/07/2016 CLINICAL DATA:  Left forearm and hand pain and swelling. No known injury. Initial encounter. EXAM: LEFT FOREARM - 2 VIEW COMPARISON:  None. FINDINGS: No acute bony or joint abnormality is identified. Bones are somewhat osteopenic. Soft tissues are unremarkable. IMPRESSION: No acute abnormality. Electronically Signed   By: Drusilla Kanner M.D.   On: 06/07/2016 13:27   Dg Hand Complete Left  Result Date: 06/07/2016 CLINICAL DATA:  Left forearm and hand pain and swelling. No known injury. Initial encounter. EXAM: LEFT HAND - COMPLETE 3+ VIEW COMPARISON:  None. FINDINGS: No acute bony or joint abnormality is identified. Bones are somewhat osteopenic. 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 Soft tissues are unremarkable. IMPRESSION: No acute abnormality. Electronically Signed   By: Drusilla Kanner M.D.   On: 06/07/2016 13:27    Labs:  CBC:  Recent Labs  06/07/16 1258 06/08/16 0305  WBC 5.1 5.1  HGB 13.3 12.9*  HCT 38.9* 37.6*  PLT 153 140*    COAGS:  Recent Labs  06/07/16 1258 06/08/16 0001 06/08/16 0305  INR 1.22  --   --   APTT 32 99* 123*    BMP:  Recent Labs  06/07/16 1258  NA 139  K 4.4  CL 111  CO2 26  GLUCOSE 113*  BUN 14   CALCIUM 9.0  CREATININE 0.89  GFRNONAA >60  GFRAA >60    LIVER FUNCTION TESTS:  Recent Labs  06/07/16 1258  BILITOT 0.8  AST 29  ALT 25  ALKPHOS 48  PROT 5.7*  ALBUMIN 3.7    TUMOR MARKERS: No results for input(s): AFPTM, CEA, CA199, CHROMGRNA in the last 8760 hours.  Assessment and Plan:  70 yo male with acute DVT in left axillary vein and left subclavian vein based on venous duplex.  Developed DVT despite being on Xarelto.  DVT may be related to underlying stenosis related to the left chest ICD.  Discussed treatment options for the DVT with  patient and wife.  Discussed anticoagulation vs. catheter-directed thrombolysis/thrombectomy.  At this point, patient is improving with IV heparin and no signs of arterial compromise or compartment syndrome.  Recommend continuing IV heparin and considering discharge with lovenox since DVT developed on Xarelto.  Consider getting Cardiology's input about the ICD since the DVT is likely related to the implantable device.  IR will continue to follow while inpatient.  If symptoms do not continue to improve or if Cardiology has other thoughts, we could consider a diagnostic venogram and catheter-directed therapy.     Thank you for this interesting consult.  I greatly enjoyed meeting Ballard Budney and look forward to participating in their care.  A copy of this report was sent to the requesting provider on this date.  Electronically Signed: Abundio Miu 06/08/2016, 9:45 AM   I spent a total of 20 Minutes    in face to face in cliical consultation, greater than 50% of which was counseling/coordinating care for left upper extremity DVT.

## 2016-06-08 NOTE — Progress Notes (Signed)
   Subjective: [name] was seen and evaluated today at bedside, wife was present during examination. Patient reports he is felling well and that his arm swelling has decreased significantly since admission. He denies any symptoms of SOB, chest pain, leg swelling or any other symptoms. He is in good spirits and agreeable to staying in the hospital.   Objective:  Vital signs in last 24 hours: Vitals:   06/07/16 1613 06/07/16 1632 06/07/16 2053 06/08/16 0602  BP: 139/84 104/74 117/72 115/71  Pulse: 90 95 76 79  Resp: 20  18 18   Temp:  97.9 F (36.6 C) 97.7 F (36.5 C) 97.7 F (36.5 C)  TempSrc:  Oral Oral Oral  SpO2: 98% 99% 98% 99%  Weight:  199 lb 8.3 oz (90.5 kg)  192 lb 6.4 oz (87.3 kg)  Height:  5\' 9"  (1.753 m)     General: healthy appearing older caucasian male resting comfortably in bed. In no acute distress. Wife present at bedside. Open and agreeable to discussion of plan. HENT: EOMI. No conjunctival injection, icterus or ptosis.  Cardiovascular: Irregularly irregular. No murmur or rub appreciated. Pulmonary: CTA BL, no wheezing, crackles or rhonchi appreciated. Unlabored breathing.  Extremities: Significant improvement of L UE swelling and erythema. Mild discoloration of left hand. Intact distal pulses. No gross deformities. Skin: Warm, dry.   Neuro: Strength and sensation grossly intact. Sensation improved from yesterday. Psych: Very pleasant and agreeable. Mood normal and affect was mood congruent. Responds to questions appropriately.   Assessment/Plan:  Active Problems:   Left ventricular dysfunction   ICD (implantable cardioverter-defibrillator) in place   Acute deep vein thrombosis (DVT) of left upper extremity (HCC)  Acute DVT of  Left UE, Axillary and Subclavian Veins Pt reported compliance with Xarelto 20 mg a day and was taking the medication correctly (with his largest meal of the day, dinner). Seen by vascular IR who recommend continuing Heparin as  pt has had good response already to this treatment. Will defer catheter-directed thrombolysis/thrombectomy due to pts clinical improvement. At this point, we will need to change his anticoagulation regimen from Xarelto to another agent and this was discussed at length with pt and wife. They are both agreeable and open to switching and are agreeable to starting Coumadin. Will start coumadin today while patient on Heparin. INR goal 2.5-3.5. Expect this will take several days to achieve.  -Continue Heparin, dosed per pharmacy -Bridging to Coumadin - also dosed per pharmacy. INR goal 2.5-3.5.  -Still obtaining echo to search for cardiac involvement  Atrial Fibrillation Currently anticoagulated with Heparin. Pt agreeable to starting another agent, namely Warfarin. Nursing reports short run of RVR however pt was asymptomatic. -Management as above  Essential Hypertension Still controlled. Continuing Lisinopril  Dispo: Anticipated discharge in approximately 1 day(s).   Conrad Zajkowski, DO 06/08/2016, 10:22 AM Pager: [phone]

## 2016-06-08 NOTE — Progress Notes (Signed)
ANTICOAGULATION CONSULT NOTE - Follow Up Consult  Pharmacy Consult for Heparin and Coumadin Indication: DVT LUE  Assessment:  70 year old male who presented 12/24 with left arm swelling. [place] of left upper extremity showed possibe acute DVT in left subclavian and axillary veins. Pharmacy was consulted for heparin dosing.  Patient was on Xarelto 20 mg daily prior to admission for A fib. Last dose was reported to be 12/23 at around 1800 per patient.  Recent Xarelto doses will effect heparin levels, so was using aPTTs to dose adjust heparin. However, it has been 48 hours since patient's last Xarelto dose, therefore the heparin level is correlating to the aPTT, and will use the heparin level for further dose adjustments.   Hemoglobin and platelets wnl. No bleeding reported. aPTT is 117 (slightly above goal of 66-102), heparin level is 0.6 (therapeutic). See previous note for today's warfarin dosing.   Goal of Therapy:  Heparin level 0.3-0.7 units/ml  INR 2.5-3.5 Monitor platelets by anticoagulation protocol: Yes   Plan:  Continue heparin drip at 1350 units/hr Daily heparin level and CBC while on heparin Will need overlap of Coumadin and Heparin or Lovenox for at least 5 days, or til INR >2  Allie Bossier, PharmD PGY1 Pharmacy Resident [phone] (Pager) 06/08/2016 10:08 PM

## 2016-06-08 NOTE — Progress Notes (Addendum)
ANTICOAGULATION CONSULT NOTE - Follow Up Consult  Pharmacy Consult for Heparin and Coumadin Indication: DVT LUE  No Known Allergies  Patient Measurements: Height: 5\' 9"  (175.3 cm) Weight: 192 lb 6.4 oz (87.3 kg) IBW/kg (Calculated) : 70.7 Heparin Dosing Weight: 87 kg  Vital Signs: Temp: 97.7 F (36.5 C) (12/25 0602) Temp Source: Oral (12/25 0602) BP: 115/71 (12/25 0602) Pulse Rate: 79 (12/25 0602)  Labs:  Recent Labs  06/07/16 1258 06/08/16 0001 06/08/16 0305  HGB 13.3  --  12.9*  HCT 38.9*  --  37.6*  PLT 153  --  140*  APTT 32 99* 123*  LABPROT 15.5*  --   --   INR 1.22  --   --   HEPARINUNFRC 0.70  --  0.68  CREATININE 0.89  --   --     Estimated Creatinine Clearance: 84.4 mL/min (by C-G formula based on SCr of 0.89 mg/dL).  Assessment:  70 year old male who presented 12/24 with left arm swelling. [place] of left upper extremity showed possibe acute DVT in left subclavian and axillary veins. Pharmacy was consulted for heparin dosing.  Patient was on Xarelto 20 mg daily prior to admission for A fib. Last dose was reported to be 12/23 at around 1800 per patient.  Recent Xarelto doses will effect heparin levels, so using aPTTs and heparin levels for now.  Intial aPTT was therapeutic (99 seconds) on heparin at 1500 units/hr.  Next aPTT up to 123 seconds, above goal. Only drawn ~3 hrs after initial aPTT.  Heparin level 0.68, though expected higher level, with baseline heparin level as 0.70 prior to starting heparin. Platelet count 153>140K.  No bleeding reported.  Patient reports that left arm swelling has improved.   Goal of Therapy:  Heparin level 0.3-0.7 units/ml aPTT 66-102 seconds  INR 2.5-3.5 Monitor platelets by anticoagulation protocol: Yes   Plan:   Continue heparin drip at 1500 units/hr.  Repeat heparin level and aPTT ~12noon   Daily heparin level, aPTT and CBC while on heparin.  Dennie Fetters, [place] Pager: [phone] 06/08/2016,9:49 AM    Addendum:  Heparin level this afternoon on 1500 units/hr is 0.59, but aPTT up to 157 seconds. Expected higher heparin level with recent Xarelto doses.   To begin Coumadin for target INR 2.5-3.5    Plan:   Decrease heparin drip to 1350 units/hr.   Heparin level and aPTT ~ 6 hrs after rate change.   Coumadin 7.5 mg today.   Daily PT/INR.   Will need overlap of Coumadin and Heparin or Lovenox for at least 5 days, or til INR >2.   Discussed Coumadin monitoring and potential drug-drug and drug-food interactions with patient and wife.   Hilarie Fredrickson, RPh 4:06 PM 06/08/2016

## 2016-06-09 ENCOUNTER — Inpatient Hospital Stay (HOSPITAL_COMMUNITY): Payer: Medicare Other

## 2016-06-09 DIAGNOSIS — I4891 Unspecified atrial fibrillation: Secondary | ICD-10-CM

## 2016-06-09 LAB — ECHOCARDIOGRAM COMPLETE
HEIGHTINCHES: 69 in
WEIGHTICAEL: 3120 [oz_av]

## 2016-06-09 LAB — HEPARIN LEVEL (UNFRACTIONATED): Heparin Unfractionated: 0.7 IU/mL (ref 0.30–0.70)

## 2016-06-09 LAB — CBC
HCT: 41 % (ref 39.0–52.0)
Hemoglobin: 13.8 g/dL (ref 13.0–17.0)
MCH: 31.4 pg (ref 26.0–34.0)
MCHC: 33.7 g/dL (ref 30.0–36.0)
MCV: 93.4 fL (ref 78.0–100.0)
PLATELETS: 160 10*3/uL (ref 150–400)
RBC: 4.39 MIL/uL (ref 4.22–5.81)
RDW: 13.4 % (ref 11.5–15.5)
WBC: 6.5 10*3/uL (ref 4.0–10.5)

## 2016-06-09 LAB — PROTIME-INR
INR: 1.01
PROTHROMBIN TIME: 13.4 s (ref 11.4–15.2)

## 2016-06-09 MED ORDER — ENOXAPARIN SODIUM 100 MG/ML ~~LOC~~ SOLN: 100.0000 mg | Freq: Two times a day (BID) | SUBCUTANEOUS | 0 refills | Status: DC

## 2016-06-09 MED ORDER — ENOXAPARIN (LOVENOX) PATIENT EDUCATION KIT: 1.0000 | PACK | Freq: Once | 0 refills | Status: AC

## 2016-06-09 MED ORDER — ENOXAPARIN SODIUM 150 MG/ML ~~LOC~~ SOLN: 1.5000 mg/kg | SUBCUTANEOUS | 0 refills | Status: DC

## 2016-06-09 MED ORDER — WARFARIN SODIUM 5 MG PO TABS: 5.0000 mg | ORAL_TABLET | Freq: Every day | ORAL | 0 refills | Status: DC

## 2016-06-09 MED ORDER — ENOXAPARIN (LOVENOX) PATIENT EDUCATION KIT
PACK | Freq: Once | Status: DC
  Filled 2016-06-09: qty 1

## 2016-06-09 MED ORDER — ENOXAPARIN SODIUM 150 MG/ML ~~LOC~~ SOLN
1.5000 mg/kg | SUBCUTANEOUS | Status: DC
  Administered 2016-06-09: 135 mg via SUBCUTANEOUS
  Filled 2016-06-09: qty 0.89

## 2016-06-09 MED ORDER — WARFARIN SODIUM 7.5 MG PO TABS
7.5000 mg | ORAL_TABLET | Freq: Once | ORAL | Status: AC
  Administered 2016-06-09: 7.5 mg via ORAL
  Filled 2016-06-09: qty 1

## 2016-06-09 NOTE — Progress Notes (Signed)
  Echocardiogram 2D Echocardiogram has been performed.  Denyla Cortese L Androw 06/09/2016, 9:26 AM

## 2016-06-09 NOTE — Progress Notes (Signed)
Insurance check completed for Lovenox  S/W CHRIS @ [employer] [facility] # [phone]   1. LOVENOX 150 MG TOTAL OF 7 SYRINGES   COVER- YES  CO-PAY- $ 190.55  PRIOR APPROVAL - YES # [id number]   2. ENOXAPARIN 150 MG TOTAL 7 SYRINGES   COVER- YES  CO-PAY- $ 190.15  PRIOR APPROVAL- NO   PHARMACY : [facility]

## 2016-06-09 NOTE — Care Management Note (Signed)
Case Management Note Donn Pierini RN, BSN Unit 2W-Case Manager [phone]  Patient Details  Name: Marc Potter MRN: [id number] Date of Birth: Dec 29, 1945  Subjective/Objective:   Pt admitted with DVT                Action/Plan: PTA pt lived at home with wife- plan to d/c home with lovenox- insurance check completed- copay $190.15- spoke with pt and wife at bedside- copay cost shared- states they can afford cost- awaiting d/c lovenox teaching.   Expected Discharge Date:    06/09/16              Expected Discharge Plan:  Home/Self Care  In-House Referral:     Discharge planning Services  CM Consult, Medication Assistance  Post Acute Care Choice:    Choice offered to:     DME Arranged:    DME Agency:     HH Arranged:    HH Agency:     Status of Service:  Completed, signed off  If discussed at [employer] of Stay Meetings, dates discussed:    Additional Comments:  Darrold Span, RN 06/09/2016, 2:00 PM

## 2016-06-09 NOTE — Progress Notes (Signed)
ANTICOAGULATION CONSULT NOTE - Follow Up Consult  Pharmacy Consult for Heparin and Coumadin Indication: DVT LUE and atrial fibrillation  No Known Allergies  Patient Measurements: Height: 5\' 9"  (175.3 cm) Weight: 195 lb (88.5 kg) IBW/kg (Calculated) : 70.7 Heparin Dosing Weight: 87 kg  Vital Signs: Temp: 98.3 F (36.8 C) (12/26 0750) Temp Source: Oral (12/26 0750) BP: 124/68 (12/26 0750) Pulse Rate: 78 (12/26 0750)  Labs:  Recent Labs  06/07/16 1258  06/08/16 0305 06/08/16 1142 06/08/16 2124 06/09/16 0215  HGB 13.3  --  12.9*  --   --  13.8  HCT 38.9*  --  37.6*  --   --  41.0  PLT 153  --  140*  --   --  160  APTT 32  < > 123* 157* 117*  --   LABPROT 15.5*  --   --   --   --  13.4  INR 1.22  --   --   --   --  1.01  HEPARINUNFRC 0.70  --  0.68 0.59 0.60 0.70  CREATININE 0.89  --   --   --   --   --   < > = values in this interval not displayed.  Estimated Creatinine Clearance: 85 mL/min (by C-G formula based on SCr of 0.89 mg/dL).  Assessment:  70 year old male who presented 12/24 with left arm swelling. [place] of left upper extremity showed possibe acute DVT in left subclavian and axillary veins. Pharmacy consulted for heparin and Coumadin dosing.  Patient was on Xarelto 20 mg daily prior to admission for A fib. Last dose was reported to be 12/23 at around 1800 per patient.  Recent Xarelto doses can effect heparin levels, so had been using aPTTs and heparin levels for monitoring, but aPTTs stopped after 12/25 labs.  Heparin level is therapeutic (0.70) on 1350 units/hr. INR 1.01 after Coumadin 7.5 mg x 1 last night. Spoke with Dr. Vincente Liberty. To transition to Lovenox 1.5 mg/kg sq q24hrs -> 135 mg (= 0.9 ml of 150 mg/ml syringe). Patient to learn to give injections.   Goal of Therapy:  Heparin level 0.3-0.7 units/ml  INR 2.5-3.5 Monitor platelets by anticoagulation protocol: Yes   Plan:   Heparin drip to stop now, and Lovenox 135 mg (1.5 mg/kg) sq q24hrs to begin 1 hr  later. RN to instruct patient on self-administration.  Repeat Coumadin 7.5 mg x 1 today. Dose to be given prior to discharge today. To be discharged on Coumadin 5 mg daily, with plan for outpatient follow up on 06/12/16.  Dennie Fetters, RPh Pager: [phone] 06/09/2016,11:00 AM

## 2016-06-09 NOTE — Progress Notes (Signed)
Referring Physician(s): Toma Copier Molt  Supervising Physician: Gilmer Mor  Patient Status:  [facility] - In-pt  Chief Complaint:  Left arm DVT   Subjective:  Marc Potter is doing better today. He states the swelling in his arm is much better and it feels better today.  He is getting an ECHO during my visit.   Allergies: Patient has no known allergies.  Medications: Prior to Admission medications   Medication Sig Start Date End Date Taking? Authorizing Provider  carvedilol (COREG) 25 MG tablet Take 1 tablet (25 mg total) by mouth 2 (two) times daily. 04/13/16  Yes Mihai Croitoru, MD  Coenzyme Q10 (CO Q-10) 100 MG CAPS Take 1 tablet by mouth daily.   Yes Historical Provider, MD  glucosamine-chondroitin 500-400 MG tablet Take 1 tablet by mouth 2 (two) times daily.   Yes Historical Provider, MD  lisinopril (PRINIVIL,ZESTRIL) 10 MG tablet Take 1 tablet (10 mg total) by mouth daily. 05/11/16  Yes Runell Gess, MD  Multiple Vitamin (MULTIVITAMIN) tablet Take 1 tablet by mouth daily.   Yes Historical Provider, MD  multivitamin-lutein (OCUVITE-LUTEIN) CAPS capsule Take 1 capsule by mouth daily.   Yes Historical Provider, MD  rivaroxaban (XARELTO) 20 MG TABS tablet Take 1 tablet (20 mg total) by mouth daily with supper. 09/18/15  Yes Mihai Croitoru, MD  thyroid (ARMOUR) 90 MG tablet Take 90 mg by mouth daily.   Yes Historical Provider, MD  carvedilol (COREG) 25 MG tablet TAKE ONE TABLET BY MOUTH TWICE DAILY Patient not taking: Reported on 06/07/2016 04/13/16   Runell Gess, MD     Vital Signs: BP 124/68 (BP Location: Left Arm)   Pulse 78   Temp 98.3 F (36.8 C) (Oral)   Resp 20   Ht 5\' 9"  (1.753 m)   Wt 195 lb (88.5 kg)   SpO2 100%   BMI 28.80 kg/m   Physical Exam  Awake and alert NAD Left arm edema improving. Good ROM in shoulder, elbow, wrist.  Imaging: Dg Forearm Left  Result Date: 06/07/2016 CLINICAL DATA:  Left forearm and hand pain and swelling. No known  injury. Initial encounter. EXAM: LEFT FOREARM - 2 VIEW COMPARISON:  None. FINDINGS: No acute bony or joint abnormality is identified. Bones are somewhat osteopenic. Soft tissues are unremarkable. IMPRESSION: No acute abnormality. Electronically Signed   By: Drusilla Kanner M.D.   On: 06/07/2016 13:27   Dg Hand Complete Left  Result Date: 06/07/2016 CLINICAL DATA:  Left forearm and hand pain and swelling. No known injury. Initial encounter. EXAM: LEFT HAND - COMPLETE 3+ VIEW COMPARISON:  None. FINDINGS: No acute bony or joint abnormality is identified. Bones are somewhat osteopenic. 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 Soft tissues are unremarkable. IMPRESSION: No acute abnormality. Electronically Signed   By: Drusilla Kanner M.D.   On: 06/07/2016 13:27    Labs:  CBC:  Recent Labs  06/07/16 1258 06/08/16 0305 06/09/16 0215  WBC 5.1 5.1 6.5  HGB 13.3 12.9* 13.8  HCT 38.9* 37.6* 41.0  PLT 153 140* 160    COAGS:  Recent Labs  06/07/16 1258 06/08/16 0001 06/08/16 0305 06/08/16 1142 06/08/16 2124 06/09/16 0215  INR 1.22  --   --   --   --  1.01  APTT 32 99* 123* 157* 117*  --     BMP:  Recent Labs  06/07/16 1258  NA 139  K 4.4  CL 111  CO2 26  GLUCOSE 113*  BUN 14  CALCIUM 9.0  CREATININE 0.89  GFRNONAA >60  GFRAA >60    LIVER FUNCTION TESTS:  Recent Labs  06/07/16 1258  BILITOT 0.8  AST 29  ALT 25  ALKPHOS 48  PROT 5.7*  ALBUMIN 3.7    Assessment and Plan:  70 yo male with acute DVT in left axillary vein and left subclavian vein based on venous duplex.    He developed this DVT despite being on Xarelto.    This DVT may be related to underlying stenosis related to the left chest ICD.    Recommend discharge with lovenox since DVT developed on Xarelto.    Consider getting Cardiology's input about the ICD since the DVT is likely related to the implantable device  Electronically Signed: Gwynneth MacleodWENDY S BLAIR PA-C 06/09/2016, 10:16 AM   I spent a total  of 15 Minutes at the the patient's bedside AND on the patient's hospital floor or unit, greater than 50% of which was counseling/coordinating care for f/u left arm DVT.

## 2016-06-09 NOTE — Progress Notes (Signed)
Pt/family given discharge instructions, medication lists, follow up appointments, and when to call the doctor.  Pt/family verbalizes understanding. Patient and wife educated on administration of lovenox shots.  All questions answered. Thomas Hoff, RN

## 2016-06-09 NOTE — Progress Notes (Signed)
   Subjective: Marc Potter was seen and evaluated today at bedside. Wife was present at bedside and he was currently undergoing echocardiogram. The patient denied any acute complaints overnight reports overall he feels improved since admission. He continues to feel that his left upper extremity swelling is decreasing. Denies any chest pain or shortness of breath. Patient reports he is not excited about having to give himself shots for several days however is willing to comply with this bridging to Coumadin.  Objective:  Vital signs in last 24 hours: Vitals:   06/08/16 1225 06/08/16 2209 06/09/16 0451 06/09/16 0750  BP: 120/63 120/61 (!) 84/56 124/68  Pulse: 67 97 82 78  Resp: 19 18 18 20   Temp: 97.7 F (36.5 C) 98.1 F (36.7 C) 98 F (36.7 C) 98.3 F (36.8 C)  TempSrc: Oral Oral Oral Oral  SpO2: 99% 99% 99% 100%  Weight:   195 lb (88.5 kg)   Height:       General: Very pleasant older Caucasian male resting comfortably in bed. Echo being performed. Wife at bedside. In no acute distress HENT: EOMI. No conjunctival injection, icterus or ptosis. Cardiovascular: Irregularly irregular, rate controlled. No murmur or rub appreciated. Pulmonary: CTA BL, no wheezing, crackles or rhonchi appreciated. Unlabored breathing.  Abdomen: Soft, non-tender and non-distended. Extremities: Left upper extremity with trace edema, erythema has resolved. Intact distal pulses. No gross deformities. Skin: Warm, dry, well perfused. No cyanosis. Neuro: Strength and sensation grossly intact.  Psych: Mood normal and affect was mood congruent. Responds to questions appropriately.   Assessment/Plan:  Active Problems:   Left ventricular dysfunction   ICD (implantable cardioverter-defibrillator) in place   Acute deep vein thrombosis (DVT) of left upper extremity (HCC)  Acute DVT of left upper extremity, axillary vein and subclavian vein Despite anticoagulation with 20 mg of Xarelto daily, likely aggravating  source ICD wires. Case was discussed with interventional radiology and cardiology yesterday who recommend keeping ICD in place and changing anticoagulation to warfarin. Patient is agreeable to transitioning to warfarin at this time and was counseled on the intricacies of taking this medication including but not limited to bridging with heparin, frequent INR testing and dietary regulation. Patient will require close follow-up with his primary care physician and would prefer that the patient see his primary care physician by the end the week if not by early next week. Have called his primary care office which appears to be closed for the holidays, could not schedule follow-up appointment at this time. -Continue bridging with heparin while awaiting Coumadin to become therapeutic. INR less than 2 today. -We'll discharge patient home with subcutaneous Lovenox daily until April 2 see PCP by the end dose this week or early next week. Will continue Coumadin 5 mg daily during this time  Atrial fibrillation Currently anticoagulated with heparin and Coumadin which is not yet therapeutic.  Dispo: Anticipated discharge today.  Adaria Hole, DO 06/09/2016, 10:37 AM Pager: [phone]

## 2016-06-09 NOTE — Discharge Instructions (Addendum)
Please administer subcutaneous Lovenox injection daily and take Coumadin 5 mg once daily until you're able to have your INR evaluated. Overlapping these medications insures that you are anticoagulated until Coumadin reaches a therapeutic level. Please try to schedule an appointment with your primary care physician OR cardiologist by the end of the week or early next week for an INR check. If you are to develop any significant bleeding please report to the emergency department immediately.  Please see information below regarding Warfarin.  ----------------------------------------------------------------------------------------------------------------------------------------------------------------------------------  Information on my medicine - Coumadin   (Warfarin)  This medication education was reviewed with me or my healthcare representative as part of my discharge preparation.  The pharmacist that spoke with me during my hospital stay was:  Dennie Fettersgan, Theresa Donovan, [facility]PH  Why was Coumadin prescribed for you? Coumadin was prescribed for you because you have a blood clot or a medical condition that can cause an increased risk of forming blood clots. Blood clots can cause serious health problems by blocking the flow of blood to the heart, lung, or brain. Coumadin can prevent harmful blood clots from forming. As a reminder your indication for Coumadin is:   Stroke Prevention Because Of Atrial Fibrillation and blood clot in left arm. What test will check on my response to Coumadin? While on Coumadin (warfarin) you will need to have an INR test regularly to ensure that your dose is keeping you in the desired range. The INR (international normalized ratio) number is calculated from the result of the laboratory test called prothrombin time (PT).  If an INR APPOINTMENT HAS NOT ALREADY BEEN MADE FOR YOU please schedule an appointment to have this lab work done by your health care provider within 7 days. Your  INR goal is usually a number between:  2 to 3 or your provider may give you a more narrow range like 2-2.5.  Ask your health care provider during an office visit what your goal INR is.  Current goal is INR 2.5-3.5.  What  do you need to  know  About  COUMADIN? Take Coumadin (warfarin) exactly as prescribed by your healthcare provider about the same time each day.  DO NOT stop taking without talking to the doctor who prescribed the medication.  Stopping without other blood clot prevention medication to take the place of Coumadin may increase your risk of developing a new clot or stroke.  Get refills before you run out.  What do you do if you miss a dose? If you miss a dose, take it as soon as you remember on the same day then continue your regularly scheduled regimen the next day.  Do not take two doses of Coumadin at the same time.  Important Safety Information A possible side effect of Coumadin (Warfarin) is an increased risk of bleeding. You should call your healthcare provider right away if you experience any of the following: ? Bleeding from an injury or your nose that does not stop. ? Unusual colored urine (red or dark brown) or unusual colored stools (red or black). ? Unusual bruising for unknown reasons. ? A serious fall or if you hit your head (even if there is no bleeding).  Some foods or medicines interact with Coumadin (warfarin) and might alter your response to warfarin. To help avoid this: ? Eat a balanced diet, maintaining a consistent amount of Vitamin K. ? Notify your provider about major diet changes you plan to make. ? Avoid alcohol or limit your intake to 1 drink for women  and 2 drinks for men per day. (1 drink is 5 oz. wine, 12 oz. beer, or 1.5 oz. liquor.)  Make sure that ANY health care provider who prescribes medication for you knows that you are taking Coumadin (warfarin).  Also make sure the healthcare provider who is monitoring your Coumadin knows when you have started  a new medication including herbals and non-prescription products.  Coumadin (Warfarin)  Major Drug Interactions  Increased Warfarin Effect Decreased Warfarin Effect  Alcohol (large quantities) Antibiotics (esp. Septra/Bactrim, Flagyl, Cipro) Amiodarone (Cordarone) Aspirin (ASA) Cimetidine (Tagamet) Megestrol (Megace) NSAIDs (ibuprofen, naproxen, etc.) Piroxicam (Feldene) Propafenone (Rythmol SR) Propranolol (Inderal) Isoniazid (INH) Posaconazole (Noxafil) Barbiturates (Phenobarbital) Carbamazepine (Tegretol) Chlordiazepoxide (Librium) Cholestyramine (Questran) Griseofulvin Oral Contraceptives Rifampin Sucralfate (Carafate) Vitamin K   Coumadin (Warfarin) Major Herbal Interactions  Increased Warfarin Effect Decreased Warfarin Effect  Garlic Ginseng Ginkgo biloba Coenzyme Q10 Green tea St. Johns wort    Coumadin (Warfarin) FOOD Interactions  Eat a consistent number of servings per week of foods HIGH in Vitamin K (1 serving =  cup)  Collards (cooked, or boiled & drained) Kale (cooked, or boiled & drained) Mustard greens (cooked, or boiled & drained) Parsley *serving size only =  cup Spinach (cooked, or boiled & drained) Swiss chard (cooked, or boiled & drained) Turnip greens (cooked, or boiled & drained)  Eat a consistent number of servings per week of foods MEDIUM-HIGH in Vitamin K (1 serving = 1 cup)  Asparagus (cooked, or boiled & drained) Broccoli (cooked, boiled & drained, or raw & chopped) Brussel sprouts (cooked, or boiled & drained) *serving size only =  cup Lettuce, raw (green leaf, endive, romaine) Spinach, raw Turnip greens, raw & chopped   These websites have more information on Coumadin (warfarin):  http://www.king-russell.com/; https://www.hines.net/;

## 2016-06-09 NOTE — Progress Notes (Signed)
Internal Medicine Attending:   I saw and examined the patient. I reviewed the resident's note and I agree with the resident's findings and plan as documented in the resident's note. Patient continues to do well, his LUE swelling is nearly back to normal. He has no other complaints.  We discussed with him the reason for the ICD and the need to change his anticoagulation, he is in agreement.  We will contact Dr Hazle Coca office to see if he will be able to follow up there for coumadin monitoring.  Otherwise we can discharge him today with a Lovenox bridge.

## 2016-06-11 ENCOUNTER — Telehealth: Payer: Self-pay

## 2016-06-11 NOTE — Telephone Encounter (Signed)
Received call from patient.  He reported swelling in one arm and had ER visit on 12/24 and diagnosed with blood clots in his arm.  He is now on Lovenox x 6 days and warfarin.  He was told at the ER that the defib leads could have caused the clots.  He thinks the Xarelto did not prevent the clots.  He is feeling fine at this time.  Asked if any alerts showed on defibrillator and advised no alerts were sent.  No changes today.  Next ICM remote transmission 06/26/2016.

## 2016-06-12 ENCOUNTER — Ambulatory Visit (INDEPENDENT_AMBULATORY_CARE_PROVIDER_SITE_OTHER): Payer: Medicare Other | Admitting: Physician Assistant

## 2016-06-12 ENCOUNTER — Ambulatory Visit (INDEPENDENT_AMBULATORY_CARE_PROVIDER_SITE_OTHER): Payer: Medicare Other | Admitting: Pharmacist

## 2016-06-12 ENCOUNTER — Encounter: Payer: Self-pay | Admitting: Physician Assistant

## 2016-06-12 VITALS — BP 118/74 | HR 78 | Ht 72.0 in | Wt 192.0 lb

## 2016-06-12 DIAGNOSIS — I5022 Chronic systolic (congestive) heart failure: Secondary | ICD-10-CM

## 2016-06-12 DIAGNOSIS — Z7901 Long term (current) use of anticoagulants: Secondary | ICD-10-CM | POA: Diagnosis not present

## 2016-06-12 DIAGNOSIS — I482 Chronic atrial fibrillation, unspecified: Secondary | ICD-10-CM

## 2016-06-12 DIAGNOSIS — I82A12 Acute embolism and thrombosis of left axillary vein: Secondary | ICD-10-CM

## 2016-06-12 LAB — CUP PACEART REMOTE DEVICE CHECK
Battery Remaining Longevity: 126 mo
Battery Voltage: 3.02 V
Brady Statistic RV Percent Paced: 0.08 %
Date Time Interrogation Session: 20171212113526
HighPow Impedance: 68 Ohm
Implantable Lead Implant Date: 20161220
Implantable Lead Location: 753860
Implantable Pulse Generator Implant Date: 20161220
Lead Channel Impedance Value: 266 Ohm
Lead Channel Impedance Value: 323 Ohm
Lead Channel Pacing Threshold Amplitude: 0.625 V
Lead Channel Pacing Threshold Pulse Width: 0.4 ms
Lead Channel Sensing Intrinsic Amplitude: 11.5 mV
Lead Channel Sensing Intrinsic Amplitude: 11.5 mV
Lead Channel Setting Pacing Amplitude: 2.5 V
Lead Channel Setting Pacing Pulse Width: 0.4 ms
Lead Channel Setting Sensing Sensitivity: 0.3 mV

## 2016-06-12 LAB — POCT INR: INR: 1.4

## 2016-06-12 NOTE — Progress Notes (Signed)
Cardiology Office Note   Date:  06/12/2016   ID:  Marc Potter, DOB [birth year], MRN [id number]  PCP:  Nonnie DoneSLATOSKY,JOHN J., MD  Cardiologist: Dr Allyson SabalBerry, PPM: Dr Chrisandra Nettersroitoru  Eulalia Ellerman, PA-C   Chief Complaint  Patient presents with  . Hospitalization Follow-up    INR f/u    History of Present Illness: Marc Potter is a 70 y.o. male with a history of S-CHF, MDT single lead ICD, HTN, HLD, nl cors at cath 12/2014, chronic atrial fib on Xarelto  Admit 12/24-12/26 w/ acute LUE DVT, d/c on Lovenox>>coumadin  Marc Potter presents for post-hospital follow up.  He has done well since d/c. He feels the Xarelto was not working. He took ibuprofen a few times, pta, but does not routinely take NSAIDS. He had not missed any doses of Xarelto. He noticed that his bleeding time was normal, not like when he was on Savaysa. He had noticed his L arm was not as strong as it used to be for a while before the swelling started.  Since he has been on the Lovenox, no problems.   He has not had chest pain or SOB. He has not had new DOE, orthopnea or PND. He does not get LE edema.  He has no awareness of the atrial fib, does not get palpitations. No presyncope or syncope. He is active and exercises regularly. Has begun increasing his activity with no problems. L arm with some ecchymosis in areas of blood draws, but nothing in the area of the DVT. The swelling has improved.   Past Medical History:  Diagnosis Date  . AICD (automatic cardioverter/defibrillator) present 06/04/2015    Medtronic Evera XT VR model [id number] serial number [id number]  . Atrial fibrillation (HCC)   . Heart failure with reduced ejection fraction, NYHA class II (HCC)    a. EF 25-30% by echo in 02/2015  . Heart palpitations   . Hyperlipidemia   . Hypertension   . Left ventricular dysfunction   . Normal coronary arteries    by outpatient diagnostic coronary arteriography performed by Dr Allyson SabalBerry 12/24/14.    Past  Surgical History:  Procedure Laterality Date  . CARDIAC CATHETERIZATION N/A 12/24/2014   Procedure: Left Heart Cath and Coronary Angiography;  Surgeon: Runell GessJonathan J Berry, MD;  [facility];  Service: Cardiovascular;  Laterality: N/A;  . DOPPLER ECHOCARDIOGRAPHY  2012  . EP IMPLANTABLE DEVICE N/A 06/04/2015   Procedure: ICD Implant;  Surgeon: Thurmon FairMihai Croitoru, MD; Medtronic ReederEvera XT VR model [id number] serial number [id number]; Laterality: N/A;  . NM MYOVIEW LTD  2012    Current Outpatient Prescriptions  Medication Sig Dispense Refill  . carvedilol (COREG) 25 MG tablet Take 1 tablet (25 mg total) by mouth 2 (two) times daily. 180 tablet 3  . Coenzyme Q10 (CO Q-10) 100 MG CAPS Take 1 tablet by mouth daily.    [name]. enoxaparin (LOVENOX) 150 MG/ML injection Inject 0.89 mLs (135 mg total) into the skin daily. 8 Syringe 0  . glucosamine-chondroitin 500-400 MG tablet Take 1 tablet by mouth 2 (two) times daily.    [name]. lisinopril (PRINIVIL,ZESTRIL) 10 MG tablet Take 1 tablet (10 mg total) by mouth daily. 90 tablet 3  . Multiple Vitamin (MULTIVITAMIN) tablet Take 1 tablet by mouth daily.    . multivitamin-lutein (OCUVITE-LUTEIN) CAPS capsule Take 1 capsule by mouth daily.    [name]. thyroid (ARMOUR) 90 MG tablet Take 90 mg by mouth daily.    [name]. warfarin (COUMADIN) 5 MG tablet  Take 1 tablet (5 mg total) by mouth daily. 30 tablet 0   No current facility-administered medications for this visit.     Allergies:   Patient has no known allergies.    Social History:  The patient  reports that he quit smoking about 23 years ago. He has never used smokeless tobacco. He reports that he drinks alcohol. He reports that he does not use drugs.   Family History:  The patient's family history includes Cancer in his father; Diabetes in his mother; Heart failure in his father; Hypertension in his mother.    ROS:  Please see the history of present illness. All other systems are reviewed and negative.    PHYSICAL  EXAM: VS:  BP 118/74   Pulse 78   Ht 6' (1.829 m)   Wt 192 lb (87.1 kg)   BMI 26.04 kg/m  , BMI Body mass index is 26.04 kg/m. GEN: Well nourished, well developed, male in no acute distress  HEENT: normal for age  Neck: no JVD, no carotid bruit, no masses Cardiac: Irreg R&R; no murmur, no rubs, or gallops Respiratory:  clear to auscultation bilaterally, normal work of breathing GI: soft, nontender, nondistended, + BS MS: no deformity or atrophy; no edema; distal pulses are 2+ in all 4 extremities; resolving ecchymosis L antecubital area. Mildly enlarged L upper arm compared to the R   Skin: warm and dry, no rash Neuro:  Strength and sensation are intact Psych: euthymic mood, full affect   EKG:  EKG is ordered today. The ekg ordered today demonstrates Atrial fib, HR 78, diffuse T wave flattening   Recent Labs: 06/07/2016: ALT 25; BUN 14; Creatinine, Ser 0.89; Potassium 4.4; Sodium 139 06/09/2016: Hemoglobin 13.8; Platelets 160    Lipid Panel    Component Value Date/Time   CHOL 164 12/18/2014 0959   TRIG 209 (H) 12/18/2014 0959   HDL 43 12/18/2014 0959   CHOLHDL 3.8 12/18/2014 0959   VLDL 42 (H) 12/18/2014 0959   LDLCALC 79 12/18/2014 0959     Wt Readings from Last 3 Encounters:  06/12/16 192 lb (87.1 kg)  06/09/16 195 lb (88.5 kg)  11/01/15 189 lb 9.6 oz (86 kg)     Other studies Reviewed: Additional studies/ records that were reviewed today include: office notes, hospital records and testing.  ASSESSMENT AND PLAN:  1.  LUE DVT: L arm is improving, follow. Ok to increase activity gradually  2. Chronic systolic CHF: his weight is stable, respiratory status stable. Continue current meds  3. Chronic atrial fib: HR controlled, no sx  4. Chronic anticoagulation: he is tolerating the Lovenox>>coumadin well. He will be followed by the coumadin clinic here, Tresa Endo was in to see him today.   Current medicines are reviewed at length with the patient today.  The patient  does not have concerns regarding medicines.  The following changes have been made:  no change  Labs/ tests ordered today include:  No orders of the defined types were placed in this encounter.    Disposition:   FU with Dr Allyson Sabal  Signed, Leanna Battles  06/12/2016 9:23 AM    [facility] Phone: [phone]; Fax: [phone]  This note was written with the assistance of speech recognition software. Please excuse any transcriptional errors.

## 2016-06-12 NOTE — Patient Instructions (Addendum)
Medication Instructions:  NO CHANGES  If you need a refill on your cardiac medications before your next appointment, please call your pharmacy.   Follow-Up: KEEP SCHEDULED FOLLOW UP APPOINTMENTS: [facility] DEVICE CHECK 06-26-16 @940AM   DR Royann Shivers APPOINTMENT 07-22-2016 @945AM   Your physician wants you to follow-up in:  MAY 2018 WITH DR BERRY.  You will receive a reminder letter in the mail two months in advance. If you don't receive a letter, please call our office [place] [facility] to schedule the follow-up appointment.   Special Instructions:     Thank you for choosing [facility] at [facility], LPN

## 2016-06-16 ENCOUNTER — Ambulatory Visit (INDEPENDENT_AMBULATORY_CARE_PROVIDER_SITE_OTHER): Payer: Medicare Other | Admitting: Pharmacist

## 2016-06-16 DIAGNOSIS — I82A12 Acute embolism and thrombosis of left axillary vein: Secondary | ICD-10-CM

## 2016-06-16 LAB — POCT INR: INR: 1.7

## 2016-06-19 ENCOUNTER — Encounter: Payer: Self-pay | Admitting: Cardiology

## 2016-06-19 ENCOUNTER — Ambulatory Visit (INDEPENDENT_AMBULATORY_CARE_PROVIDER_SITE_OTHER): Payer: Medicare Other | Admitting: Pharmacist

## 2016-06-19 DIAGNOSIS — I82A12 Acute embolism and thrombosis of left axillary vein: Secondary | ICD-10-CM | POA: Diagnosis not present

## 2016-06-19 LAB — POCT INR: INR: 1.8

## 2016-06-19 MED ORDER — ENOXAPARIN SODIUM 150 MG/ML ~~LOC~~ SOLN: 1.5000 mg/kg | SUBCUTANEOUS | 0 refills | Status: DC

## 2016-06-22 ENCOUNTER — Ambulatory Visit (INDEPENDENT_AMBULATORY_CARE_PROVIDER_SITE_OTHER): Payer: Medicare Other | Admitting: Pharmacist Clinician (PhC)/ Clinical Pharmacy Specialist

## 2016-06-22 DIAGNOSIS — I82A12 Acute embolism and thrombosis of left axillary vein: Secondary | ICD-10-CM | POA: Diagnosis not present

## 2016-06-22 LAB — POCT INR: INR: 3

## 2016-06-23 ENCOUNTER — Other Ambulatory Visit: Payer: Self-pay | Admitting: Internal Medicine

## 2016-06-23 ENCOUNTER — Other Ambulatory Visit: Payer: Self-pay | Admitting: *Deleted

## 2016-06-23 MED ORDER — WARFARIN SODIUM 5 MG PO TABS: ORAL_TABLET | ORAL | 3 refills | Status: DC

## 2016-06-24 ENCOUNTER — Other Ambulatory Visit: Payer: Self-pay | Admitting: Internal Medicine

## 2016-06-24 NOTE — Telephone Encounter (Signed)
Called patient regarding coumadin that was sent to [facility] in [place]. Patient stated he has called [facility] to transfer med & asked for me to call them also to make sure it will be filled before he goes on a trip. Called [facility] & was told they are in process of transferring his coumadin.

## 2016-06-26 ENCOUNTER — Ambulatory Visit (INDEPENDENT_AMBULATORY_CARE_PROVIDER_SITE_OTHER): Payer: Medicare Other | Admitting: Pharmacist

## 2016-06-26 ENCOUNTER — Ambulatory Visit (INDEPENDENT_AMBULATORY_CARE_PROVIDER_SITE_OTHER): Payer: Medicare Other

## 2016-06-26 DIAGNOSIS — Z95810 Presence of automatic (implantable) cardiac defibrillator: Secondary | ICD-10-CM | POA: Diagnosis not present

## 2016-06-26 DIAGNOSIS — I5022 Chronic systolic (congestive) heart failure: Secondary | ICD-10-CM | POA: Diagnosis not present

## 2016-06-26 DIAGNOSIS — I82A12 Acute embolism and thrombosis of left axillary vein: Secondary | ICD-10-CM | POA: Diagnosis not present

## 2016-06-26 LAB — POCT INR: INR: 2.8

## 2016-06-26 NOTE — Progress Notes (Signed)
EPIC Encounter for ICM Monitoring  Patient Name: Marc Potter is a 71 y.o. male Date: 06/26/2016 Primary Care Physican: Nonnie Done., MD Primary Cardiologist:Berry Electrophysiologist: Croitoru Dry Weight:178 lbs       Heart Failure questions reviewed, pt asymptomatic   Thoracic impedance normal   Recommendations:  No changes. Discussed limiting dietary salt intake to 2000 mg/day and fluid intake to < 2 liters per day. Encouraged to call for fluid symptoms.  Follow-up plan: ICM clinic phone appointment on 08/25/2016 since he has an office appointment with Dr Royann Shivers on 07/22/2016 to check defibrillator.   Copy of ICM check sent to primary cardiologist and device physician.   3 month ICM trend: 06/26/2016   1 Year ICM trend:      Karie Soda, RN 06/26/2016 8:54 AM

## 2016-07-06 ENCOUNTER — Ambulatory Visit (INDEPENDENT_AMBULATORY_CARE_PROVIDER_SITE_OTHER): Payer: Medicare Other | Admitting: Pharmacist Clinician (PhC)/ Clinical Pharmacy Specialist

## 2016-07-06 DIAGNOSIS — I82A12 Acute embolism and thrombosis of left axillary vein: Secondary | ICD-10-CM | POA: Diagnosis not present

## 2016-07-06 LAB — POCT INR: INR: 4.7

## 2016-07-15 ENCOUNTER — Ambulatory Visit (INDEPENDENT_AMBULATORY_CARE_PROVIDER_SITE_OTHER): Payer: Medicare Other | Admitting: Pharmacist Clinician (PhC)/ Clinical Pharmacy Specialist

## 2016-07-15 DIAGNOSIS — I82A12 Acute embolism and thrombosis of left axillary vein: Secondary | ICD-10-CM

## 2016-07-15 LAB — POCT INR: INR: 2.5

## 2016-07-22 ENCOUNTER — Ambulatory Visit (INDEPENDENT_AMBULATORY_CARE_PROVIDER_SITE_OTHER): Payer: Medicare Other | Admitting: Pharmacist

## 2016-07-22 ENCOUNTER — Encounter: Payer: Self-pay | Admitting: Cardiovascular Disease

## 2016-07-22 ENCOUNTER — Ambulatory Visit (INDEPENDENT_AMBULATORY_CARE_PROVIDER_SITE_OTHER): Payer: Medicare Other | Admitting: Cardiovascular Disease

## 2016-07-22 VITALS — BP 90/60 | HR 68 | Ht 72.0 in | Wt 191.8 lb

## 2016-07-22 DIAGNOSIS — I82A12 Acute embolism and thrombosis of left axillary vein: Secondary | ICD-10-CM | POA: Diagnosis not present

## 2016-07-22 DIAGNOSIS — I5022 Chronic systolic (congestive) heart failure: Secondary | ICD-10-CM

## 2016-07-22 DIAGNOSIS — I482 Chronic atrial fibrillation, unspecified: Secondary | ICD-10-CM

## 2016-07-22 DIAGNOSIS — I428 Other cardiomyopathies: Secondary | ICD-10-CM

## 2016-07-22 DIAGNOSIS — Z9189 Other specified personal risk factors, not elsewhere classified: Secondary | ICD-10-CM

## 2016-07-22 DIAGNOSIS — Z95810 Presence of automatic (implantable) cardiac defibrillator: Secondary | ICD-10-CM

## 2016-07-22 LAB — CUP PACEART INCLINIC DEVICE CHECK
Battery Remaining Longevity: 125 mo
HighPow Impedance: 71 Ohm
Implantable Lead Implant Date: 20161220
Implantable Lead Location: 753860
Implantable Pulse Generator Implant Date: 20161220
Lead Channel Pacing Threshold Amplitude: 0.625 V
Lead Channel Pacing Threshold Pulse Width: 0.4 ms
Lead Channel Sensing Intrinsic Amplitude: 12.375 mV
Lead Channel Setting Pacing Pulse Width: 0.4 ms
Lead Channel Setting Sensing Sensitivity: 0.3 mV
MDC IDC MSMT BATTERY VOLTAGE: 3.01 V
MDC IDC MSMT LEADCHNL RV IMPEDANCE VALUE: 323 Ohm
MDC IDC MSMT LEADCHNL RV IMPEDANCE VALUE: 399 Ohm
MDC IDC MSMT LEADCHNL RV SENSING INTR AMPL: 12.125 mV
MDC IDC SESS DTM: 20180207125557
MDC IDC SET LEADCHNL RV PACING AMPLITUDE: 2.5 V
MDC IDC STAT BRADY RV PERCENT PACED: 0.12 %

## 2016-07-22 LAB — PACEMAKER DEVICE OBSERVATION

## 2016-07-22 LAB — POCT INR: INR: 2.2

## 2016-07-22 NOTE — Patient Instructions (Signed)
Dr Royann Shivers recommends that you continue on your current medications as directed. Please refer to the Current Medication list given to you today.  Remote monitoring is used to monitor your Pacemaker or ICD from home. This monitoring reduces the number of office visits required to check your device to one time per year. It allows [place] to keep an eye on the functioning of your device to ensure it is working properly. You are scheduled for a device check from home on Wednesday, May 9th, 2018. You may send your transmission at any time that day. If you have a wireless device, the transmission will be sent automatically. After your physician reviews your transmission, you will receive a postcard with your next transmission date.  Dr Royann Shivers recommends that you schedule a follow-up appointment in 12 months with a pacemaker check. You will receive a reminder letter in the mail two months in advance. If you don't receive a letter, please call our office to schedule the follow-up appointment.  If you need a refill on your cardiac medications before your next appointment, please call your pharmacy.

## 2016-07-22 NOTE — Progress Notes (Signed)
Patient ID: Marc Potter, male   DOB: 1945/09/06, 71 y.o.   MRN: [id number]    Cardiology Office Note    Date:  07/22/2016   ID:  Marc Potter, DOB 1946/01/24, MRN [id number]  PCP:  Nonnie Done., MD  Cardiologist: [name], M.D.;  Thurmon Fair, MD   Chief Complaint  Patient presents with  . Follow-up    History of Present Illness:  Marc Potter is a 71 y.o. male returning in follow-up after implantation of a defibrillator in December 2016 for primary prevention in the setting of nonischemic cardiomyopathy with severely depressed left ventricular systolic function and chronic atrial fibrillation.   Just before Christmas he was admitted to the hospital with acute DVT of the left upper extremity, likely associated with his pacemaker leads. This occurred despite the fact that he was taking Xarelto and he was switched to warfarin. Upper extremity swelling has resolved and he has no symptoms related to this.  His single-chamber Medtronic Evera device is functioning normally, generator longevity is estimated at 10.5 years. R waves are 12.1 mV, impedance 399 ohms, threshold 0.75 V is 0.4 ms pulse width, he only has 0.2% ventricular pacing. The device logs activity at around 3.5 hours per day on the average. Optivol showed a transient increase in fluid around the holidays which has subsequently resolved spontaneously  Complains of easy bruising but has not had any other bleeding complications on warfarin anticoagulation.  He denies defibrillator shocks, discomfort defibrillator site, palpitations, chest pain, exertional dyspnea, leg edema, focal neurological events, claudication. Despite low blood pressure he denies dizziness or fatigue or syncope. Does not need diuretics.  Past Medical History:  Diagnosis Date  . AICD (automatic cardioverter/defibrillator) present 06/04/2015    Medtronic Evera XT VR model [id number] serial number [id number]  . Atrial fibrillation (HCC)   .  Heart failure with reduced ejection fraction, NYHA class II (HCC)    a. EF 25-30% by echo in 02/2015  . Heart palpitations   . Hyperlipidemia   . Hypertension   . Left ventricular dysfunction   . Normal coronary arteries    by outpatient diagnostic coronary arteriography performed by Dr Allyson Sabal 12/24/14.    Past Surgical History:  Procedure Laterality Date  . CARDIAC CATHETERIZATION N/A 12/24/2014   Procedure: Left Heart Cath and Coronary Angiography;  Surgeon: Runell Gess, MD;  Location[facility] INVASIVE CV LAB;  Service: Cardiovascular;  Laterality: N/A;  . DOPPLER ECHOCARDIOGRAPHY  2012  . EP IMPLANTABLE DEVICE N/A 06/04/2015   Procedure: ICD Implant;  Surgeon: Thurmon Fair, MD; Medtronic [place] VR model [id number] serial number [id number]; Laterality: N/A;  . NM MYOVIEW LTD  2012    Outpatient Medications Prior to Visit  Medication Sig Dispense Refill  . carvedilol (COREG) 25 MG tablet Take 1 tablet (25 mg total) by mouth 2 (two) times daily. 180 tablet 3  . Coenzyme Q10 (CO Q-10) 100 MG CAPS Take 1 tablet by mouth daily.    [name] enoxaparin (LOVENOX) 150 MG/ML injection Inject 0.89 mLs (135 mg total) into the skin daily. (Patient taking differently: Inject 1.5 mg/kg into the skin as needed. ) 8 Syringe 0  . glucosamine-chondroitin 500-400 MG tablet Take 1 tablet by mouth 2 (two) times daily.    [name] lisinopril (PRINIVIL,ZESTRIL) 10 MG tablet Take 1 tablet (10 mg total) by mouth daily. 90 tablet 3  . Multiple Vitamin (MULTIVITAMIN) tablet Take 1 tablet by mouth daily.    . multivitamin-lutein (OCUVITE-LUTEIN) CAPS capsule Take 1 capsule by mouth  daily.    . thyroid (ARMOUR) 90 MG tablet Take 90 mg by mouth daily.    [name] warfarin (COUMADIN) 5 MG tablet Take 1.5 tablets by mouth daily or as directed by coumadin clinic 45 tablet 3   No facility-administered medications prior to visit.      Allergies:   Patient has no known allergies.   Social History   Social History  . Marital status:  Married    Spouse name: N/A  . Number of children: N/A  . Years of education: N/A   Social History Main Topics  . Smoking status: Former Smoker    Quit date: 06/15/1993  . Smokeless tobacco: Never Used  . Alcohol use Yes     Comment: DRINKS ON OCCASION   . Drug use: No  . Sexual activity: Not Asked   Other Topics Concern  . None   Social History Narrative  . None     Family History:  The patient's family history includes Cancer in his father; Diabetes in his mother; Heart failure in his father; Hypertension in his mother.   ROS:   Please see the history of present illness.    ROS All other systems reviewed and are negative.   PHYSICAL EXAM:   VS:  BP 90/60   Pulse 68   Ht 6' (1.829 m)   Wt 87 kg (191 lb 12.8 oz)   BMI 26.01 kg/m    GEN: Well nourished, well developed, in no acute distress  HEENT: normal  Neck: no JVD, carotid bruits, or masses Cardiac: Irregular, no murmurs, rubs, or gallops,no edema , well-healed left subclavian defibrillator site Respiratory:  clear to auscultation bilaterally, normal work of breathing GI: soft, nontender, nondistended, + BS MS: no deformity or atrophy  Skin: warm and dry, no rash Neuro:  Alert and Oriented x 3, Strength and sensation are intact Psych: euthymic mood, full affect  Wt Readings from Last 3 Encounters:  07/22/16 87 kg (191 lb 12.8 oz)  06/12/16 87.1 kg (192 lb)  06/09/16 88.5 kg (195 lb)      Studies/Labs Reviewed:   EKG:  EKG is not ordered today.   Recent Labs: 06/07/2016: ALT 25; BUN 14; Creatinine, Ser 0.89; Potassium 4.4; Sodium 139 06/09/2016: Hemoglobin 13.8; Platelets 160   Lipid Panel    Component Value Date/Time   CHOL 164 12/18/2014 0959   TRIG 209 (H) 12/18/2014 0959   HDL 43 12/18/2014 0959   CHOLHDL 3.8 12/18/2014 0959   VLDL 42 (H) 12/18/2014 0959   LDLCALC 79 12/18/2014 0959     ASSESSMENT:    1. Chronic systolic CHF (congestive heart failure), NYHA class 2 (HCC)   2. At risk  for sudden cardiac death   3. Non-ischemic cardiomyopathy (HCC)      PLAN:  In order of problems listed above:  1. CHF: Well compensated heart failure, functional class I-II, clinically euvolemic without the need for diuretics. On appropriate doses of beta blocker. There is no room to increase his ACE inhibitor dose or to switch to [employer]. 2. AFib: rate control is good. He is on appropriate anticoagulation.CHADSVasc 3 (age, HTN, HF). 3. ICD: Normal defibrillator function. Discussed follow-up via [facility] remotely every 3 months and yearly office visits. Reviewed his "shock plan". 4. Left upper extremity DVT: This occurred unusually late following ICD implantation but was most certainly related to the presence of the defibrillator lead. He is on warfarin since the clot occurred while he was compliant with full dose Xarelto.  The swelling has resolved and at least at this point I see no evidence of collateral vein formation to suggest that the vein is chronically occluded   Medication Adjustments/Labs and Tests Ordered: Current medicines are reviewed at length with the patient today.  Concerns regarding medicines are outlined above.  Medication changes, Labs and Tests ordered today are listed in the Patient Instructions below. Patient Instructions  Dr Royann Shivers recommends that you continue on your current medications as directed. Please refer to the Current Medication list given to you today.  Remote monitoring is used to monitor your Pacemaker or ICD from home. This monitoring reduces the number of office visits required to check your device to one time per year. It allows [place] to keep an eye on the functioning of your device to ensure it is working properly. You are scheduled for a device check from home on Wednesday, May 9th, 2018. You may send your transmission at any time that day. If you have a wireless device, the transmission will be sent automatically. After your physician reviews your  transmission, you will receive a postcard with your next transmission date.  Dr Royann Shivers recommends that you schedule a follow-up appointment in 12 months with a pacemaker check. You will receive a reminder letter in the mail two months in advance. If you don't receive a letter, please call our office to schedule the follow-up appointment.  If you need a refill on your cardiac medications before your next appointment, please call your pharmacy.      Signed, Thurmon Fair, MD  07/22/2016 10:35 AM    [facility] [address] [place], [place], [place]  [address] Phone: [phone]; Fax: [phone]

## 2016-08-14 ENCOUNTER — Ambulatory Visit (INDEPENDENT_AMBULATORY_CARE_PROVIDER_SITE_OTHER): Payer: Medicare Other | Admitting: Pharmacist Clinician (PhC)/ Clinical Pharmacy Specialist

## 2016-08-14 DIAGNOSIS — I82A12 Acute embolism and thrombosis of left axillary vein: Secondary | ICD-10-CM

## 2016-08-14 LAB — POCT INR: INR: 3.6

## 2016-08-25 ENCOUNTER — Telehealth: Payer: Self-pay | Admitting: Cardiology

## 2016-08-25 NOTE — Telephone Encounter (Signed)
Spoke with pt and reminded pt of remote transmission that is due today. Pt verbalized understanding.   

## 2016-08-27 NOTE — Progress Notes (Signed)
No ICM remote transmission received for 08/25/2016 and next ICM transmission scheduled for 09/15/2016.

## 2016-09-04 ENCOUNTER — Ambulatory Visit (INDEPENDENT_AMBULATORY_CARE_PROVIDER_SITE_OTHER): Payer: Medicare Other | Admitting: Pharmacist

## 2016-09-04 DIAGNOSIS — I82A12 Acute embolism and thrombosis of left axillary vein: Secondary | ICD-10-CM | POA: Diagnosis not present

## 2016-09-04 LAB — POCT INR: INR: 3.9

## 2016-09-15 ENCOUNTER — Telehealth: Payer: Self-pay | Admitting: Cardiology

## 2016-09-15 ENCOUNTER — Ambulatory Visit (INDEPENDENT_AMBULATORY_CARE_PROVIDER_SITE_OTHER): Payer: Medicare Other

## 2016-09-15 DIAGNOSIS — Z95810 Presence of automatic (implantable) cardiac defibrillator: Secondary | ICD-10-CM | POA: Diagnosis not present

## 2016-09-15 DIAGNOSIS — I5022 Chronic systolic (congestive) heart failure: Secondary | ICD-10-CM

## 2016-09-15 NOTE — Progress Notes (Signed)
Thanks - saw all the transmissions you sent today. All OK, so here is only one acknowledgment so I do not clog your in-basket. MCr

## 2016-09-15 NOTE — Progress Notes (Signed)
EPIC Encounter for ICM Monitoring  Patient Name: Marc Potter is a 71 y.o. male Date: 09/15/2016 Primary Care Physican: Nonnie Done., MD Primary Cardiologist:Berry Electrophysiologist: Croitoru Dry Weight:unknown      Attempted call to patient and unable to reach.  Left detailed message regarding transmission.  Transmission reviewed.    Thoracic impedance normal.  No diuretic  Recommendations: Left voice mail with ICM number and encouraged to call for fluid symptoms.  Follow-up plan: ICM clinic phone appointment on 10/21/2016.  Office appointment scheduled on 11/10/2016 with Dr Allyson Sabal.  Copy of ICM check sent to device physician.   3 month ICM trend: 09/15/2016   1 Year ICM trend:      Karie Soda, RN 09/15/2016 11:19 AM

## 2016-09-15 NOTE — Telephone Encounter (Signed)
LMOVM reminding pt to send remote transmission.   

## 2016-09-20 IMAGING — CR DG CHEST 2V
2 series · 2 of 2 positions shown · non-contrast
Comparison: PA and lateral chest x-ray December 20, 2014.

CLINICAL DATA: Status post AICD insertion on June 04, 2015

EXAM:
CHEST  2 VIEW

[chest pa]
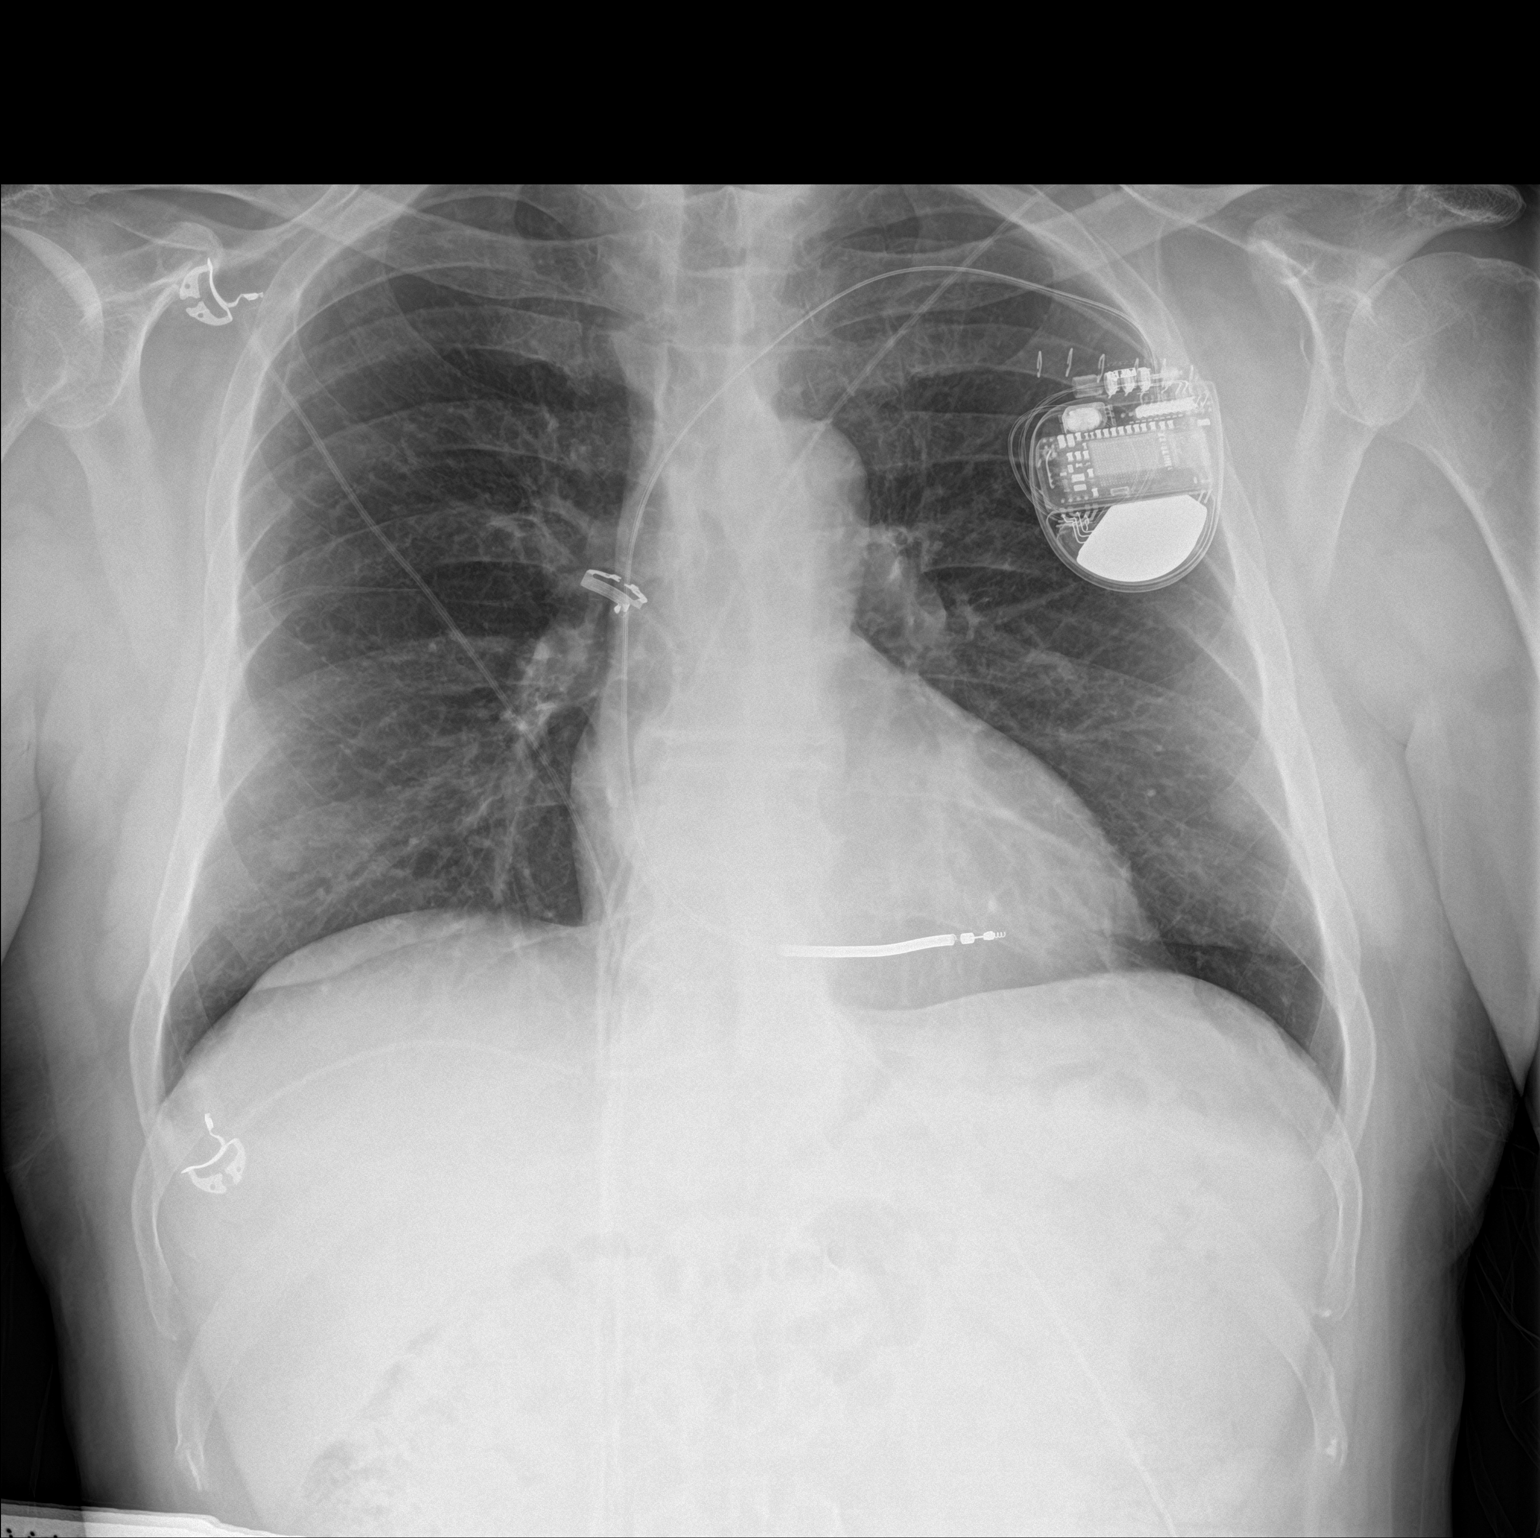

[chest lat]
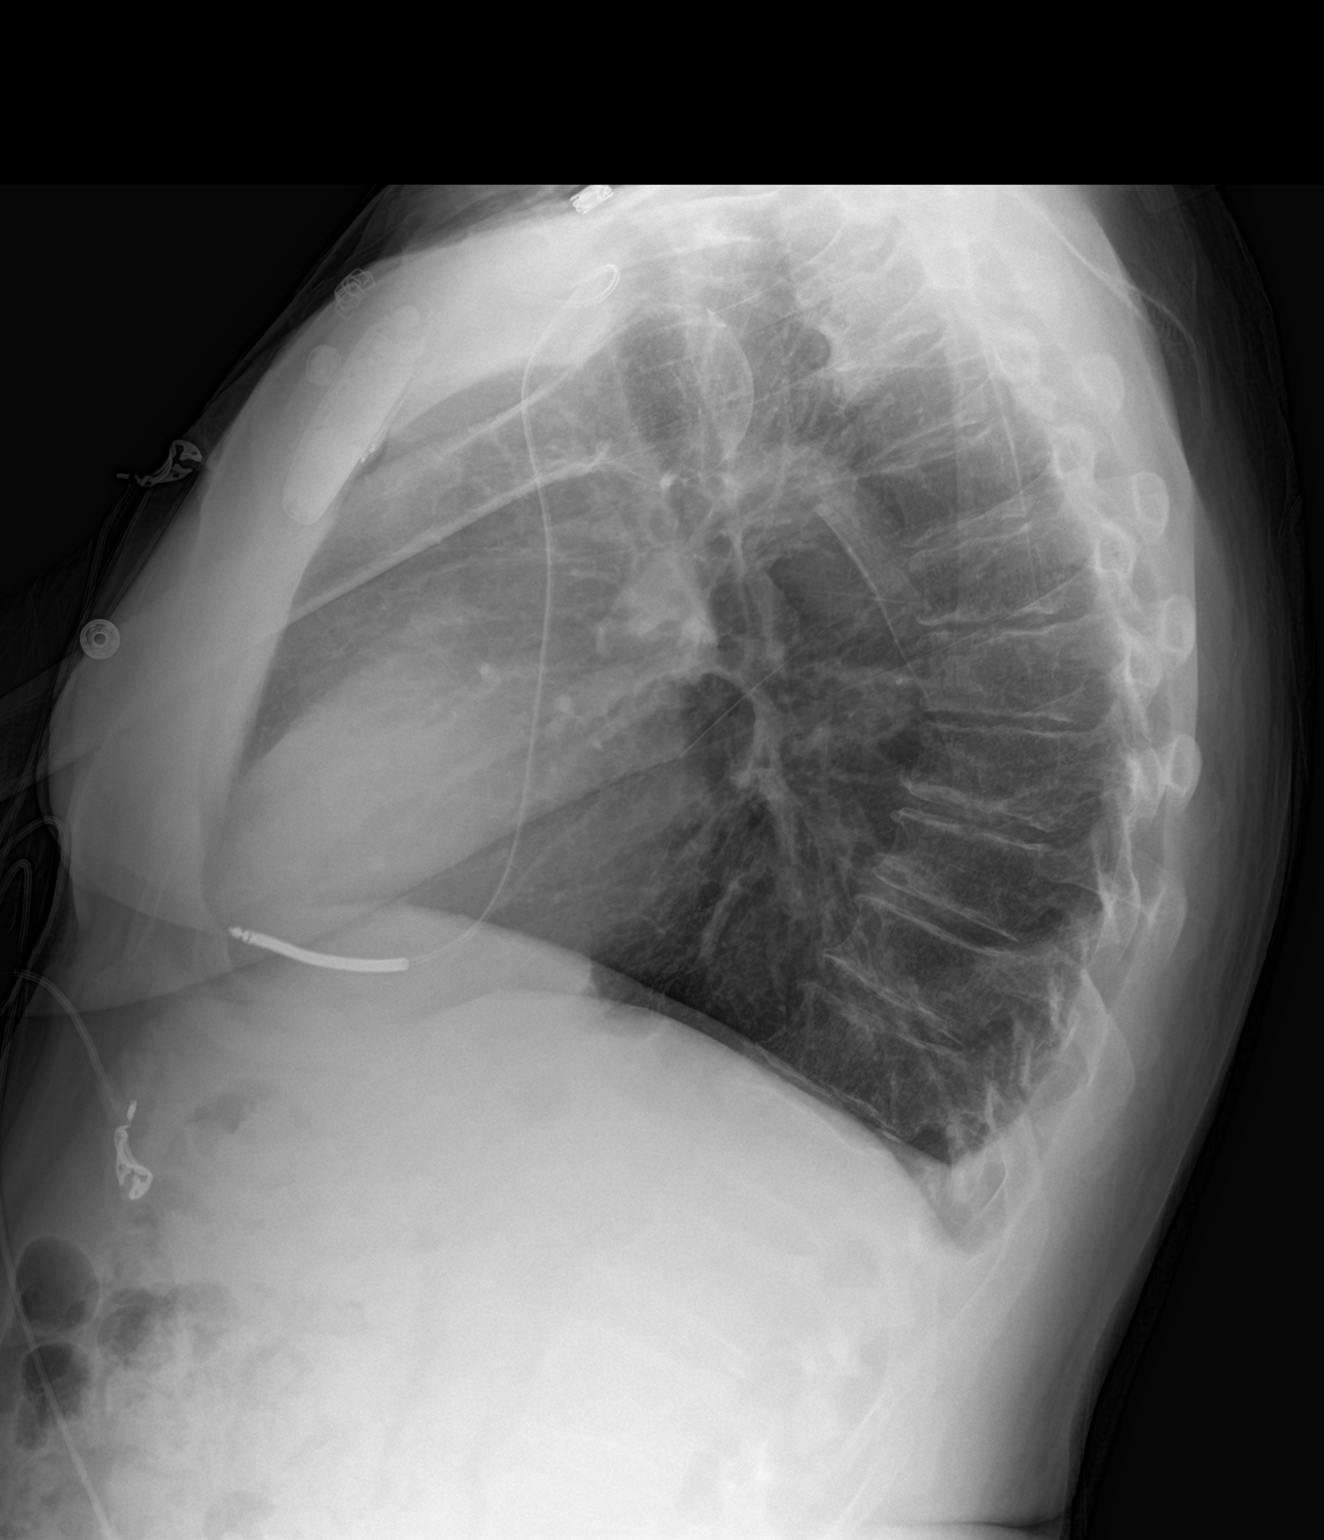

[2 of 2 positions shown; findings below may reference images not displayed]

FINDINGS: The lungs are well-expanded and clear. There is no pleural effusion
or pneumothorax. Nipple shadows are visible bilaterally The heart
and pulmonary vascularity are normal. The bony thorax exhibits no
acute abnormality. The implantable AICD is in reasonable position
radiographically.
IMPRESSION: There is no postprocedure complication following AICD placement.
There is no active cardiopulmonary disease.

## 2016-09-25 ENCOUNTER — Ambulatory Visit (INDEPENDENT_AMBULATORY_CARE_PROVIDER_SITE_OTHER): Payer: Medicare Other | Admitting: Pharmacist Clinician (PhC)/ Clinical Pharmacy Specialist

## 2016-09-25 DIAGNOSIS — Z8042 Family history of malignant neoplasm of prostate: Secondary | ICD-10-CM | POA: Diagnosis not present

## 2016-09-25 DIAGNOSIS — I82A12 Acute embolism and thrombosis of left axillary vein: Secondary | ICD-10-CM | POA: Diagnosis not present

## 2016-09-25 DIAGNOSIS — R972 Elevated prostate specific antigen [PSA]: Secondary | ICD-10-CM | POA: Diagnosis not present

## 2016-09-25 DIAGNOSIS — N400 Enlarged prostate without lower urinary tract symptoms: Secondary | ICD-10-CM | POA: Diagnosis not present

## 2016-09-25 LAB — POCT INR: INR: 4.5

## 2016-10-13 ENCOUNTER — Ambulatory Visit (INDEPENDENT_AMBULATORY_CARE_PROVIDER_SITE_OTHER): Payer: Medicare Other | Admitting: Pharmacist Clinician (PhC)/ Clinical Pharmacy Specialist

## 2016-10-13 DIAGNOSIS — I82A12 Acute embolism and thrombosis of left axillary vein: Secondary | ICD-10-CM

## 2016-10-13 LAB — POCT INR: INR: 2.8

## 2016-10-21 ENCOUNTER — Ambulatory Visit (INDEPENDENT_AMBULATORY_CARE_PROVIDER_SITE_OTHER): Payer: Medicare Other | Admitting: *Deleted

## 2016-10-21 DIAGNOSIS — Z95810 Presence of automatic (implantable) cardiac defibrillator: Secondary | ICD-10-CM | POA: Diagnosis not present

## 2016-10-21 DIAGNOSIS — I428 Other cardiomyopathies: Secondary | ICD-10-CM

## 2016-10-21 DIAGNOSIS — I82A12 Acute embolism and thrombosis of left axillary vein: Secondary | ICD-10-CM

## 2016-10-21 NOTE — Progress Notes (Signed)
Remote ICD transmission.   

## 2016-10-22 LAB — CUP PACEART REMOTE DEVICE CHECK
HIGH POWER IMPEDANCE MEASURED VALUE: 67 Ohm
Implantable Lead Implant Date: 20161220
Implantable Pulse Generator Implant Date: 20161220
Lead Channel Impedance Value: 323 Ohm
Lead Channel Pacing Threshold Amplitude: 0.625 V
Lead Channel Pacing Threshold Pulse Width: 0.4 ms
Lead Channel Sensing Intrinsic Amplitude: 11.75 mV
Lead Channel Sensing Intrinsic Amplitude: 11.75 mV
Lead Channel Setting Pacing Pulse Width: 0.4 ms
Lead Channel Setting Sensing Sensitivity: 0.3 mV
MDC IDC LEAD LOCATION: 753860
MDC IDC MSMT BATTERY REMAINING LONGEVITY: 123 mo
MDC IDC MSMT BATTERY VOLTAGE: 3.02 V
MDC IDC MSMT LEADCHNL RV IMPEDANCE VALUE: 342 Ohm
MDC IDC SESS DTM: 20180509052303
MDC IDC SET LEADCHNL RV PACING AMPLITUDE: 2.5 V
MDC IDC STAT BRADY RV PERCENT PACED: 0.05 %

## 2016-10-22 NOTE — Progress Notes (Signed)
EPIC Encounter for ICM Monitoring  Patient Name: Marc Potter is a 71 y.o. male Date: 10/22/2016 Primary Care Physican: Nonnie Done., MD Primary Cardiologist:Berry Electrophysiologist: Croitoru Dry Weight:unknown              Heart Failure questions reviewed, pt asymptomatic.   Thoracic impedance slightly baseline and he reported eating at restaurants for the last 2 days.  He joked with his wife the eating out would show up on his report.   No diuretic  Recommendations: No changes. Advised to limit salt intake to 2000 mg/day and fluid intake to < 2 liters/day.  Encouraged to call for fluid symptoms or use local ER for any urgent symptoms.  Follow-up plan: ICM clinic phone appointment on 11/23/2016.  Office appointment scheduled on 11/10/2016 with Dr Allyson Sabal.  Copy of ICM check sent to primary cardiologist and device physician.   3 month ICM trend: 10/21/2016  1 Year ICM trend:      Karie Soda, RN 10/22/2016 4:25 PM

## 2016-10-23 ENCOUNTER — Encounter: Payer: Self-pay | Admitting: Cardiology

## 2016-11-06 ENCOUNTER — Encounter: Payer: Self-pay | Admitting: Cardiology

## 2016-11-10 ENCOUNTER — Ambulatory Visit (INDEPENDENT_AMBULATORY_CARE_PROVIDER_SITE_OTHER): Payer: Medicare Other | Admitting: Cardiovascular Disease

## 2016-11-10 ENCOUNTER — Encounter: Payer: Self-pay | Admitting: Cardiovascular Disease

## 2016-11-10 ENCOUNTER — Ambulatory Visit (INDEPENDENT_AMBULATORY_CARE_PROVIDER_SITE_OTHER): Payer: Medicare Other | Admitting: Pharmacist

## 2016-11-10 VITALS — BP 118/70 | HR 87 | Ht 72.0 in | Wt 197.0 lb

## 2016-11-10 DIAGNOSIS — E7800 Pure hypercholesterolemia, unspecified: Secondary | ICD-10-CM

## 2016-11-10 DIAGNOSIS — I82622 Acute embolism and thrombosis of deep veins of left upper extremity: Secondary | ICD-10-CM | POA: Diagnosis not present

## 2016-11-10 DIAGNOSIS — I482 Chronic atrial fibrillation, unspecified: Secondary | ICD-10-CM

## 2016-11-10 DIAGNOSIS — I428 Other cardiomyopathies: Secondary | ICD-10-CM | POA: Diagnosis not present

## 2016-11-10 DIAGNOSIS — I10 Essential (primary) hypertension: Secondary | ICD-10-CM | POA: Diagnosis not present

## 2016-11-10 DIAGNOSIS — I82A12 Acute embolism and thrombosis of left axillary vein: Secondary | ICD-10-CM

## 2016-11-10 LAB — POCT INR: INR: 3.3

## 2016-11-10 NOTE — Patient Instructions (Signed)

## 2016-11-10 NOTE — Assessment & Plan Note (Signed)
History of chronic atrial fibrillation rate controlled on carvedilol currently on Coumadin anticoagulation.

## 2016-11-10 NOTE — Assessment & Plan Note (Signed)
History of hypertension blood pressure measures 118/70. He is on carvedilol and lisinopril. Continue current meds at current dosing

## 2016-11-10 NOTE — Assessment & Plan Note (Signed)
History of nonischemic cardiomyopathy confirmed by cardiac catheterization which I performed 12/24/14. Right radial approach. He ultimately had an ICD placed by Dr. Royann Shivers for primary prevention. Ultimately, with optimal medical therapy, his EF has improved from 25-30% range up to 45-50% most recently by 2-D echo 06/09/16.

## 2016-11-10 NOTE — Progress Notes (Signed)
11/10/2016 Marc Potter   18-Sep-1945  [id number]  Primary Physician Egbert Garibaldi, Excell Seltzer., MD Primary Cardiologist: Runell Gess MD Roseanne Reno  HPI:  Marc Potter is a 71 year old moderately overweight married Caucasian male father of one biologic and 2 stepchildren, grandfather and 6 grandchildren whose mother-in-law, Marc Potter , is a patient of mine as well. I last saw him in the office 11/01/15.He has history of hypertension and hyperlipidemia. He is a normal 2-D echo Myoview in the past. He had PAF on event monitoring she was symptomatic from it at that point several years ago I elected to begin him on aspirin alone. He does admit to mild increasing dyspnea on exertion but denies chest pain. He underwent outpatient diagnostic coronary arteriography by myself on 12/24/14 via the right radial approach revealing normal coronary arteries and severe LV dysfunction with an EF of 30%. His dyspnea has significantly improved. He remains in A. Fib with a ventricular response in the 70s today. He is on Savaysa oral anticoagulation. His carvedilol was titrated to 12.5 mg by mouth twice a day and ultimately up to 25 mg twice a day with the addition of lisinopril as well. Recent 2-D echo performed 03/04/15 revealed a persistent diminished EF in the 25-30% range although he is not symptomatic. I referred him to Dr. Royann Shivers for consideration of ICD implantation for primary prevention of sudden cardiac death. This was performed 2015/06/15 uneventfully. Since I saw him in the office last he has done well. He did develop a left axillary vein DVT back in December resulting in transitioning from a novel or oral anticoagulant to warfarin. His most recent 2-D echo performed 06/09/16 revealed an improvement in his ejection fraction from 25-30% up to 45-50%.    Current Outpatient Prescriptions  Medication Sig Dispense Refill  . carvedilol (COREG) 25 MG tablet Take 1 tablet (25 mg total) by mouth 2 (two)  times daily. 180 tablet 3  . Coenzyme Q10 (CO Q-10) 100 MG CAPS Take 1 tablet by mouth daily.    [name] glucosamine-chondroitin 500-400 MG tablet Take 1 tablet by mouth 2 (two) times daily.    [name] lisinopril (PRINIVIL,ZESTRIL) 10 MG tablet Take 1 tablet (10 mg total) by mouth daily. 90 tablet 3  . Multiple Vitamin (MULTIVITAMIN) tablet Take 1 tablet by mouth daily.    . multivitamin-lutein (OCUVITE-LUTEIN) CAPS capsule Take 1 capsule by mouth daily.    [name] thyroid (ARMOUR) 90 MG tablet Take 90 mg by mouth daily.    [name] warfarin (COUMADIN) 5 MG tablet Take 1.5 tablets by mouth daily or as directed by coumadin clinic 45 tablet 3   No current facility-administered medications for this visit.     No Known Allergies  Social History   Social History  . Marital status: Married    Spouse name: N/A  . Number of children: N/A  . Years of education: N/A   Occupational History  . Not on file.   Social History Main Topics  . Smoking status: Former Smoker    Quit date: 06/15/1993  . Smokeless tobacco: Never Used  . Alcohol use Yes     Comment: DRINKS ON OCCASION   . Drug use: No  . Sexual activity: Not on file   Other Topics Concern  . Not on file   Social History Narrative  . No narrative on file     Review of Systems: General: negative for chills, fever, night sweats or weight changes.  Cardiovascular: negative for  chest pain, dyspnea on exertion, edema, orthopnea, palpitations, paroxysmal nocturnal dyspnea or shortness of breath Dermatological: negative for rash Respiratory: negative for cough or wheezing Urologic: negative for hematuria Abdominal: negative for nausea, vomiting, diarrhea, bright red blood per rectum, melena, or hematemesis Neurologic: negative for visual changes, syncope, or dizziness All other systems reviewed and are otherwise negative except as noted above.    Blood pressure 118/70, pulse 87, height 6' (1.829 m), weight 197 lb (89.4 kg).  General appearance: alert  and no distress Neck: no adenopathy, no carotid bruit, no JVD, supple, symmetrical, trachea midline and thyroid not enlarged, symmetric, no tenderness/mass/nodules Lungs: clear to auscultation bilaterally Heart: irregularly irregular rhythm Extremities: extremities normal, atraumatic, no cyanosis or edema  EKG atrial fibrillation with a ventricular response of 87 and septal Q waves. I personally reviewed this EKG.  ASSESSMENT AND PLAN:   Essential hypertension History of hypertension blood pressure measures 118/70. He is on carvedilol and lisinopril. Continue current meds at current dosing  Hyperlipidemia History of hyperlipidemia not on statin therapy followed by his PCP  Atrial fibrillation (HCC) History of chronic atrial fibrillation rate controlled on carvedilol currently on Coumadin anticoagulation.  Non-ischemic cardiomyopathy (HCC) History of nonischemic cardiomyopathy confirmed by cardiac catheterization which I performed 12/24/14. Right radial approach. He ultimately had an ICD placed by Dr. Royann Shivers for primary prevention. Ultimately, with optimal medical therapy, his EF has improved from 25-30% range up to 45-50% most recently by 2-D echo 06/09/16.  Acute deep vein thrombosis (DVT) of left upper extremity [facility]) Marc Potter had a left axillary DVT back in December with left upper extremity swelling. He is placed on IV heparin and his level on the right leg was discontinued. Ultimately the swelling improved and he was transitioned to warfarin.      Runell Gess MD FACP,FACC,FAHA, [facility] 11/10/2016 4:30 PM

## 2016-11-10 NOTE — Assessment & Plan Note (Signed)
History of hyperlipidemia not on statin therapy followed by his PCP 

## 2016-11-10 NOTE — Assessment & Plan Note (Signed)
Mr. Brockmann had a left axillary DVT back in December with left upper extremity swelling. He is placed on IV heparin and his level on the right leg was discontinued. Ultimately the swelling improved and he was transitioned to warfarin.

## 2016-11-14 ENCOUNTER — Other Ambulatory Visit: Payer: Self-pay | Admitting: Cardiovascular Disease

## 2016-11-23 ENCOUNTER — Ambulatory Visit (INDEPENDENT_AMBULATORY_CARE_PROVIDER_SITE_OTHER): Payer: Medicare Other

## 2016-11-23 DIAGNOSIS — I5022 Chronic systolic (congestive) heart failure: Secondary | ICD-10-CM | POA: Diagnosis not present

## 2016-11-23 DIAGNOSIS — Z95810 Presence of automatic (implantable) cardiac defibrillator: Secondary | ICD-10-CM

## 2016-11-23 NOTE — Progress Notes (Signed)
EPIC Encounter for ICM Monitoring  Patient Name: Marc Potter is a 71 y.o. male Date: 11/23/2016 Primary Care Physican: Nonnie Done., MD Primary Cardiologist:Berry Electrophysiologist: Croitoru Dry Weight:unknown            Heart Failure questions reviewed, pt asymptomatic.   Thoracic impedance abnormal suggesting fluid accumulation since 11/16/2016 and he was in [place] for a few days for a class.    No diuretic  Recommendations: No changes.  Advised to limit salt intake to 2000 mg/day and fluid intake to < 2 liters/day.  Encouraged to call for fluid symptoms.  Follow-up plan: ICM clinic phone appointment on 12/08/2016 to recheck fluid levels.      Copy of ICM check sent to primary cardiologist and device physician.   3 month ICM trend: 11/23/2016   1 Year ICM trend:      Karie Soda, RN 11/23/2016 11:17 AM

## 2016-12-03 DIAGNOSIS — H5213 Myopia, bilateral: Secondary | ICD-10-CM | POA: Diagnosis not present

## 2016-12-03 DIAGNOSIS — H353132 Nonexudative age-related macular degeneration, bilateral, intermediate dry stage: Secondary | ICD-10-CM | POA: Diagnosis not present

## 2016-12-03 DIAGNOSIS — H2513 Age-related nuclear cataract, bilateral: Secondary | ICD-10-CM | POA: Diagnosis not present

## 2016-12-04 ENCOUNTER — Ambulatory Visit (INDEPENDENT_AMBULATORY_CARE_PROVIDER_SITE_OTHER): Payer: Medicare Other | Admitting: Pharmacist Clinician (PhC)/ Clinical Pharmacy Specialist

## 2016-12-04 DIAGNOSIS — I82A12 Acute embolism and thrombosis of left axillary vein: Secondary | ICD-10-CM

## 2016-12-04 DIAGNOSIS — I82622 Acute embolism and thrombosis of deep veins of left upper extremity: Secondary | ICD-10-CM | POA: Diagnosis not present

## 2016-12-04 LAB — POCT INR: INR: 2.5

## 2016-12-08 ENCOUNTER — Ambulatory Visit (INDEPENDENT_AMBULATORY_CARE_PROVIDER_SITE_OTHER): Payer: Self-pay

## 2016-12-08 DIAGNOSIS — I5022 Chronic systolic (congestive) heart failure: Secondary | ICD-10-CM

## 2016-12-08 DIAGNOSIS — Z95810 Presence of automatic (implantable) cardiac defibrillator: Secondary | ICD-10-CM

## 2016-12-08 NOTE — Progress Notes (Signed)
EPIC Encounter for ICM Monitoring  Patient Name: Marc Potter is a 71 y.o. male Date: 12/08/2016 Primary Care Physican: Nonnie Done., MD Primary Cardiologist:Berry Electrophysiologist: Croitoru Dry Weight:unknown      Heart Failure questions reviewed, pt asymptomatic.   Thoracic impedance normal.  No diuretic  Recommendations: No changes.  Encouraged to call for fluid symptoms.  Follow-up plan: ICM clinic phone appointment on 01/25/2017.    Copy of ICM check sent to device physician.   3 month ICM trend: 12/08/2016   1 Year ICM trend:      Karie Soda, RN 12/08/2016 11:42 AM

## 2017-01-01 ENCOUNTER — Ambulatory Visit (INDEPENDENT_AMBULATORY_CARE_PROVIDER_SITE_OTHER): Payer: Medicare Other | Admitting: Pharmacist

## 2017-01-01 DIAGNOSIS — I82A12 Acute embolism and thrombosis of left axillary vein: Secondary | ICD-10-CM | POA: Diagnosis not present

## 2017-01-01 DIAGNOSIS — I82622 Acute embolism and thrombosis of deep veins of left upper extremity: Secondary | ICD-10-CM | POA: Diagnosis not present

## 2017-01-01 LAB — POCT INR: INR: 4.3

## 2017-01-22 ENCOUNTER — Ambulatory Visit (INDEPENDENT_AMBULATORY_CARE_PROVIDER_SITE_OTHER): Payer: Medicare Other | Admitting: Pharmacist

## 2017-01-22 DIAGNOSIS — I82622 Acute embolism and thrombosis of deep veins of left upper extremity: Secondary | ICD-10-CM | POA: Diagnosis not present

## 2017-01-22 DIAGNOSIS — I82A12 Acute embolism and thrombosis of left axillary vein: Secondary | ICD-10-CM | POA: Diagnosis not present

## 2017-01-22 LAB — POCT INR: INR: 3.4

## 2017-01-25 ENCOUNTER — Ambulatory Visit (INDEPENDENT_AMBULATORY_CARE_PROVIDER_SITE_OTHER): Payer: Medicare Other | Admitting: *Deleted

## 2017-01-25 DIAGNOSIS — I428 Other cardiomyopathies: Secondary | ICD-10-CM | POA: Diagnosis not present

## 2017-01-25 DIAGNOSIS — I5022 Chronic systolic (congestive) heart failure: Secondary | ICD-10-CM | POA: Diagnosis not present

## 2017-01-25 DIAGNOSIS — Z95810 Presence of automatic (implantable) cardiac defibrillator: Secondary | ICD-10-CM

## 2017-01-26 LAB — CUP PACEART REMOTE DEVICE CHECK
Battery Remaining Longevity: 121 mo
Date Time Interrogation Session: 20180813233626
HIGH POWER IMPEDANCE MEASURED VALUE: 71 Ohm
Implantable Lead Implant Date: 20161220
Lead Channel Impedance Value: 323 Ohm
Lead Channel Pacing Threshold Amplitude: 0.75 V
Lead Channel Sensing Intrinsic Amplitude: 12.875 mV
Lead Channel Sensing Intrinsic Amplitude: 12.875 mV
Lead Channel Setting Pacing Pulse Width: 0.4 ms
MDC IDC LEAD LOCATION: 753860
MDC IDC MSMT BATTERY VOLTAGE: 3.01 V
MDC IDC MSMT LEADCHNL RV IMPEDANCE VALUE: 399 Ohm
MDC IDC MSMT LEADCHNL RV PACING THRESHOLD PULSEWIDTH: 0.4 ms
MDC IDC PG IMPLANT DT: 20161220
MDC IDC SET LEADCHNL RV PACING AMPLITUDE: 2.5 V
MDC IDC SET LEADCHNL RV SENSING SENSITIVITY: 0.3 mV
MDC IDC STAT BRADY RV PERCENT PACED: 0.1 %

## 2017-01-26 NOTE — Progress Notes (Signed)
EPIC Encounter for ICM Monitoring  Patient Name: Marc Potter is a 71 y.o. male Date: 01/26/2017 Primary Care Physican: Nonnie Done., MD Primary Cardiologist:Berry Electrophysiologist: Croitoru Dry Weight:unknown                                               Heart Failure questions reviewed, pt asymptomatic.   Thoracic impedance normal.  No diuretic  Recommendations: No changes.   Encouraged to call for fluid symptoms.  Follow-up plan: ICM clinic phone appointment on 02/26/2017.    Copy of ICM check sent to device physician.   3 month ICM trend: 01/26/2017   1 Year ICM trend:      Karie Soda, RN 01/26/2017 9:05 AM

## 2017-01-26 NOTE — Progress Notes (Signed)
ICD Remote Transmission  

## 2017-02-04 ENCOUNTER — Encounter: Payer: Self-pay | Admitting: Cardiology

## 2017-02-19 ENCOUNTER — Ambulatory Visit (INDEPENDENT_AMBULATORY_CARE_PROVIDER_SITE_OTHER): Payer: Medicare Other | Admitting: Pharmacist

## 2017-02-19 DIAGNOSIS — I82622 Acute embolism and thrombosis of deep veins of left upper extremity: Secondary | ICD-10-CM

## 2017-02-19 DIAGNOSIS — Z7901 Long term (current) use of anticoagulants: Secondary | ICD-10-CM

## 2017-02-19 DIAGNOSIS — I82A12 Acute embolism and thrombosis of left axillary vein: Secondary | ICD-10-CM | POA: Diagnosis not present

## 2017-02-19 LAB — POCT INR: INR: 3.4

## 2017-02-26 ENCOUNTER — Ambulatory Visit (INDEPENDENT_AMBULATORY_CARE_PROVIDER_SITE_OTHER): Payer: Medicare Other

## 2017-02-26 DIAGNOSIS — I5022 Chronic systolic (congestive) heart failure: Secondary | ICD-10-CM | POA: Diagnosis not present

## 2017-02-26 DIAGNOSIS — Z95810 Presence of automatic (implantable) cardiac defibrillator: Secondary | ICD-10-CM | POA: Diagnosis not present

## 2017-02-26 NOTE — Progress Notes (Signed)
EPIC Encounter for ICM Monitoring  Patient Name: Marc Potter is a 71 y.o. male Date: 02/26/2017 Primary Care Physican: Nonnie Done., MD Primary Cardiologist:Berry Electrophysiologist: Croitoru Dry Weight:unknown       Attempted call to patient and unable to reach.  Left detailed message regarding transmission.  Transmission reviewed.    Thoracic impedance normal.  No diuretic  Recommendations: Left voice mail with ICM number and encouraged to call for fluid symptoms.  Follow-up plan: ICM clinic phone appointment on 03/29/2017.    Copy of ICM check sent to Dr. Royann Shivers.   3 month ICM trend: 02/26/2017   1 Year ICM trend:      Karie Soda, RN 02/26/2017 8:32 AM

## 2017-02-26 NOTE — Progress Notes (Signed)
Thanks you MCr 

## 2017-03-19 ENCOUNTER — Ambulatory Visit (INDEPENDENT_AMBULATORY_CARE_PROVIDER_SITE_OTHER): Payer: Medicare Other | Admitting: Pharmacist

## 2017-03-19 DIAGNOSIS — Z7901 Long term (current) use of anticoagulants: Secondary | ICD-10-CM | POA: Diagnosis not present

## 2017-03-19 DIAGNOSIS — I82A12 Acute embolism and thrombosis of left axillary vein: Secondary | ICD-10-CM

## 2017-03-19 DIAGNOSIS — I82622 Acute embolism and thrombosis of deep veins of left upper extremity: Secondary | ICD-10-CM

## 2017-03-19 LAB — POCT INR: INR: 2.2

## 2017-03-29 ENCOUNTER — Ambulatory Visit (INDEPENDENT_AMBULATORY_CARE_PROVIDER_SITE_OTHER): Payer: Medicare Other

## 2017-03-29 DIAGNOSIS — I5022 Chronic systolic (congestive) heart failure: Secondary | ICD-10-CM | POA: Diagnosis not present

## 2017-03-29 DIAGNOSIS — Z95810 Presence of automatic (implantable) cardiac defibrillator: Secondary | ICD-10-CM

## 2017-03-29 NOTE — Progress Notes (Signed)
EPIC Encounter for ICM Monitoring  Patient Name: Marc Potter is a 71 y.o. male Date: 03/29/2017 Primary Care Physican: Nonnie Done., MD Primary Cardiologist:Berry Electrophysiologist: Croitoru Dry Weight:185 lbs       Heart Failure questions reviewed, pt asymptomatic.   Thoracic impedance normal.  No diuretic  Recommendations: No changes.   Encouraged to call for fluid symptoms.  Follow-up plan: ICM clinic phone appointment on 04/29/2017.  Copy of ICM check sent to Dr. Royann Shivers.   3 month ICM trend: 03/29/2017   1 Year ICM trend:      Karie Soda, RN 03/29/2017 8:33 AM

## 2017-03-30 NOTE — Progress Notes (Signed)
Thank you MCr 

## 2017-04-16 ENCOUNTER — Other Ambulatory Visit: Payer: Self-pay | Admitting: Cardiovascular Disease

## 2017-04-19 ENCOUNTER — Other Ambulatory Visit: Payer: Self-pay | Admitting: Cardiovascular Disease

## 2017-04-19 DIAGNOSIS — Z79899 Other long term (current) drug therapy: Secondary | ICD-10-CM

## 2017-04-19 DIAGNOSIS — I519 Heart disease, unspecified: Secondary | ICD-10-CM

## 2017-04-19 DIAGNOSIS — I10 Essential (primary) hypertension: Secondary | ICD-10-CM

## 2017-04-19 DIAGNOSIS — I482 Chronic atrial fibrillation, unspecified: Secondary | ICD-10-CM

## 2017-04-23 ENCOUNTER — Ambulatory Visit (INDEPENDENT_AMBULATORY_CARE_PROVIDER_SITE_OTHER): Payer: Medicare Other | Admitting: Pharmacist

## 2017-04-23 DIAGNOSIS — Z7901 Long term (current) use of anticoagulants: Secondary | ICD-10-CM | POA: Diagnosis not present

## 2017-04-23 DIAGNOSIS — I82A12 Acute embolism and thrombosis of left axillary vein: Secondary | ICD-10-CM

## 2017-04-23 LAB — POCT INR: INR: 2

## 2017-04-29 ENCOUNTER — Telehealth: Payer: Self-pay | Admitting: Cardiology

## 2017-04-29 ENCOUNTER — Encounter: Payer: Medicare Other | Admitting: *Deleted

## 2017-04-29 NOTE — Telephone Encounter (Signed)
LMOVM reminding pt to send remote transmission.   

## 2017-04-30 ENCOUNTER — Encounter: Payer: Self-pay | Admitting: Cardiology

## 2017-05-03 ENCOUNTER — Ambulatory Visit (INDEPENDENT_AMBULATORY_CARE_PROVIDER_SITE_OTHER): Payer: Medicare Other

## 2017-05-03 DIAGNOSIS — Z95810 Presence of automatic (implantable) cardiac defibrillator: Secondary | ICD-10-CM | POA: Diagnosis not present

## 2017-05-03 DIAGNOSIS — I5022 Chronic systolic (congestive) heart failure: Secondary | ICD-10-CM | POA: Diagnosis not present

## 2017-05-03 NOTE — Progress Notes (Signed)
EPIC Encounter for ICM Monitoring  Patient Name: Marc Potter is a 71 y.o. male Date: 05/03/2017 Primary Care Physican: Nonnie Done., MD Primary Cardiologist:Berry Electrophysiologist: Croitoru Dry Weight:185 lbs      Heart Failure questions reviewed, pt asymptomatic.   Thoracic impedance normal.  No diuretic  Recommendations: No changes.   Encouraged to call for fluid symptoms.  Follow-up plan: ICM clinic phone appointment on 06/03/2017.    Copy of ICM check sent to Dr. Royann Shivers.   3 month ICM trend: 04/30/2017    1 Year ICM trend:       Karie Soda, RN 05/03/2017 11:33 AM

## 2017-05-05 DIAGNOSIS — J0100 Acute maxillary sinusitis, unspecified: Secondary | ICD-10-CM | POA: Diagnosis not present

## 2017-05-05 DIAGNOSIS — J209 Acute bronchitis, unspecified: Secondary | ICD-10-CM | POA: Diagnosis not present

## 2017-06-03 ENCOUNTER — Ambulatory Visit (INDEPENDENT_AMBULATORY_CARE_PROVIDER_SITE_OTHER): Payer: Medicare Other

## 2017-06-03 DIAGNOSIS — Z95810 Presence of automatic (implantable) cardiac defibrillator: Secondary | ICD-10-CM

## 2017-06-03 DIAGNOSIS — I5022 Chronic systolic (congestive) heart failure: Secondary | ICD-10-CM | POA: Diagnosis not present

## 2017-06-04 ENCOUNTER — Ambulatory Visit (INDEPENDENT_AMBULATORY_CARE_PROVIDER_SITE_OTHER): Payer: Medicare Other | Admitting: Pharmacist Clinician (PhC)/ Clinical Pharmacy Specialist

## 2017-06-04 DIAGNOSIS — N138 Other obstructive and reflux uropathy: Secondary | ICD-10-CM | POA: Diagnosis not present

## 2017-06-04 DIAGNOSIS — I482 Chronic atrial fibrillation, unspecified: Secondary | ICD-10-CM

## 2017-06-04 DIAGNOSIS — Z8042 Family history of malignant neoplasm of prostate: Secondary | ICD-10-CM | POA: Diagnosis not present

## 2017-06-04 DIAGNOSIS — R972 Elevated prostate specific antigen [PSA]: Secondary | ICD-10-CM | POA: Diagnosis not present

## 2017-06-04 DIAGNOSIS — I82A12 Acute embolism and thrombosis of left axillary vein: Secondary | ICD-10-CM | POA: Diagnosis not present

## 2017-06-04 DIAGNOSIS — Z7901 Long term (current) use of anticoagulants: Secondary | ICD-10-CM

## 2017-06-04 DIAGNOSIS — N401 Enlarged prostate with lower urinary tract symptoms: Secondary | ICD-10-CM | POA: Diagnosis not present

## 2017-06-04 DIAGNOSIS — I82622 Acute embolism and thrombosis of deep veins of left upper extremity: Secondary | ICD-10-CM | POA: Diagnosis not present

## 2017-06-04 LAB — POCT INR: INR: 2.1

## 2017-06-04 NOTE — Progress Notes (Signed)
EPIC Encounter for ICM Monitoring  Patient Name: Marc Potter is a 71 y.o. male Date: 06/04/2017 Primary Care Physican: Nonnie Done., MD Primary Cardiologist:Berry Electrophysiologist: Croitoru Dry Weight:Previous weight 185 lbs       Transmission received.   Thoracic impedance normal.  No diuretic  Recommendations: None.  Follow-up plan: ICM clinic phone appointment on 07/05/2017.   Copy of ICM check sent to Dr. Royann Shivers.   3 month ICM trend: 06/03/2017    1 Year ICM trend:       Karie Soda, RN 06/04/2017 1:09 PM

## 2017-06-21 DIAGNOSIS — Z79899 Other long term (current) drug therapy: Secondary | ICD-10-CM | POA: Diagnosis not present

## 2017-06-21 DIAGNOSIS — I10 Essential (primary) hypertension: Secondary | ICD-10-CM | POA: Diagnosis not present

## 2017-06-21 DIAGNOSIS — E039 Hypothyroidism, unspecified: Secondary | ICD-10-CM | POA: Diagnosis not present

## 2017-07-01 DIAGNOSIS — E038 Other specified hypothyroidism: Secondary | ICD-10-CM | POA: Diagnosis not present

## 2017-07-01 DIAGNOSIS — E7801 Familial hypercholesterolemia: Secondary | ICD-10-CM | POA: Diagnosis not present

## 2017-07-01 DIAGNOSIS — Z23 Encounter for immunization: Secondary | ICD-10-CM | POA: Diagnosis not present

## 2017-07-05 ENCOUNTER — Telehealth: Payer: Self-pay | Admitting: Cardiology

## 2017-07-05 NOTE — Telephone Encounter (Signed)
Spoke with pt and reminded pt of remote transmission that is due today. Pt verbalized understanding.   

## 2017-07-15 NOTE — Progress Notes (Signed)
No ICM remote transmission received for 07/05/2017 and next ICM transmission scheduled for 08/09/2017.

## 2017-07-16 ENCOUNTER — Ambulatory Visit (INDEPENDENT_AMBULATORY_CARE_PROVIDER_SITE_OTHER): Payer: Medicare Other | Admitting: Pharmacist

## 2017-07-16 DIAGNOSIS — Z7901 Long term (current) use of anticoagulants: Secondary | ICD-10-CM | POA: Diagnosis not present

## 2017-07-16 DIAGNOSIS — I82A12 Acute embolism and thrombosis of left axillary vein: Secondary | ICD-10-CM

## 2017-07-16 LAB — POCT INR: INR: 3

## 2017-07-19 ENCOUNTER — Telehealth: Payer: Self-pay | Admitting: Cardiovascular Disease

## 2017-07-19 DIAGNOSIS — Z1211 Encounter for screening for malignant neoplasm of colon: Secondary | ICD-10-CM | POA: Diagnosis not present

## 2017-07-19 NOTE — Telephone Encounter (Signed)
New Message      [facility] Pre-operative Risk Assessment    Request for surgical clearance:  1. What type of surgery is being performed? Colonoscopy    2. When is this surgery scheduled? TBD  3. What type of clearance is required (medical clearance vs. Pharmacy clearance to hold med vs. Both)? Pharmacy   4. Are there any medications that need to be held prior to surgery and how long? They need to know I f the coumadin needs to be held for how long or if the patient needs the injection  5. Practice name and name of physician performing surgery? [facility] Dr. Jerel Shepherd    What is your office phone and fax number? [phone] (office) [phone] (fax)  6. Anesthesia type (None, local, MAC, general) ? Does not know    Avaletta L Williams 07/19/2017, 11:31 AM  _________________________________________________________________   (provider comments below)

## 2017-07-20 NOTE — Telephone Encounter (Signed)
   Primary Cardiologist: [name], MD  Chart reviewed as part of pre-operative protocol coverage.   Request is for colonoscopy which is a low risk procedure.  I have left a message for the patient to call back so that he can be questioned regarding any changes in his symptoms since last seen.  I will also forward this note to the CVRR clinic to get input on holding Coumadin.  Tereso Newcomer, PA-C 07/20/2017, 4:10 PM

## 2017-07-21 NOTE — Telephone Encounter (Signed)
Patient with diagnosis of Atrial fibrillation on warfarin for anticoagulation.    Procedure: colonoscopy Date of procedure: TBD  CHADS2-VASc score of  5 (CHF, HTN, AGE, DVT) CHADS2 score = 2; VTE > 6 months ago  Per office protocol, patient can hold warfarin for 5 days prior to procedure.   Patient will not need bridging with Lovenox (enoxaparin) around procedure.  Patient should restart warfarin on the evening of procedure or day after, at discretion of procedure MD   Shepard Keltz Rodriguez-Guzman PharmD, BCPS, CPP [facility] [address] [place] [address] 07/21/2017 3:09 PM

## 2017-07-21 NOTE — Telephone Encounter (Signed)
See recommendation by our clinical pharmacist, no Lovenox bridge needed, hold coumadin for 5 days prior to procedure and restart after the procedure as soon as possible. Patient is cleared to proceed with colonoscopy.

## 2017-07-21 NOTE — Telephone Encounter (Signed)
   Primary Cardiologist: [name], MD  Chart reviewed as part of pre-operative protocol coverage. Patient was contacted 07/21/2017 in reference to pre-operative risk assessment for pending surgery as outlined below.  Prabhav Montgomery was last seen on 11/10/2016 by Dr. Allyson Sabal.  Since that day, Sudais Widmeyer has done well without chest pain. He has h/o NICM with clean coronaries on cath 2016, last echo showed improved EF in 2017. He denies any chest pain or SOB.   Therefore, based on ACC/AHA guidelines, the patient would be at acceptable risk for the planned procedure without further cardiovascular testing.   I will route this recommendation to the requesting party via Epic fax function and remove from pre-op pool.  Please call with questions.   Patient is cleared medically, however waiting for response from clinical pharmacist regarding how long to hold coumadin, will forward final clearance once hear back from our clinical pharmacist   Azalee Course, PA 07/21/2017, 2:47 PM

## 2017-07-21 NOTE — Telephone Encounter (Signed)
Note faxed manually and verified by secretary preop clearance was received.

## 2017-08-09 ENCOUNTER — Ambulatory Visit (INDEPENDENT_AMBULATORY_CARE_PROVIDER_SITE_OTHER): Payer: Medicare Other

## 2017-08-09 ENCOUNTER — Telehealth: Payer: Self-pay | Admitting: Cardiology

## 2017-08-09 DIAGNOSIS — I5022 Chronic systolic (congestive) heart failure: Secondary | ICD-10-CM

## 2017-08-09 DIAGNOSIS — Z95810 Presence of automatic (implantable) cardiac defibrillator: Secondary | ICD-10-CM | POA: Diagnosis not present

## 2017-08-09 NOTE — Telephone Encounter (Signed)
LMOVM reminding pt to send remote transmission.   

## 2017-08-12 NOTE — Progress Notes (Signed)
EPIC Encounter for ICM Monitoring  Patient Name: Marc Potter is a 72 y.o. male Date: 08/12/2017 Primary Care Physican: Nonnie Done., MD Primary Cardiologist:Berry Electrophysiologist: Croitoru Dry Weight:Previous weight 185 lbs      Attempted call to patient and unable to reach.  Left detailed message regarding transmission.  Transmission reviewed.    Thoracic impedance abnormal suggesting fluid accumulations starting 08/07/2016 until the day of report 08/10/2017.  No diuretic  Recommendations: Left voice mail with ICM number and encouraged to call if experiencing any fluid symptoms.  Follow-up plan: ICM clinic phone appointment on 08/19/2017 (manual send).  Office appointment scheduled 09/22/2017 with Dr. Royann Shivers.  Copy of ICM check sent to Dr. Royann Shivers and Dr. Allyson Sabal.   3 month ICM trend: 08/10/2017    1 Year ICM trend:       Karie Soda, RN 08/12/2017 3:10 PM

## 2017-08-12 NOTE — Progress Notes (Signed)
Patient returned call.  He said they were in [place] until yesterday and he ate in restaurants a lot.  Advised to cut back on salt intake.  He denied any symptoms.  Will recheck fluid levels on 08/19/2017.

## 2017-08-19 ENCOUNTER — Ambulatory Visit (INDEPENDENT_AMBULATORY_CARE_PROVIDER_SITE_OTHER): Payer: Self-pay

## 2017-08-19 DIAGNOSIS — N400 Enlarged prostate without lower urinary tract symptoms: Secondary | ICD-10-CM | POA: Diagnosis not present

## 2017-08-19 DIAGNOSIS — I5022 Chronic systolic (congestive) heart failure: Secondary | ICD-10-CM

## 2017-08-19 DIAGNOSIS — Z7901 Long term (current) use of anticoagulants: Secondary | ICD-10-CM | POA: Diagnosis not present

## 2017-08-19 DIAGNOSIS — Z95810 Presence of automatic (implantable) cardiac defibrillator: Secondary | ICD-10-CM | POA: Diagnosis not present

## 2017-08-19 DIAGNOSIS — I10 Essential (primary) hypertension: Secondary | ICD-10-CM | POA: Diagnosis not present

## 2017-08-19 DIAGNOSIS — Z87891 Personal history of nicotine dependence: Secondary | ICD-10-CM | POA: Diagnosis not present

## 2017-08-19 DIAGNOSIS — I428 Other cardiomyopathies: Secondary | ICD-10-CM | POA: Diagnosis not present

## 2017-08-19 DIAGNOSIS — Z1211 Encounter for screening for malignant neoplasm of colon: Secondary | ICD-10-CM | POA: Diagnosis not present

## 2017-08-19 DIAGNOSIS — I4891 Unspecified atrial fibrillation: Secondary | ICD-10-CM | POA: Diagnosis not present

## 2017-08-19 DIAGNOSIS — Z86718 Personal history of other venous thrombosis and embolism: Secondary | ICD-10-CM | POA: Diagnosis not present

## 2017-08-19 DIAGNOSIS — Z79899 Other long term (current) drug therapy: Secondary | ICD-10-CM | POA: Diagnosis not present

## 2017-08-19 DIAGNOSIS — E785 Hyperlipidemia, unspecified: Secondary | ICD-10-CM | POA: Diagnosis not present

## 2017-08-19 NOTE — Progress Notes (Signed)
EPIC Encounter for ICM Monitoring  Patient Name: Marc Potter is a 72 y.o. male Date: 08/19/2017 Primary Care Physican: Nonnie Done., MD Primary Cardiologist:Berry Electrophysiologist: Croitoru Dry Weight:Previous [id number] lbs      Heart Failure questions reviewed, pt asymptomatic.   Thoracic impedance returned to normal since last ICM transmission on 08/09/2017.  No diuretic  Recommendations: No changes.   Encouraged to call for fluid symptoms.  Follow-up plan: ICM clinic phone appointment on 09/13/2017.  Office appointment scheduled 09/22/2017 with Dr. Royann Shivers.  Copy of ICM check sent to Dr. Royann Shivers.   3 month ICM trend: 08/19/2017    1 Year ICM trend:             Karie Soda, RN 08/19/2017 12:37 PM

## 2017-08-19 NOTE — Progress Notes (Signed)
Thanks MCr 

## 2017-08-23 ENCOUNTER — Other Ambulatory Visit: Payer: Self-pay | Admitting: Cardiovascular Disease

## 2017-08-24 ENCOUNTER — Other Ambulatory Visit: Payer: Self-pay | Admitting: Cardiovascular Disease

## 2017-08-27 ENCOUNTER — Ambulatory Visit (INDEPENDENT_AMBULATORY_CARE_PROVIDER_SITE_OTHER): Payer: Medicare Other | Admitting: Pharmacist Clinician (PhC)/ Clinical Pharmacy Specialist

## 2017-08-27 DIAGNOSIS — I82A12 Acute embolism and thrombosis of left axillary vein: Secondary | ICD-10-CM

## 2017-08-27 DIAGNOSIS — Z7901 Long term (current) use of anticoagulants: Secondary | ICD-10-CM

## 2017-08-27 DIAGNOSIS — I82622 Acute embolism and thrombosis of deep veins of left upper extremity: Secondary | ICD-10-CM

## 2017-08-27 DIAGNOSIS — I482 Chronic atrial fibrillation, unspecified: Secondary | ICD-10-CM

## 2017-08-27 LAB — POCT INR: INR: 1.8

## 2017-09-08 DIAGNOSIS — E038 Other specified hypothyroidism: Secondary | ICD-10-CM | POA: Diagnosis not present

## 2017-09-08 DIAGNOSIS — Z79899 Other long term (current) drug therapy: Secondary | ICD-10-CM | POA: Diagnosis not present

## 2017-09-08 DIAGNOSIS — E7801 Familial hypercholesterolemia: Secondary | ICD-10-CM | POA: Diagnosis not present

## 2017-09-13 ENCOUNTER — Ambulatory Visit (INDEPENDENT_AMBULATORY_CARE_PROVIDER_SITE_OTHER): Payer: Medicare Other

## 2017-09-13 DIAGNOSIS — I5022 Chronic systolic (congestive) heart failure: Secondary | ICD-10-CM

## 2017-09-13 DIAGNOSIS — Z95810 Presence of automatic (implantable) cardiac defibrillator: Secondary | ICD-10-CM | POA: Diagnosis not present

## 2017-09-13 NOTE — Progress Notes (Signed)
EPIC Encounter for ICM Monitoring  Patient Name: Marc Potter is a 72 y.o. male Date: 09/13/2017 Primary Care Physican: Nonnie Done., MD Primary Cardiologist:Berry Electrophysiologist: Croitoru Dry Weight:Previous [id number] lbs       Attempted call to patient and unable to reach.  Left detailed message regarding transmission.  Transmission reviewed.    Thoracic impedance abnormal suggesting fluid accumulation starting 09/03/2017.  No diuretic  Recommendations: Left voice mail with ICM number and encouraged to call if experiencing any fluid symptoms.  Follow-up plan: ICM clinic phone appointment on 10/25/2017.  Office appointment scheduledfor defib check on 09/22/2017 with Dr.Croitoru.  Copy of ICM check sent to Dr. Royann Shivers and Dr. Allyson Sabal.   3 month ICM trend: 09/13/2017    1 Year ICM trend:       Karie Soda, RN 09/13/2017 4:54 PM

## 2017-09-14 ENCOUNTER — Telehealth: Payer: Self-pay

## 2017-09-14 NOTE — Progress Notes (Signed)
Thanks MCr 

## 2017-09-14 NOTE — Telephone Encounter (Signed)
Remote ICM transmission received.  Attempted call to patient and left detailed message per DPR regarding transmission and next ICM scheduled for 10/25/2017.  Advised to return call for any fluid symptoms or questions.    

## 2017-09-22 ENCOUNTER — Ambulatory Visit (INDEPENDENT_AMBULATORY_CARE_PROVIDER_SITE_OTHER): Payer: Medicare Other | Admitting: Cardiovascular Disease

## 2017-09-22 ENCOUNTER — Encounter: Payer: Self-pay | Admitting: Cardiovascular Disease

## 2017-09-22 ENCOUNTER — Ambulatory Visit (INDEPENDENT_AMBULATORY_CARE_PROVIDER_SITE_OTHER): Payer: Medicare Other | Admitting: Pharmacist Clinician (PhC)/ Clinical Pharmacy Specialist

## 2017-09-22 VITALS — BP 122/76 | HR 86 | Ht 72.0 in | Wt 198.8 lb

## 2017-09-22 DIAGNOSIS — I82A12 Acute embolism and thrombosis of left axillary vein: Secondary | ICD-10-CM

## 2017-09-22 DIAGNOSIS — I4891 Unspecified atrial fibrillation: Secondary | ICD-10-CM

## 2017-09-22 DIAGNOSIS — Z86718 Personal history of other venous thrombosis and embolism: Secondary | ICD-10-CM

## 2017-09-22 DIAGNOSIS — I5022 Chronic systolic (congestive) heart failure: Secondary | ICD-10-CM | POA: Diagnosis not present

## 2017-09-22 DIAGNOSIS — Z95810 Presence of automatic (implantable) cardiac defibrillator: Secondary | ICD-10-CM

## 2017-09-22 DIAGNOSIS — I428 Other cardiomyopathies: Secondary | ICD-10-CM

## 2017-09-22 LAB — POCT INR: INR: 2.9

## 2017-09-22 NOTE — Patient Instructions (Signed)
Dr Royann Shivers recommends that you continue on your current medications as directed. Please refer to the Current Medication list given to you today.  Remote monitoring is used to monitor your Pacemaker or ICD from home. This monitoring reduces the number of office visits required to check your device to one time per year. It allows [place] to keep an eye on the functioning of your device to ensure it is working properly. You are scheduled for a device check from home on Wednesday, July 10th, 2019. You may send your transmission at any time that day. If you have a wireless device, the transmission will be sent automatically. After your physician reviews your transmission, you will receive a notification with your next transmission date.  Dr Royann Shivers recommends that you schedule a follow-up appointment in 12 months with an ICD check. You will receive a reminder letter in the mail two months in advance. If you don't receive a letter, please call our office to schedule the follow-up appointment.  If you need a refill on your cardiac medications before your next appointment, please call your pharmacy.

## 2017-09-22 NOTE — Progress Notes (Signed)
Patient ID: Marc Potter, male   DOB: [birth year], 72 y.o.   MRN: [id number]    Cardiology Office Note    Date:  09/26/2017   ID:  Marc Potter, DOB 12/05/1945, MRN [id number]  PCP:  Enid Skeens., MD  Cardiologist: Quay Burow, M.D.;  Sanda Klein, MD   Chief Complaint  Patient presents with  . Pacemaker Check    History of Present Illness:  Marc Potter is a 72 y.o. male returning in follow-up after implantation of a defibrillator in December 2016 for primary prevention in the setting of nonischemic cardiomyopathy with severely depressed left ventricular systolic function and chronic atrial fibrillation.   He has not had any new serious health problems since we last met in the office.  He has gained some weight and is apologetic.  The patient specifically denies any chest pain at rest exertion, dyspnea at rest or with exertion, orthopnea, paroxysmal nocturnal dyspnea, syncope, palpitations, focal neurological deficits, intermittent claudication, lower extremity edema, unexplained weight gain, cough, hemoptysis or wheezing.  December 2017 he had left upper extremity DVT, possibly related to defibrillator Xarelto 2 had any bleeding problems or new thrombotic event  His single-chamber Medtronic Evera device is functioning normally, generator longevity is estimated at 9.3 years. He virtually never requires ventricular pacing. The device logs activity at around 3.5 hours per day on the average.  No evidence of recent fluid overload by optivol.  His device shows 1 8-second episode of nonsustained ventricular tachycardia was pretty fast at around 222 bpm that self terminated on August 23, 2017.  He did not have any symptoms related to it.  His heart failure is very well compensated even without diuretics.  His most recent echocardiogram in December 2017 shows improvement in left ventricular systolic function with EF around 45%  Past Medical History:  Diagnosis Date  . AICD  (automatic cardioverter/defibrillator) present 06/04/2015    Medtronic Evera XT VR model [id number] serial number [id number]  . Atrial fibrillation ([place])   . Heart failure with reduced ejection fraction, NYHA class II ([place])    a. EF 25-30% by echo in 02/2015  . Heart palpitations   . Hyperlipidemia   . Hypertension   . Left ventricular dysfunction   . Normal coronary arteries    by outpatient diagnostic coronary arteriography performed by Dr Gwenlyn Found 12/24/14.    Past Surgical History:  Procedure Laterality Date  . CARDIAC CATHETERIZATION N/A 12/24/2014   Procedure: Left Heart Cath and Coronary Angiography;  Surgeon: Lorretta Harp, MD;  Location: [facility];  Service: Cardiovascular;  Laterality: N/A;  . DOPPLER ECHOCARDIOGRAPHY  2012  . EP IMPLANTABLE DEVICE N/A 06/04/2015   Procedure: ICD Implant;  Surgeon: Sanda Klein, MD; Medtronic [place] VR model [id number] serial number [id number]; Laterality: N/A;  . NM MYOVIEW LTD  2012    Outpatient Medications Prior to Visit  Medication Sig Dispense Refill  . carvedilol (COREG) 25 MG tablet TAKE ONE TABLET BY MOUTH TWICE DAILY 180 tablet 3  . Coenzyme Q10 (CO Q-10) 100 MG CAPS Take 1 tablet by mouth daily.    [name] glucosamine-chondroitin 500-400 MG tablet Take 1 tablet by mouth 2 (two) times daily.    [name] lisinopril (PRINIVIL,ZESTRIL) 10 MG tablet TAKE ONE TABLET BY MOUTH ONCE DAILY 90 tablet 3  . Multiple Vitamin (MULTIVITAMIN) tablet Take 1 tablet by mouth daily.    . multivitamin-lutein (OCUVITE-LUTEIN) CAPS capsule Take 1 capsule by mouth daily.    [name] thyroid (ARMOUR) 90 MG tablet  Take 90 mg by mouth daily.    [name] warfarin (COUMADIN) 5 MG tablet TAKE 1 TO 1 AND 1/2 TABLETS BY MOUTH DAILY AS DIRECTED BY COUMADIN CLINIC 135 tablet 0  . atorvastatin (LIPITOR) 10 MG tablet Take 10 mg by mouth at bedtime.    [name] warfarin (COUMADIN) 5 MG tablet TAKE 1 TO 1 AND 1/2 TABLETS BY MOUTH DAILY AS DIRECTED BY COUMADIN CLINIC 135 tablet 0   No  facility-administered medications prior to visit.      Allergies:   Patient has no known allergies.   Social History   Socioeconomic History  . Marital status: Married    Spouse name: Not on file  . Number of children: Not on file  . Years of education: Not on file  . Highest education level: Not on file  Occupational History  . Not on file  Social Needs  . Financial resource strain: Not on file  . Food insecurity:    Worry: Not on file    Inability: Not on file  . Transportation needs:    Medical: Not on file    Non-medical: Not on file  Tobacco Use  . Smoking status: Former Smoker    Last attempt to quit: 06/15/1993    Years since quitting: 24.2  . Smokeless tobacco: Never Used  Substance and Sexual Activity  . Alcohol use: Yes    Comment: DRINKS ON OCCASION   . Drug use: No  . Sexual activity: Not on file  Lifestyle  . Physical activity:    Days per week: Not on file    Minutes per session: Not on file  . Stress: Not on file  Relationships  . Social connections:    Talks on phone: Not on file    Gets together: Not on file    Attends religious service: Not on file    Active member of club or organization: Not on file    Attends meetings of clubs or organizations: Not on file    Relationship status: Not on file  Other Topics Concern  . Not on file  Social History Narrative  . Not on file     Family History:  The patient's family history includes Cancer in his father; Diabetes in his mother; Heart failure in his father; Hypertension in his mother.   ROS:   Please see the history of present illness.    ROS All other systems reviewed and are negative.   PHYSICAL EXAM:   VS:  BP 122/76   Pulse 86   Ht 6' (1.829 m)   Wt 198 lb 12.8 oz (90.2 kg)   BMI 26.96 kg/m     General: Alert, oriented x3, no distress, overweight Head: no evidence of trauma, PERRL, EOMI, no exophtalmos or lid lag, no myxedema, no xanthelasma; normal ears, nose and oropharynx Neck:  normal jugular venous pulsations and no hepatojugular reflux; brisk carotid pulses without delay and no carotid bruits Chest: clear to auscultation, no signs of consolidation by percussion or palpation, normal fremitus, symmetrical and full respiratory excursions Cardiovascular: normal position and quality of the apical impulse, regular rhythm, normal first and second heart sounds, no murmurs, rubs or gallops.  Healthy left subclavian defibrillator site.  No evidence of swelling in the left upper extremity.  No clear evidence of collateral vein formation. Abdomen: no tenderness or distention, no masses by palpation, no abnormal pulsatility or arterial bruits, normal bowel sounds, no hepatosplenomegaly Extremities: no clubbing, cyanosis or edema; 2+ radial, ulnar and  brachial pulses bilaterally; 2+ right femoral, posterior tibial and dorsalis pedis pulses; 2+ left femoral, posterior tibial and dorsalis pedis pulses; no subclavian or femoral bruits Neurological: grossly nonfocal Psych: Normal mood and affect   Wt Readings from Last 3 Encounters:  09/22/17 198 lb 12.8 oz (90.2 kg)  11/10/16 197 lb (89.4 kg)  07/22/16 191 lb 12.8 oz (87 kg)      Studies/Labs Reviewed:   EKG:  EKG is ordered today.  Shows atrial fibrillation at 86 bpm.  The QRS is slightly prolonged at 106 ms and almost meets criteria for left anterior fascicular block.  QTC borderline long at 459 ms  Recent Labs: September 08, 2017 labs from PCP Creatinine 0.96, potassium 4.5, glucose 118, normal liver function Total cholesterol 152, triglycerides 216, LDL 66, HDL 43 Lipid Panel    Component Value Date/Time   CHOL 164 12/18/2014 0959   TRIG 209 (H) 12/18/2014 0959   HDL 43 12/18/2014 0959   CHOLHDL 3.8 12/18/2014 0959   VLDL 42 (H) 12/18/2014 0959   LDLCALC 79 12/18/2014 0959     ASSESSMENT:    1. Chronic systolic heart failure ([place])   2. Non-ischemic cardiomyopathy ([place])   3. Atrial fibrillation, unspecified type  ([place])   4. ICD (implantable cardioverter-defibrillator) in place   5. History of DVT (deep vein thrombosis)      PLAN:  In order of problems listed above:  1. CHF: Clinically compensated, euvolemic without diuretics, functional class I-2.  EF has improved and he does not meet criteria to start Entresto. 2. AFib: Appropriately rate controlled and anticoagulated.  Permanent arrhythmia.  Asymptomatic. 3. ICD: Normal device function.  Reviewed the need for every 3 months remote downloads and reviewed his "shock plan". 4. 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  No clear evidence of venous collateral formation to suggest plate and vein occlusion.   Medication Adjustments/Labs and Tests Ordered: Current medicines are reviewed at length with the patient today.  Concerns regarding medicines are outlined above.  Medication changes, Labs and Tests ordered today are listed in the Patient Instructions below. Patient Instructions  Dr Sallyanne Kuster recommends that you continue on your current medications as directed. Please refer to the Current Medication list given to you today.  Remote monitoring is used to monitor your Pacemaker or ICD from home. This monitoring reduces the number of office visits required to check your device to one time per year. It allows [place] to keep an eye on the functioning of your device to ensure it is working properly. You are scheduled for a device check from home on Wednesday, July 10th, 2019. You may send your transmission at any time that day. If you have a wireless device, the transmission will be sent automatically. After your physician reviews your transmission, you will receive a notification with your next transmission date.  Dr Sallyanne Kuster recommends that you schedule a follow-up appointment in 12 months with an ICD check. You will receive a reminder letter in the mail two months in advance. If you don't receive a letter, please  call our office to schedule the follow-up appointment.  If you need a refill on your cardiac medications before your next appointment, please call your pharmacy.      Signed, Sanda Klein, MD  09/26/2017 4:53 PM    [facility] [place], [place], [place]  [address] Phone: [phone]; Fax: [phone]

## 2017-09-23 IMAGING — CR DG HAND COMPLETE 3+V*L*
3 series · 3 of 3 positions shown · non-contrast
Comparison: None.

CLINICAL DATA: Left forearm and hand pain and swelling. No known
injury. Initial encounter.

EXAM:
LEFT HAND - COMPLETE 3+ VIEW

[hand pa]
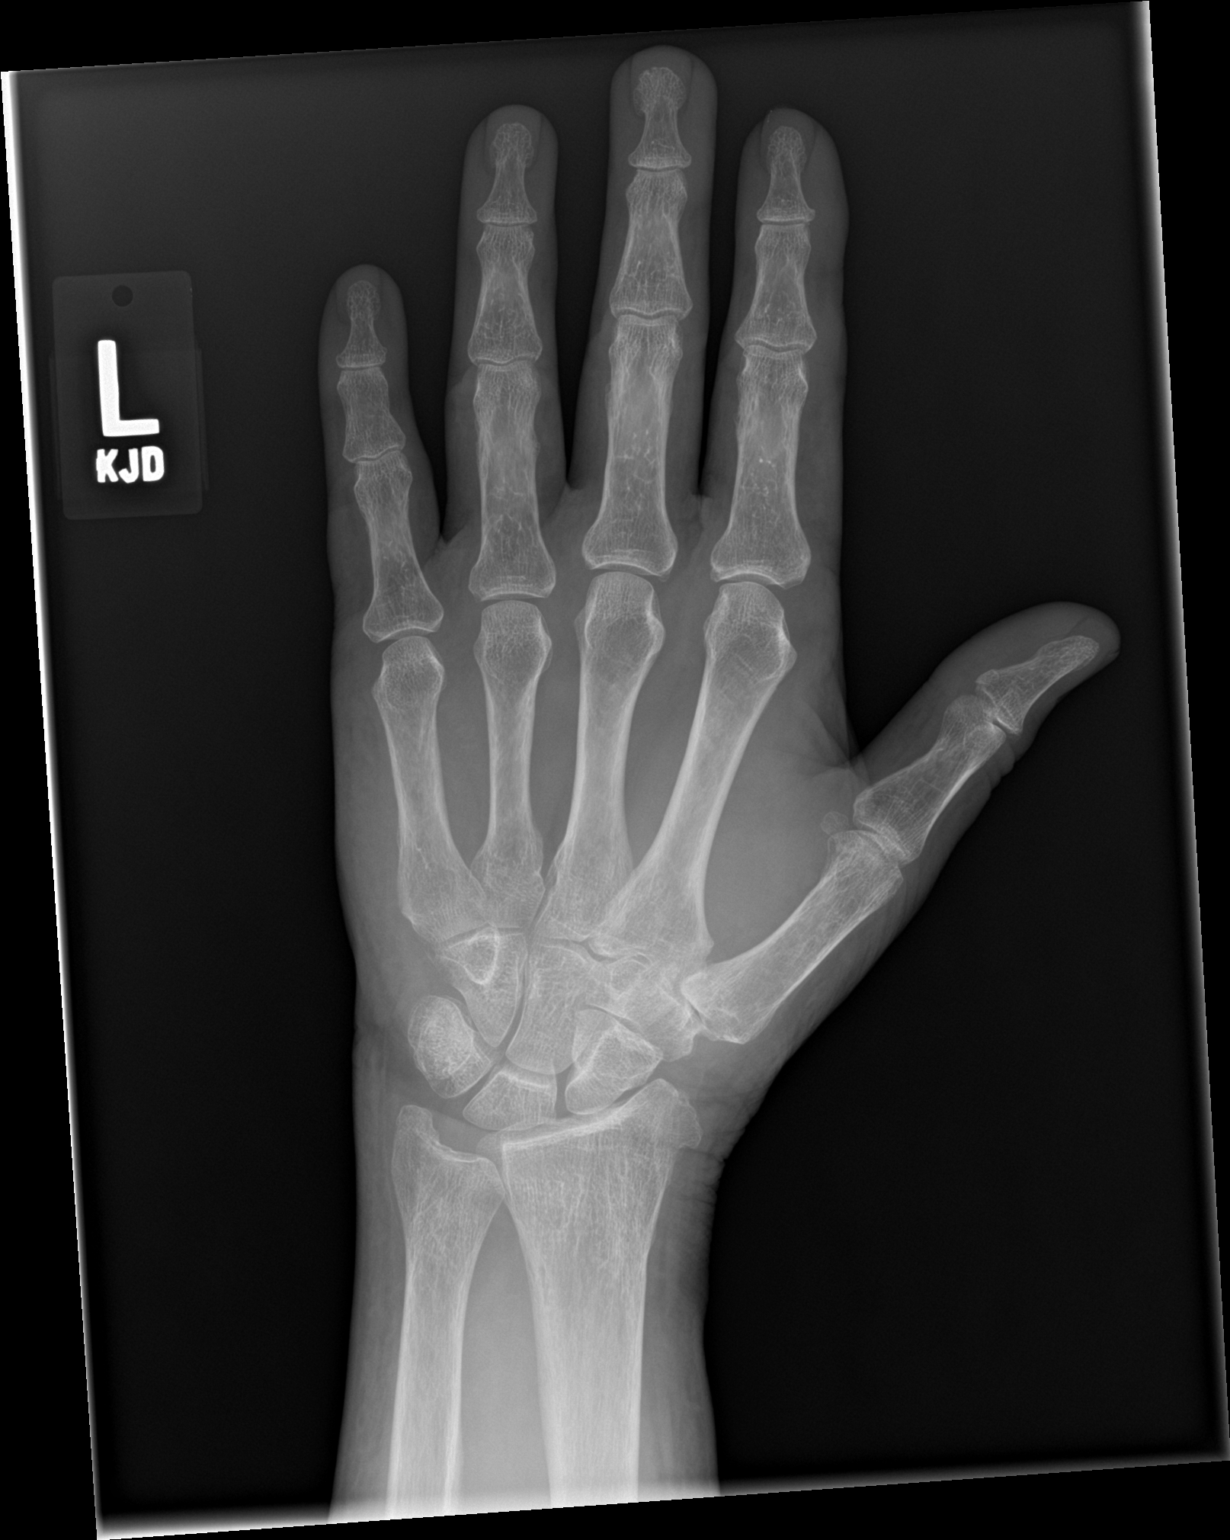

[hand obl]
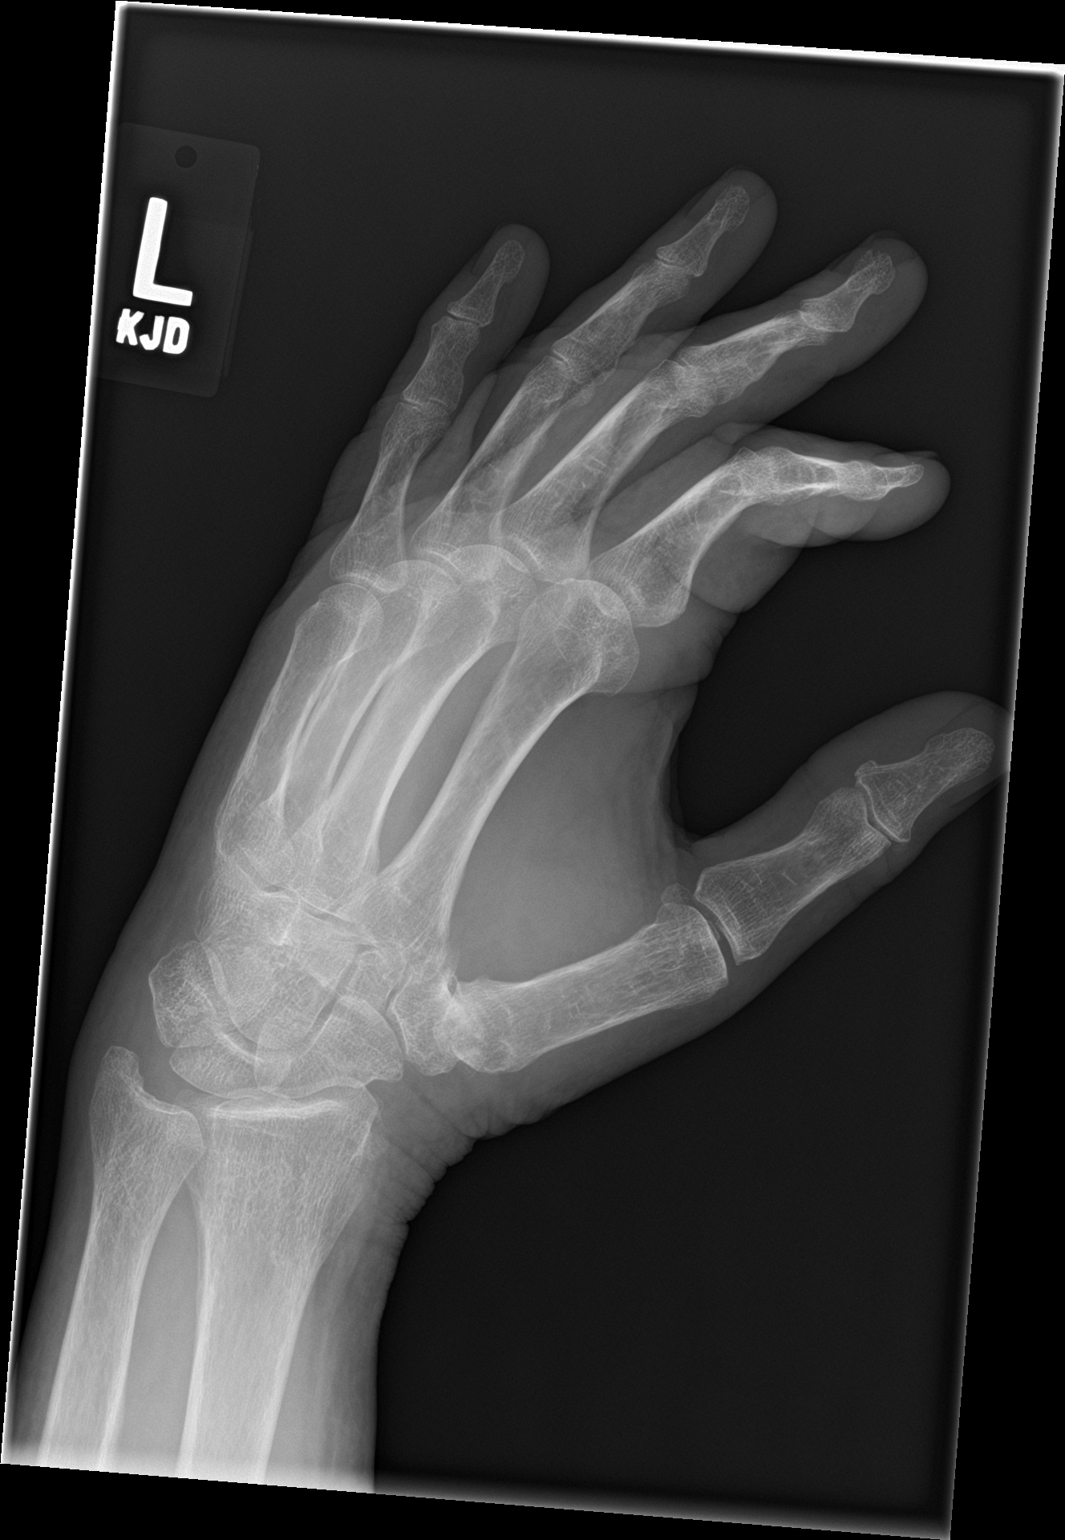

[hand lat]
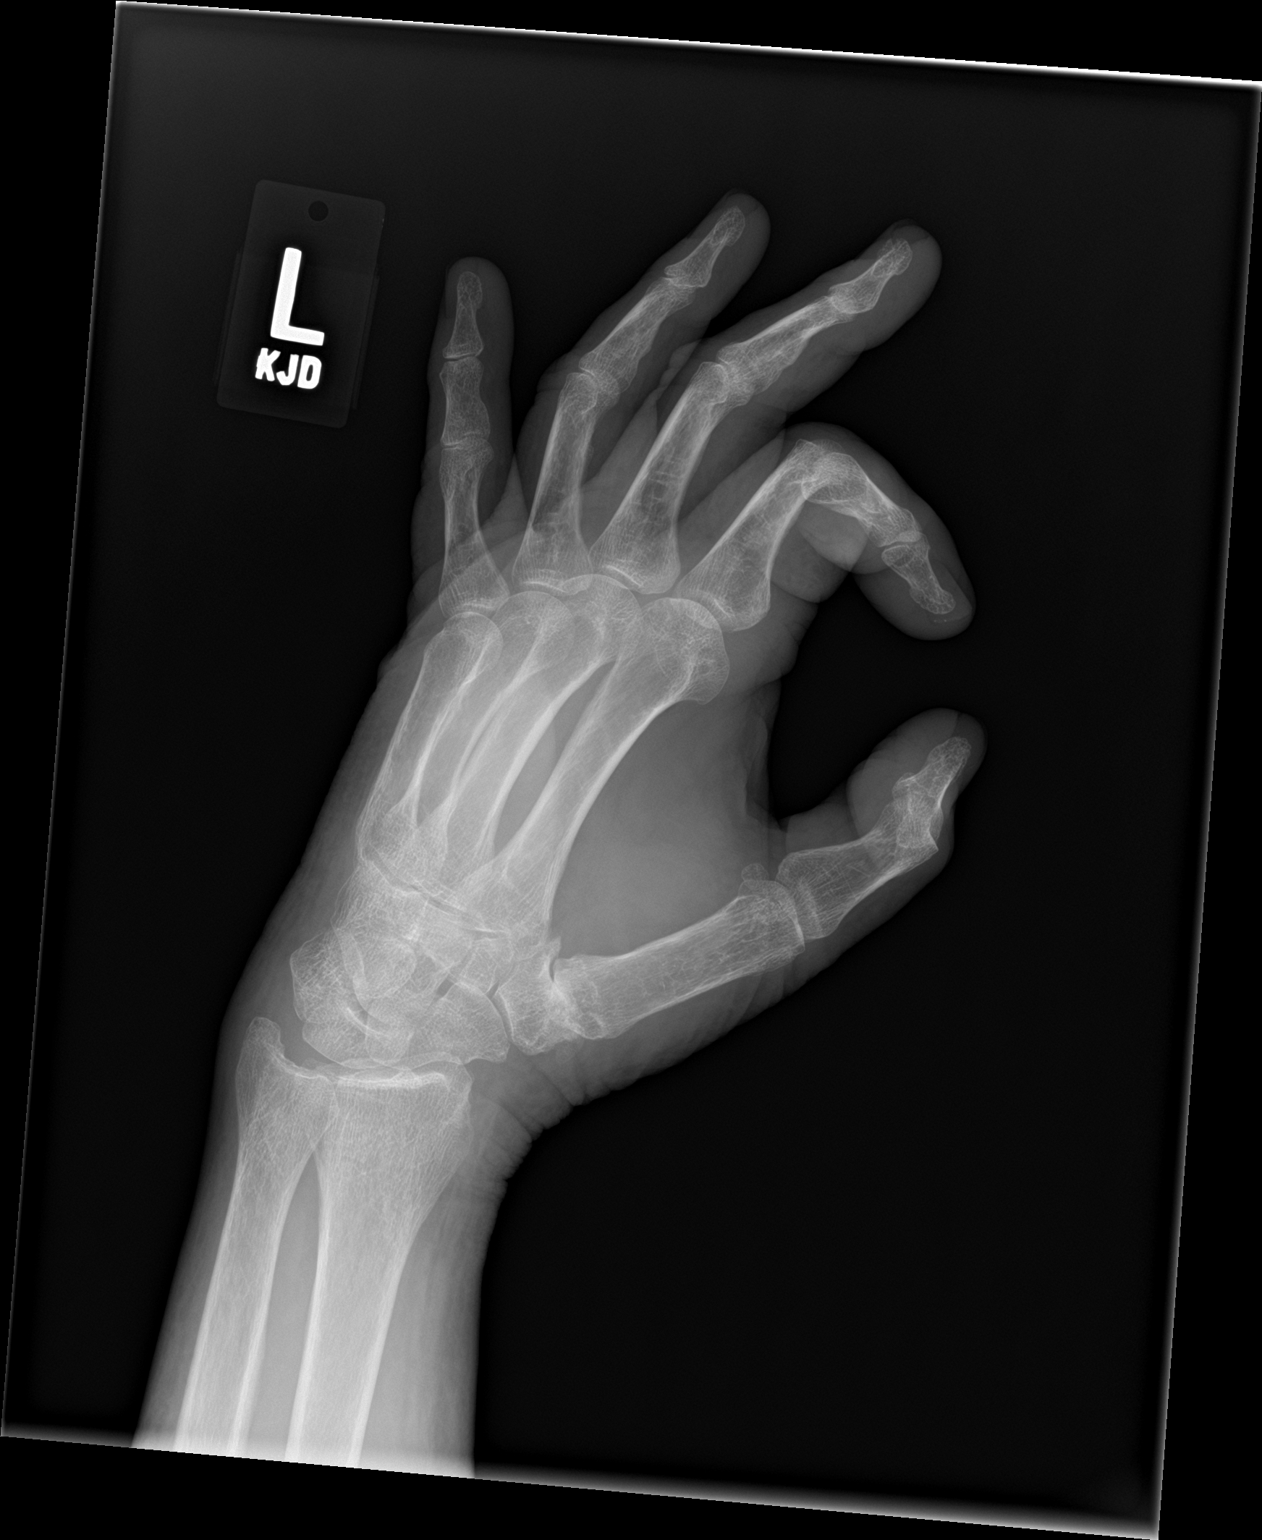

[3 of 3 positions shown; findings below may reference images not displayed]

FINDINGS: No acute bony or joint abnormality is identified. Bones are somewhat
osteopenic. First CMC osteoarthritis is seen. Soft tissues are
unremarkable.
IMPRESSION: No acute abnormality.

## 2017-09-26 ENCOUNTER — Encounter: Payer: Self-pay | Admitting: Cardiovascular Disease

## 2017-10-02 ENCOUNTER — Telehealth: Payer: Self-pay | Admitting: Physician Assistant

## 2017-10-02 DIAGNOSIS — H1030 Unspecified acute conjunctivitis, unspecified eye: Secondary | ICD-10-CM | POA: Diagnosis not present

## 2017-10-02 NOTE — Telephone Encounter (Signed)
Pt's wife called stating he has a "blood spot" on his eye that is spreading. His last INR check was last week and his INR was at acceptable range. He is not having changes in vision, blurry vision, or vision loss. I advised her that I couldn't evaluate him over the phone and that if it is spreading or he has changes in vision, he should be evaluated in the ER. She stated they will watch it for now. I reiterated that he should be evaluated in the ER. She expressed understanding of the plan.

## 2017-10-22 ENCOUNTER — Ambulatory Visit (INDEPENDENT_AMBULATORY_CARE_PROVIDER_SITE_OTHER): Payer: Medicare Other | Admitting: Pharmacist

## 2017-10-22 DIAGNOSIS — Z7901 Long term (current) use of anticoagulants: Secondary | ICD-10-CM

## 2017-10-22 DIAGNOSIS — I82A12 Acute embolism and thrombosis of left axillary vein: Secondary | ICD-10-CM

## 2017-10-22 LAB — POCT INR: INR: 3.5

## 2017-10-25 ENCOUNTER — Telehealth: Payer: Self-pay

## 2017-10-25 ENCOUNTER — Ambulatory Visit (INDEPENDENT_AMBULATORY_CARE_PROVIDER_SITE_OTHER): Payer: Medicare Other

## 2017-10-25 DIAGNOSIS — I5022 Chronic systolic (congestive) heart failure: Secondary | ICD-10-CM | POA: Diagnosis not present

## 2017-10-25 DIAGNOSIS — Z95810 Presence of automatic (implantable) cardiac defibrillator: Secondary | ICD-10-CM

## 2017-10-25 NOTE — Telephone Encounter (Signed)
Spoke with pt and reminded pt of remote transmission that is due today. Pt verbalized understanding.   

## 2017-10-26 ENCOUNTER — Telehealth: Payer: Self-pay

## 2017-10-26 NOTE — Progress Notes (Signed)
EPIC Encounter for ICM Monitoring  Patient Name: Marc Potter is a 72 y.o. male Date: 10/26/2017 Primary Care Physican: Nonnie Done., MD Primary Cardiologist:Berry Electrophysiologist: Croitoru Dry Weight:188 lbs      Heart Failure questions reviewed, pt asymptomatic.  He has been eating in restaurants due to circumstances of death of his father in law.    Thoracic impedance abnormal suggesting fluid accumulation since 10/19/2017.  Prescribed dosage: No diuretic  Recommendations: Reinforced fluid restriction to < 2 L daily and sodium restriction to less than 2000 mg daily.  Encouraged to call for fluid symptoms.  Follow-up plan: ICM clinic phone appointment on 11/04/2017 (manual send) to recheck fluid levels.    Copy of ICM check sent to Dr. Royann Shivers and Dr. Allyson Sabal.   3 month ICM trend: 10/26/2017    1 Year ICM trend:       Karie Soda, RN 10/26/2017 9:45 AM

## 2017-10-26 NOTE — Telephone Encounter (Signed)
Remote ICM transmission received.  Attempted call to patient and left detailed message, per DPR, regarding transmission and next ICM scheduled for 11/04/2017.  Advised to return call for any fluid symptoms or questions.

## 2017-10-26 NOTE — Progress Notes (Signed)
Thanks MCr 

## 2017-11-04 ENCOUNTER — Ambulatory Visit (INDEPENDENT_AMBULATORY_CARE_PROVIDER_SITE_OTHER): Payer: Self-pay

## 2017-11-04 DIAGNOSIS — Z95810 Presence of automatic (implantable) cardiac defibrillator: Secondary | ICD-10-CM

## 2017-11-04 DIAGNOSIS — I5022 Chronic systolic (congestive) heart failure: Secondary | ICD-10-CM

## 2017-11-05 NOTE — Progress Notes (Signed)
EPIC Encounter for ICM Monitoring  Patient Name: Marc Potter is a 72 y.o. male Date: 11/05/2017 Primary Care Physican: Nonnie Done., MD Primary Cardiologist:Berry Electrophysiologist: Croitoru Dry Weight:Previous weight 188 lbs      Attempted call to patient and unable to reach.  Left detailed message, per DPR, regarding transmission.  Transmission reviewed.    Thoracic impedance returned to normal since last transmission on 10/25/2017.   No diuretic  Recommendations: Left voice mail with ICM number and encouraged to call if experiencing any fluid symptoms.  Follow-up plan: ICM clinic phone appointment on 11/29/2017.    Copy of ICM check sent to Dr. Royann Shivers.   3 month ICM trend: 11/04/2017    1 Year ICM trend:       Karie Soda, RN 11/05/2017 10:02 AM

## 2017-11-06 NOTE — Progress Notes (Signed)
Thanks

## 2017-11-12 ENCOUNTER — Ambulatory Visit (INDEPENDENT_AMBULATORY_CARE_PROVIDER_SITE_OTHER): Payer: Medicare Other | Admitting: Pharmacist Clinician (PhC)/ Clinical Pharmacy Specialist

## 2017-11-12 DIAGNOSIS — Z7901 Long term (current) use of anticoagulants: Secondary | ICD-10-CM | POA: Diagnosis not present

## 2017-11-12 DIAGNOSIS — I482 Chronic atrial fibrillation, unspecified: Secondary | ICD-10-CM

## 2017-11-12 DIAGNOSIS — I82A12 Acute embolism and thrombosis of left axillary vein: Secondary | ICD-10-CM

## 2017-11-12 LAB — POCT INR: INR: 3 (ref 2.0–3.0)

## 2017-11-22 DIAGNOSIS — J069 Acute upper respiratory infection, unspecified: Secondary | ICD-10-CM | POA: Diagnosis not present

## 2017-11-29 ENCOUNTER — Ambulatory Visit (INDEPENDENT_AMBULATORY_CARE_PROVIDER_SITE_OTHER): Payer: Medicare Other

## 2017-11-29 DIAGNOSIS — Z95810 Presence of automatic (implantable) cardiac defibrillator: Secondary | ICD-10-CM

## 2017-11-29 DIAGNOSIS — I5022 Chronic systolic (congestive) heart failure: Secondary | ICD-10-CM

## 2017-11-30 NOTE — Progress Notes (Signed)
EPIC Encounter for ICM Monitoring  Patient Name: Marc Potter is a 72 y.o. male Date: 11/30/2017 Primary Care Physican: Nonnie Done., MD Primary Cardiologist:Berry Electrophysiologist: Croitoru Dry Weight:188lbs       Heart Failure questions reviewed, pt asymptomatic.   Thoracic impedance normal.  No diuretic  Recommendations: No changes.   Encouraged to call for fluid symptoms.  Follow-up plan: ICM clinic phone appointment on 12/30/2017.    Copy of ICM check sent to Dr. Royann Shivers.   3 month ICM trend: 11/29/2017    1 Year ICM trend:       Karie Soda, RN 11/30/2017 11:12 AM

## 2017-12-03 DIAGNOSIS — Z8042 Family history of malignant neoplasm of prostate: Secondary | ICD-10-CM | POA: Diagnosis not present

## 2017-12-03 DIAGNOSIS — N138 Other obstructive and reflux uropathy: Secondary | ICD-10-CM | POA: Diagnosis not present

## 2017-12-03 DIAGNOSIS — R972 Elevated prostate specific antigen [PSA]: Secondary | ICD-10-CM | POA: Diagnosis not present

## 2017-12-03 DIAGNOSIS — N401 Enlarged prostate with lower urinary tract symptoms: Secondary | ICD-10-CM | POA: Diagnosis not present

## 2017-12-09 DIAGNOSIS — H5203 Hypermetropia, bilateral: Secondary | ICD-10-CM | POA: Diagnosis not present

## 2017-12-09 DIAGNOSIS — H353132 Nonexudative age-related macular degeneration, bilateral, intermediate dry stage: Secondary | ICD-10-CM | POA: Diagnosis not present

## 2017-12-09 DIAGNOSIS — H2513 Age-related nuclear cataract, bilateral: Secondary | ICD-10-CM | POA: Diagnosis not present

## 2017-12-10 ENCOUNTER — Ambulatory Visit (INDEPENDENT_AMBULATORY_CARE_PROVIDER_SITE_OTHER): Payer: Medicare Other | Admitting: Pharmacist Clinician (PhC)/ Clinical Pharmacy Specialist

## 2017-12-10 DIAGNOSIS — Z7901 Long term (current) use of anticoagulants: Secondary | ICD-10-CM

## 2017-12-10 DIAGNOSIS — I482 Chronic atrial fibrillation, unspecified: Secondary | ICD-10-CM

## 2017-12-10 DIAGNOSIS — I82A12 Acute embolism and thrombosis of left axillary vein: Secondary | ICD-10-CM | POA: Diagnosis not present

## 2017-12-10 LAB — POCT INR: INR: 3 (ref 2.0–3.0)

## 2017-12-17 ENCOUNTER — Other Ambulatory Visit: Payer: Self-pay | Admitting: Cardiovascular Disease

## 2017-12-17 LAB — CUP PACEART INCLINIC DEVICE CHECK
Implantable Lead Implant Date: 20161220
Implantable Lead Location: 753860
Implantable Pulse Generator Implant Date: 20161220
Lead Channel Setting Pacing Pulse Width: 0.4 ms
Lead Channel Setting Sensing Sensitivity: 0.3 mV
MDC IDC SESS DTM: 20190705145611
MDC IDC SET LEADCHNL RV PACING AMPLITUDE: 2.5 V

## 2017-12-20 DIAGNOSIS — E039 Hypothyroidism, unspecified: Secondary | ICD-10-CM | POA: Diagnosis not present

## 2017-12-30 ENCOUNTER — Ambulatory Visit (INDEPENDENT_AMBULATORY_CARE_PROVIDER_SITE_OTHER): Payer: Medicare Other

## 2017-12-30 ENCOUNTER — Ambulatory Visit (INDEPENDENT_AMBULATORY_CARE_PROVIDER_SITE_OTHER): Payer: Medicare Other | Admitting: *Deleted

## 2017-12-30 DIAGNOSIS — I428 Other cardiomyopathies: Secondary | ICD-10-CM

## 2017-12-30 DIAGNOSIS — I5022 Chronic systolic (congestive) heart failure: Secondary | ICD-10-CM | POA: Diagnosis not present

## 2017-12-30 DIAGNOSIS — Z95810 Presence of automatic (implantable) cardiac defibrillator: Secondary | ICD-10-CM | POA: Diagnosis not present

## 2017-12-30 NOTE — Progress Notes (Signed)
EPIC Encounter for ICM Monitoring  Patient Name: Marc Potter is a 72 y.o. male Date: 12/30/2017 Primary Care Physican: Nonnie Done., MD Primary Cardiologist:Berry Electrophysiologist: Croitoru Dry Weight: 188lbs       Heart Failure questions reviewed, pt asymptomatic.  He is feeling fine.     Thoracic impedance normal.  No diuretic  Recommendations: No changes.  Encouraged to call for fluid symptoms.  Follow-up plan: ICM clinic phone appointment on 01/31/2018.    Copy of ICM check sent to Dr. Royann Shivers.   3 month ICM trend: 12/30/2017    1 Year ICM trend:       Karie Soda, RN 12/30/2017 11:30 AM

## 2017-12-30 NOTE — Progress Notes (Signed)
Remote ICD transmission.   

## 2017-12-31 ENCOUNTER — Encounter: Payer: Self-pay | Admitting: Cardiology

## 2018-01-06 LAB — CUP PACEART REMOTE DEVICE CHECK
Battery Remaining Longevity: 107 mo
Date Time Interrogation Session: 20190718083723
HighPow Impedance: 77 Ohm
Implantable Lead Implant Date: 20161220
Implantable Pulse Generator Implant Date: 20161220
Lead Channel Impedance Value: 380 Ohm
Lead Channel Pacing Threshold Amplitude: 0.625 V
MDC IDC LEAD LOCATION: 753860
MDC IDC MSMT BATTERY VOLTAGE: 2.99 V
MDC IDC MSMT LEADCHNL RV IMPEDANCE VALUE: 437 Ohm
MDC IDC MSMT LEADCHNL RV PACING THRESHOLD PULSEWIDTH: 0.4 ms
MDC IDC MSMT LEADCHNL RV SENSING INTR AMPL: 13.125 mV
MDC IDC MSMT LEADCHNL RV SENSING INTR AMPL: 13.125 mV
MDC IDC SET LEADCHNL RV PACING AMPLITUDE: 2.5 V
MDC IDC SET LEADCHNL RV PACING PULSEWIDTH: 0.4 ms
MDC IDC SET LEADCHNL RV SENSING SENSITIVITY: 0.3 mV
MDC IDC STAT BRADY RV PERCENT PACED: 0.06 %

## 2018-01-07 ENCOUNTER — Ambulatory Visit (INDEPENDENT_AMBULATORY_CARE_PROVIDER_SITE_OTHER): Payer: Medicare Other | Admitting: Pharmacist

## 2018-01-07 DIAGNOSIS — I82A12 Acute embolism and thrombosis of left axillary vein: Secondary | ICD-10-CM | POA: Diagnosis not present

## 2018-01-07 DIAGNOSIS — Z7901 Long term (current) use of anticoagulants: Secondary | ICD-10-CM | POA: Diagnosis not present

## 2018-01-07 LAB — POCT INR: INR: 3.3 — AB (ref 2.0–3.0)

## 2018-01-18 DIAGNOSIS — R972 Elevated prostate specific antigen [PSA]: Secondary | ICD-10-CM | POA: Diagnosis not present

## 2018-01-31 ENCOUNTER — Ambulatory Visit (INDEPENDENT_AMBULATORY_CARE_PROVIDER_SITE_OTHER): Payer: Medicare Other

## 2018-01-31 DIAGNOSIS — Z95810 Presence of automatic (implantable) cardiac defibrillator: Secondary | ICD-10-CM | POA: Diagnosis not present

## 2018-01-31 DIAGNOSIS — I5022 Chronic systolic (congestive) heart failure: Secondary | ICD-10-CM

## 2018-01-31 NOTE — Progress Notes (Signed)
EPIC Encounter for ICM Monitoring  Patient Name: Marc Potter is a 72 y.o. male Date: 01/31/2018 Primary Care Physican: Nonnie Done., MD Primary Cardiologist:Berry Electrophysiologist: Croitoru Dry Weight: 188lbs      Heart Failure questions reviewed, pt reported weight increased to 190 lbs in the last week but is back to 188 lbs today.  He was eating at restaurants a lot in the last couple of weeks due to having work done on the house.    Thoracic impedance abnormal suggesting fluid accumulation starting 01/22/2018 and trending back toward baseline.  Nodiuretic  Recommendations: No changes.  Reinforced sodium restriction to less than 2000 mg daily.  Encouraged to call for fluid symptoms.  Follow-up plan: ICM clinic phone appointment on 02/07/2018 to recheck fluid levels.       Copy of ICM check sent to Dr. Royann Shivers.   3 month ICM trend: 01/31/2018    1 Year ICM trend:       Karie Soda, RN 01/31/2018 12:19 PM

## 2018-02-01 NOTE — Progress Notes (Signed)
Thanks MCr 

## 2018-02-07 ENCOUNTER — Telehealth: Payer: Self-pay

## 2018-02-07 ENCOUNTER — Ambulatory Visit (INDEPENDENT_AMBULATORY_CARE_PROVIDER_SITE_OTHER): Payer: Medicare Other

## 2018-02-07 DIAGNOSIS — Z95810 Presence of automatic (implantable) cardiac defibrillator: Secondary | ICD-10-CM

## 2018-02-07 DIAGNOSIS — I5022 Chronic systolic (congestive) heart failure: Secondary | ICD-10-CM

## 2018-02-07 NOTE — Telephone Encounter (Signed)
Remote ICM transmission received.  Attempted call to patient and left detailed message, per DPR, regarding transmission and next ICM scheduled for 03/10/2018.  Advised to return call for any fluid symptoms or questions.   

## 2018-02-07 NOTE — Progress Notes (Signed)
EPIC Encounter for ICM Monitoring  Patient Name: Marc Potter is a 72 y.o. male Date: 02/07/2018 Primary Care Physican: Nonnie Done., MD Primary Cardiologist:Berry Electrophysiologist: Croitoru Dry Weight:Previous weight 188lbs      Attempted call to patient and unable to reach.  Left detailed message, per DPR, regarding transmission.  Transmission reviewed.    Thoracic impedance returned to normal since last ICM remote transmission on 01/31/2018.   Nodiuretic  Recommendations: Left voice mail with ICM number and encouraged to call if experiencing any fluid symptoms.  Follow-up plan: ICM clinic phone appointment on 03/10/2018.    Copy of ICM check sent to Dr. Royann Shivers.   3 month ICM trend: 02/07/2018    1 Year ICM trend:       Marc Soda, RN 02/07/2018 12:49 PM

## 2018-02-08 ENCOUNTER — Ambulatory Visit (INDEPENDENT_AMBULATORY_CARE_PROVIDER_SITE_OTHER): Payer: Medicare Other | Admitting: Pharmacist

## 2018-02-08 DIAGNOSIS — Z7901 Long term (current) use of anticoagulants: Secondary | ICD-10-CM | POA: Diagnosis not present

## 2018-02-08 DIAGNOSIS — I82A12 Acute embolism and thrombosis of left axillary vein: Secondary | ICD-10-CM | POA: Diagnosis not present

## 2018-02-08 LAB — POCT INR: INR: 2.7 (ref 2.0–3.0)

## 2018-02-17 DIAGNOSIS — L03311 Cellulitis of abdominal wall: Secondary | ICD-10-CM | POA: Diagnosis not present

## 2018-02-17 DIAGNOSIS — Z6826 Body mass index (BMI) 26.0-26.9, adult: Secondary | ICD-10-CM | POA: Diagnosis not present

## 2018-02-17 DIAGNOSIS — Z6829 Body mass index (BMI) 29.0-29.9, adult: Secondary | ICD-10-CM | POA: Diagnosis not present

## 2018-02-17 DIAGNOSIS — Z95810 Presence of automatic (implantable) cardiac defibrillator: Secondary | ICD-10-CM | POA: Diagnosis not present

## 2018-02-19 DIAGNOSIS — L0390 Cellulitis, unspecified: Secondary | ICD-10-CM | POA: Diagnosis not present

## 2018-02-19 DIAGNOSIS — L02219 Cutaneous abscess of trunk, unspecified: Secondary | ICD-10-CM | POA: Diagnosis not present

## 2018-02-19 DIAGNOSIS — R1032 Left lower quadrant pain: Secondary | ICD-10-CM | POA: Diagnosis not present

## 2018-02-21 ENCOUNTER — Ambulatory Visit (INDEPENDENT_AMBULATORY_CARE_PROVIDER_SITE_OTHER): Payer: Medicare Other | Admitting: Pharmacist Clinician (PhC)/ Clinical Pharmacy Specialist

## 2018-02-21 DIAGNOSIS — I82A12 Acute embolism and thrombosis of left axillary vein: Secondary | ICD-10-CM

## 2018-02-21 LAB — POCT INR: INR: 3.4 — AB (ref 2.0–3.0)

## 2018-02-21 NOTE — Patient Instructions (Signed)
Description   Hold dose today, then continue 1 tablet daily except 1.5 tablets each Monday and Friday.  Repeat INR in 2 weeks.

## 2018-02-23 DIAGNOSIS — I10 Essential (primary) hypertension: Secondary | ICD-10-CM | POA: Diagnosis not present

## 2018-03-02 ENCOUNTER — Ambulatory Visit (INDEPENDENT_AMBULATORY_CARE_PROVIDER_SITE_OTHER): Payer: Medicare Other | Admitting: Pharmacist Clinician (PhC)/ Clinical Pharmacy Specialist

## 2018-03-02 ENCOUNTER — Ambulatory Visit (INDEPENDENT_AMBULATORY_CARE_PROVIDER_SITE_OTHER): Payer: Medicare Other | Admitting: Cardiovascular Disease

## 2018-03-02 ENCOUNTER — Encounter: Payer: Self-pay | Admitting: Cardiovascular Disease

## 2018-03-02 DIAGNOSIS — E7800 Pure hypercholesterolemia, unspecified: Secondary | ICD-10-CM | POA: Diagnosis not present

## 2018-03-02 DIAGNOSIS — I482 Chronic atrial fibrillation, unspecified: Secondary | ICD-10-CM

## 2018-03-02 DIAGNOSIS — I10 Essential (primary) hypertension: Secondary | ICD-10-CM | POA: Diagnosis not present

## 2018-03-02 DIAGNOSIS — Z95810 Presence of automatic (implantable) cardiac defibrillator: Secondary | ICD-10-CM | POA: Diagnosis not present

## 2018-03-02 DIAGNOSIS — I82A12 Acute embolism and thrombosis of left axillary vein: Secondary | ICD-10-CM

## 2018-03-02 DIAGNOSIS — I519 Heart disease, unspecified: Secondary | ICD-10-CM | POA: Diagnosis not present

## 2018-03-02 LAB — POCT INR: INR: 2 (ref 2.0–3.0)

## 2018-03-02 NOTE — Assessment & Plan Note (Signed)
History of essential hypertension her blood pressure measured today 116/70.  He is on carvedilol and lisinopril.  Continue current meds at current dosing.

## 2018-03-02 NOTE — Assessment & Plan Note (Signed)
History of hyperlipidemia on statin therapy with lipid profile performed 12/18/2014 revealing a total cholesterol 164, LDL 79 and HDL 43.

## 2018-03-02 NOTE — Assessment & Plan Note (Signed)
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 Royann Shivers oh 06/04/2015 which he follows remotely.  He has not had an ICD discharge since implant.

## 2018-03-02 NOTE — Progress Notes (Signed)
11

## 2018-03-02 NOTE — Assessment & Plan Note (Signed)
History of A. fib on Coumadin anticoagulation.  He was on a novel or oral anticoagulant but because of a axillary DVT he was changed to Coumadin.

## 2018-03-02 NOTE — Patient Instructions (Signed)
Description   Then continue 1 tablet daily except 1.5 tablets each Monday and Friday.  Repeat INR in 4 weeks.

## 2018-03-02 NOTE — Patient Instructions (Signed)

## 2018-03-02 NOTE — Progress Notes (Signed)
03/02/2018 Marc Potter   [birth year]  [id number]  Primary Physician Egbert Garibaldi, Excell Seltzer., MD Primary Cardiologist: Runell Gess MD Nicholes Calamity, [place]  HPI:  Marc Potter is a 72 y.o.  moderately overweight married Caucasian male father of one biologic and 2 stepchildren, grandfather and 6 grandchildren whose mother-in-law, Marc Potter , is a patient of mine as well. I last saw him in the office  11/10/2016.Marland KitchenHe has history of hypertension and hyperlipidemia. He is a normal 2-D echo Myoview in the past. He had PAF on event monitoring she was symptomatic from it at that point several years ago I elected to begin him on aspirin alone. He does admit to mild increasing dyspnea on exertion but denies chest pain. He underwent outpatient diagnostic coronary arteriography by myself on 12/24/14 via the right radial approach revealing normal coronary arteries and severe LV dysfunction with an EF of 30%. His dyspnea has significantly improved. He remains in A. Fib with a ventricular response in the 70s today. He is on Savaysa oral anticoagulation. His carvedilol was titrated to 12.5 mg by mouth twice a day and ultimately up to 25 mg twice a day with the addition of lisinopril as well. Recent 2-D echo performed 03/04/15 revealed a persistent diminished EF in the 25-30% range although he is not symptomatic. I referred him to Dr. Royann Shivers for consideration of ICD implantation for primary prevention of sudden cardiac death. This was performed 22-Jun-2015 uneventfully. Since I saw him in the office last he has done well. He did develop a left axillary vein DVT back in December resulting in transitioning from a novel or oral anticoagulant to warfarin. His most recent 2-D echo performed 06/09/16 revealed an improvement in his ejection fraction from 25-30% up to 45-50%.  Since I saw him a year ago he is remained asymptomatic.  He had a near ICD discharge which supported at the last minute.   Current Meds    Medication Sig  . atorvastatin (LIPITOR) 10 MG tablet Take 10 mg by mouth at bedtime.  . carvedilol (COREG) 25 MG tablet TAKE ONE TABLET BY MOUTH TWICE DAILY  . Coenzyme Q10 (CO Q-10) 100 MG CAPS Take 1 tablet by mouth daily.  [name] glucosamine-chondroitin 500-400 MG tablet Take 1 tablet by mouth 2 (two) times daily.  [name] lisinopril (PRINIVIL,ZESTRIL) 10 MG tablet TAKE ONE TABLET BY MOUTH ONCE DAILY  . Multiple Vitamin (MULTIVITAMIN) tablet Take 1 tablet by mouth daily.  . multivitamin-lutein (OCUVITE-LUTEIN) CAPS capsule Take 1 capsule by mouth daily.  [name] thyroid (ARMOUR) 90 MG tablet Take 90 mg by mouth daily.  [name] warfarin (COUMADIN) 5 MG tablet TAKE 1 TO 1 AND 1/2 TABLETS BY MOUTH DAILY AS DIRECTED BY COUMADIN CLINIC  . warfarin (COUMADIN) 5 MG tablet TAKE 1 TO 1 & 1/2 (ONE & ONE-HALF) TABLETS BY MOUTH ONCE DAILY AS  DIRECTED  BY  COUMADIN  CLINIC     No Known Allergies  Social History   Socioeconomic History  . Marital status: Married    Spouse name: Not on file  . Number of children: Not on file  . Years of education: Not on file  . Highest education level: Not on file  Occupational History  . Not on file  Social Needs  . Financial resource strain: Not on file  . Food insecurity:    Worry: Not on file    Inability: Not on file  . Transportation needs:    Medical: Not on file  Non-medical: Not on file  Tobacco Use  . Smoking status: Former Smoker    Last attempt to quit: 06/15/1993    Years since quitting: 24.7  . Smokeless tobacco: Never Used  Substance and Sexual Activity  . Alcohol use: Yes    Comment: DRINKS ON OCCASION   . Drug use: No  . Sexual activity: Not on file  Lifestyle  . Physical activity:    Days per week: Not on file    Minutes per session: Not on file  . Stress: Not on file  Relationships  . Social connections:    Talks on phone: Not on file    Gets together: Not on file    Attends religious service: Not on file    Active member of club or  organization: Not on file    Attends meetings of clubs or organizations: Not on file    Relationship status: Not on file  . Intimate partner violence:    Fear of current or ex partner: Not on file    Emotionally abused: Not on file    Physically abused: Not on file    Forced sexual activity: Not on file  Other Topics Concern  . Not on file  Social History Narrative  . Not on file     Review of Systems: General: negative for chills, fever, night sweats or weight changes.  Cardiovascular: negative for chest pain, dyspnea on exertion, edema, orthopnea, palpitations, paroxysmal nocturnal dyspnea or shortness of breath Dermatological: negative for rash Respiratory: negative for cough or wheezing Urologic: negative for hematuria Abdominal: negative for nausea, vomiting, diarrhea, bright red blood per rectum, melena, or hematemesis Neurologic: negative for visual changes, syncope, or dizziness All other systems reviewed and are otherwise negative except as noted above.    Blood pressure 116/70, pulse 78, height 6' (1.829 m), weight 196 lb 3.2 oz (89 kg).  General appearance: alert and no distress Neck: no adenopathy, no carotid bruit, no JVD, supple, symmetrical, trachea midline and thyroid not enlarged, symmetric, no tenderness/mass/nodules Lungs: clear to auscultation bilaterally Heart: regular rate and rhythm Extremities: extremities normal, atraumatic, no cyanosis or edema Pulses: 2+ and symmetric Skin: Skin color, texture, turgor normal. No rashes or lesions Neurologic: Alert and oriented X 3, normal strength and tone. Normal symmetric reflexes. Normal coordination and gait  EKG not performed today  ASSESSMENT AND PLAN:   Essential hypertension History of essential hypertension her blood pressure measured today 116/70.  He is on carvedilol and lisinopril.  Continue current meds at current dosing.  Hyperlipidemia History of hyperlipidemia on statin therapy with lipid profile  performed 12/18/2014 revealing a total cholesterol 164, LDL 79 and HDL 43.  Atrial fibrillation (HCC) History of A. fib on Coumadin anticoagulation.  He was on a novel or oral anticoagulant but because of a axillary DVT he was changed to Coumadin.  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  He is asymptomatic.  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 Royann Shivers oh 06/04/2015 which he follows remotely.  He has not had an ICD discharge since implant.      Runell Gess MD FACP,FACC,FAHA, [facility] 03/02/2018 1:59 PM

## 2018-03-02 NOTE — Assessment & Plan Note (Signed)
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  He is asymptomatic.

## 2018-03-10 ENCOUNTER — Ambulatory Visit (INDEPENDENT_AMBULATORY_CARE_PROVIDER_SITE_OTHER): Payer: Medicare Other

## 2018-03-10 DIAGNOSIS — Z95810 Presence of automatic (implantable) cardiac defibrillator: Secondary | ICD-10-CM

## 2018-03-10 DIAGNOSIS — I5022 Chronic systolic (congestive) heart failure: Secondary | ICD-10-CM | POA: Diagnosis not present

## 2018-03-10 NOTE — Progress Notes (Signed)
EPIC Encounter for ICM Monitoring  Patient Name: Marc Potter is a 72 y.o. male Date: 03/10/2018 Primary Care Physican: Nonnie Done., MD Primary Cardiologist:Berry Electrophysiologist: Croitoru Dry Weight:190lbs       Heart Failure questions reviewed, pt asymptomatic.   Thoracic impedance but was abnormal suggesting fluid accumulation starting 03/04/2018.  Nodiuretic  Recommendations:  No changes.  Advised to limit salt intake. Encouraged to call for fluid symptoms.  Follow-up plan: ICM clinic phone appointment on 03/17/2018 to recheck fluid levels.      Copy of ICM check sent to Dr. Royann Shivers and Dr Allyson Sabal.   3 month ICM trend: 03/10/2018    1 Year ICM trend:       Karie Soda, RN 03/10/2018 3:12 PM

## 2018-03-17 ENCOUNTER — Ambulatory Visit (INDEPENDENT_AMBULATORY_CARE_PROVIDER_SITE_OTHER): Payer: Medicare Other

## 2018-03-17 DIAGNOSIS — Z95810 Presence of automatic (implantable) cardiac defibrillator: Secondary | ICD-10-CM

## 2018-03-17 DIAGNOSIS — I5022 Chronic systolic (congestive) heart failure: Secondary | ICD-10-CM

## 2018-03-17 NOTE — Progress Notes (Signed)
EPIC Encounter for ICM Monitoring  Patient Name: Marc Potter is a 72 y.o. male Date: 03/17/2018 Primary Care Physican: Nonnie Done., MD Primary Cardiologist:Berry Electrophysiologist: Croitoru Dry Weight:Previous weight 190lbs           Attempted call to patient and unable to reach.  Left detailed message, per DPR, regarding transmission.  Transmission reviewed.    Thoracic impedance abnormal suggesting fluid accumulation starting 03/04/2018.  Nodiuretic  Recommendations: Unable to reach.  Follow-up plan: ICM clinic phone appointment on 03/31/2018 to recheck fluid levels.      Copy of ICM check sent to Dr. Royann Shivers and Dr Allyson Sabal.  3 month ICM trend: 03/17/2018    1 Year ICM trend:       Karie Soda, RN 03/17/2018 10:23 AM

## 2018-03-18 ENCOUNTER — Telehealth: Payer: Self-pay

## 2018-03-18 NOTE — Telephone Encounter (Signed)
Remote ICM transmission received.  Attempted call to patient and left detailed message, per DPR, to return call regarding transmission and next ICM scheduled for 03/31/2018.  Advised to return call for any fluid symptoms or questions.

## 2018-03-31 ENCOUNTER — Ambulatory Visit (INDEPENDENT_AMBULATORY_CARE_PROVIDER_SITE_OTHER): Payer: Medicare Other | Admitting: *Deleted

## 2018-03-31 ENCOUNTER — Ambulatory Visit (INDEPENDENT_AMBULATORY_CARE_PROVIDER_SITE_OTHER): Payer: Medicare Other

## 2018-03-31 DIAGNOSIS — Z95810 Presence of automatic (implantable) cardiac defibrillator: Secondary | ICD-10-CM

## 2018-03-31 DIAGNOSIS — I5022 Chronic systolic (congestive) heart failure: Secondary | ICD-10-CM | POA: Diagnosis not present

## 2018-03-31 DIAGNOSIS — I255 Ischemic cardiomyopathy: Secondary | ICD-10-CM

## 2018-03-31 NOTE — Progress Notes (Signed)
EPIC Encounter for ICM Monitoring  Patient Name: Marc Potter is a 72 y.o. male Date: 03/31/2018 Primary Care Physican: Nonnie Done., MD Primary Cardiologist:Berry Electrophysiologist: Croitoru Dry Weight:190lbs      Heart Failure questions reviewed, pt asymptomatic.   Thoracic impedance abnormal suggesting fluid accumulation starting 03/04/2018.   Nodiuretic  Recommendations: No changes.  Reinforced limiting salt intake to < 2000 mg daily and fluid intake to 64 oz daily.  He is going to try to cut back on liquid and salt intake.  Encouraged to call for fluid symptoms.  Follow-up plan: ICM clinic phone appointment on 04/12/2018 to recheck fluid levels.      Copy of ICM check sent to Dr. Royann Shivers and Dr Allyson Sabal for review and if any recommendations will call back.   3 month ICM trend: 03/31/2018    1 Year ICM trend:       Karie Soda, RN 03/31/2018 12:49 PM

## 2018-03-31 NOTE — Progress Notes (Signed)
Remote ICD transmission.   

## 2018-04-11 ENCOUNTER — Ambulatory Visit (INDEPENDENT_AMBULATORY_CARE_PROVIDER_SITE_OTHER): Payer: Medicare Other | Admitting: Pharmacist

## 2018-04-11 DIAGNOSIS — I82A12 Acute embolism and thrombosis of left axillary vein: Secondary | ICD-10-CM

## 2018-04-11 DIAGNOSIS — E7800 Pure hypercholesterolemia, unspecified: Secondary | ICD-10-CM | POA: Diagnosis not present

## 2018-04-11 LAB — LIPID PANEL
CHOL/HDL RATIO: 4.3 ratio (ref 0.0–5.0)
Cholesterol, Total: 195 mg/dL (ref 100–199)
HDL: 45 mg/dL (ref >39)
LDL Calculated: 105 mg/dL — ABNORMAL HIGH (ref 0–99)
Triglycerides: 224 mg/dL — ABNORMAL HIGH (ref 0–149)
VLDL Cholesterol Cal: 45 mg/dL — ABNORMAL HIGH (ref 5–40)

## 2018-04-11 LAB — POCT INR: INR: 2.7 (ref 2.0–3.0)

## 2018-04-12 ENCOUNTER — Telehealth: Payer: Self-pay | Admitting: Cardiology

## 2018-04-12 ENCOUNTER — Ambulatory Visit (INDEPENDENT_AMBULATORY_CARE_PROVIDER_SITE_OTHER): Payer: Medicare Other

## 2018-04-12 DIAGNOSIS — Z95810 Presence of automatic (implantable) cardiac defibrillator: Secondary | ICD-10-CM | POA: Diagnosis not present

## 2018-04-12 DIAGNOSIS — I5022 Chronic systolic (congestive) heart failure: Secondary | ICD-10-CM | POA: Diagnosis not present

## 2018-04-12 NOTE — Telephone Encounter (Signed)
Spoke with pt and reminded pt of remote transmission that is due today. Pt verbalized understanding.   

## 2018-04-12 NOTE — Progress Notes (Signed)
Thank you,  MCr 

## 2018-04-12 NOTE — Progress Notes (Signed)
EPIC Encounter for ICM Monitoring  Patient Name: Marc Potter is a 72 y.o. male Date: 04/12/2018 Primary Care Physican: Nonnie Done., MD Primary Cardiologist:Berry Electrophysiologist: Croitoru Dry Weight:190lbs      Heart Failure questions reviewed, pt asymptomatic.   Thoracic impedance abnormal suggesting fluid accumulation but did return to baseline for a few days since last remote transmission 03/31/2018.   Prescribed: Nodiuretic  Recommendations:  No changes.  Reinforced limiting salt intake to < 2000 mg daily.  Encouraged to call for fluid symptoms.  Follow-up plan: ICM clinic phone appointment on 05/16/2018.    Copy of ICM check sent to Dr. Royann Shivers and Dr Allyson Sabal.   3 month ICM trend: 04/12/2018    1 Year ICM trend:       Karie Soda, RN 04/12/2018 2:48 PM

## 2018-04-17 ENCOUNTER — Other Ambulatory Visit: Payer: Self-pay | Admitting: Cardiovascular Disease

## 2018-04-21 ENCOUNTER — Ambulatory Visit (INDEPENDENT_AMBULATORY_CARE_PROVIDER_SITE_OTHER): Payer: Medicare Other

## 2018-04-21 DIAGNOSIS — I5022 Chronic systolic (congestive) heart failure: Secondary | ICD-10-CM

## 2018-04-21 DIAGNOSIS — Z95810 Presence of automatic (implantable) cardiac defibrillator: Secondary | ICD-10-CM

## 2018-04-22 NOTE — Progress Notes (Signed)
Error.  Next ICM remote transmission due 05/16/2018

## 2018-05-07 ENCOUNTER — Other Ambulatory Visit: Payer: Self-pay | Admitting: Cardiovascular Disease

## 2018-05-07 DIAGNOSIS — Z79899 Other long term (current) drug therapy: Secondary | ICD-10-CM

## 2018-05-07 DIAGNOSIS — I10 Essential (primary) hypertension: Secondary | ICD-10-CM

## 2018-05-07 DIAGNOSIS — I482 Chronic atrial fibrillation, unspecified: Secondary | ICD-10-CM

## 2018-05-07 DIAGNOSIS — I519 Heart disease, unspecified: Secondary | ICD-10-CM

## 2018-05-09 ENCOUNTER — Ambulatory Visit (INDEPENDENT_AMBULATORY_CARE_PROVIDER_SITE_OTHER): Payer: Medicare Other | Admitting: Pharmacist Clinician (PhC)/ Clinical Pharmacy Specialist

## 2018-05-09 DIAGNOSIS — I4891 Unspecified atrial fibrillation: Secondary | ICD-10-CM | POA: Diagnosis not present

## 2018-05-09 DIAGNOSIS — I82A12 Acute embolism and thrombosis of left axillary vein: Secondary | ICD-10-CM | POA: Diagnosis not present

## 2018-05-09 DIAGNOSIS — Z7901 Long term (current) use of anticoagulants: Secondary | ICD-10-CM

## 2018-05-09 LAB — POCT INR: INR: 2.9 (ref 2.0–3.0)

## 2018-05-16 ENCOUNTER — Telehealth: Payer: Self-pay

## 2018-05-16 ENCOUNTER — Ambulatory Visit (INDEPENDENT_AMBULATORY_CARE_PROVIDER_SITE_OTHER): Payer: Medicare Other

## 2018-05-16 DIAGNOSIS — I5022 Chronic systolic (congestive) heart failure: Secondary | ICD-10-CM

## 2018-05-16 DIAGNOSIS — Z95810 Presence of automatic (implantable) cardiac defibrillator: Secondary | ICD-10-CM

## 2018-05-16 NOTE — Progress Notes (Signed)
Thank you MCr 

## 2018-05-16 NOTE — Progress Notes (Signed)
EPIC Encounter for ICM Monitoring  Patient Name: Marc Potter is a 72 y.o. male Date: 05/16/2018 Primary Care Physican: Nonnie Done., MD Primary Cardiologist:Berry Electrophysiologist: Croitoru Last Weight:190lbs Today's Weight: unknown       Heart Failure questions reviewed, pt asymptomatic.   Thoracic impedance abnormal suggesting fluid accumulation starting 04/27/2018 and trending back close to baseline.   Prescribed: Nodiuretic  Recommendations:  Advised to limit salt intake to < 2000 mg daily and fluid intake to 64 oz daily.  Encouraged to call for fluid symptoms.  Follow-up plan: ICM clinic phone appointment on 06/16/2018.   Copy of ICM check sent to Dr. Royann Shivers.   3 month ICM trend: 05/16/2018    1 Year ICM trend:       Karie Soda, RN 05/16/2018 1:24 PM

## 2018-05-16 NOTE — Telephone Encounter (Signed)
LMOVM reminding pt to send remote transmission.   

## 2018-06-02 LAB — CUP PACEART REMOTE DEVICE CHECK
Battery Remaining Longevity: 102 mo
Brady Statistic RV Percent Paced: 0.07 %
HighPow Impedance: 71 Ohm
Implantable Lead Location: 753860
Lead Channel Impedance Value: 266 Ohm
Lead Channel Impedance Value: 380 Ohm
Lead Channel Sensing Intrinsic Amplitude: 12.5 mV
Lead Channel Setting Pacing Amplitude: 2.5 V
Lead Channel Setting Pacing Pulse Width: 0.4 ms
Lead Channel Setting Sensing Sensitivity: 0.3 mV
MDC IDC LEAD IMPLANT DT: 20161220
MDC IDC MSMT BATTERY VOLTAGE: 2.99 V
MDC IDC MSMT LEADCHNL RV PACING THRESHOLD AMPLITUDE: 0.625 V
MDC IDC MSMT LEADCHNL RV PACING THRESHOLD PULSEWIDTH: 0.4 ms
MDC IDC MSMT LEADCHNL RV SENSING INTR AMPL: 12.5 mV
MDC IDC PG IMPLANT DT: 20161220
MDC IDC SESS DTM: 20191017092724

## 2018-06-03 DIAGNOSIS — N401 Enlarged prostate with lower urinary tract symptoms: Secondary | ICD-10-CM | POA: Diagnosis not present

## 2018-06-03 DIAGNOSIS — N138 Other obstructive and reflux uropathy: Secondary | ICD-10-CM | POA: Diagnosis not present

## 2018-06-03 DIAGNOSIS — R972 Elevated prostate specific antigen [PSA]: Secondary | ICD-10-CM | POA: Diagnosis not present

## 2018-06-03 DIAGNOSIS — Z8042 Family history of malignant neoplasm of prostate: Secondary | ICD-10-CM | POA: Diagnosis not present

## 2018-06-16 ENCOUNTER — Ambulatory Visit (INDEPENDENT_AMBULATORY_CARE_PROVIDER_SITE_OTHER): Payer: Medicare Other

## 2018-06-16 ENCOUNTER — Other Ambulatory Visit: Payer: Self-pay

## 2018-06-16 DIAGNOSIS — I5022 Chronic systolic (congestive) heart failure: Secondary | ICD-10-CM | POA: Diagnosis not present

## 2018-06-16 DIAGNOSIS — Z95810 Presence of automatic (implantable) cardiac defibrillator: Secondary | ICD-10-CM | POA: Diagnosis not present

## 2018-06-16 DIAGNOSIS — I10 Essential (primary) hypertension: Secondary | ICD-10-CM

## 2018-06-16 DIAGNOSIS — Z79899 Other long term (current) drug therapy: Secondary | ICD-10-CM

## 2018-06-16 DIAGNOSIS — I519 Heart disease, unspecified: Secondary | ICD-10-CM

## 2018-06-16 DIAGNOSIS — I482 Chronic atrial fibrillation, unspecified: Secondary | ICD-10-CM

## 2018-06-16 MED ORDER — CARVEDILOL 25 MG PO TABS: 25.0000 mg | ORAL_TABLET | Freq: Two times a day (BID) | ORAL | 2 refills | Status: DC

## 2018-06-16 MED ORDER — LISINOPRIL 10 MG PO TABS: 10.0000 mg | ORAL_TABLET | Freq: Every day | ORAL | 2 refills | Status: DC

## 2018-06-17 NOTE — Progress Notes (Signed)
EPIC Encounter for ICM Monitoring  Patient Name: Marc Potter is a 73 y.o. male Date: 06/17/2018 Primary Care Physican: Nonnie Done., MD Primary Cardiologist:Berry Electrophysiologist: Croitoru Last Weight:190lbs Today's Weight: unknown     Transmission received.   Thoracic impedance normal.  Prescribed dosage: Nodiuretic  Recommendations: None.  Follow-up plan: ICM clinic phone appointment on 07/18/2018.    Copy of ICM check sent to Dr. Royann Shivers.   3 month ICM trend: 06/16/2018    1 Year ICM trend:       Karie Soda, RN 06/17/2018 9:09 AM

## 2018-06-20 ENCOUNTER — Telehealth: Payer: Self-pay | Admitting: Cardiovascular Disease

## 2018-06-20 ENCOUNTER — Ambulatory Visit (INDEPENDENT_AMBULATORY_CARE_PROVIDER_SITE_OTHER): Payer: Medicare Other | Admitting: Pharmacist

## 2018-06-20 DIAGNOSIS — I82A12 Acute embolism and thrombosis of left axillary vein: Secondary | ICD-10-CM | POA: Diagnosis not present

## 2018-06-20 LAB — POCT INR: INR: 2.8 (ref 2.0–3.0)

## 2018-06-20 NOTE — Telephone Encounter (Signed)
Spoke with pt and informed that per last OV on 9/18, Dr. Allyson Sabal stating he would like to f/u with pt in 6 months. Appointment scheduled for 10/11/18 at 11 am with Dr. Allyson Sabal.

## 2018-06-20 NOTE — Telephone Encounter (Signed)
New Message   Pt's wife is calling states she has a note stating the pt needs to come back in 6 months to see Dr. Allyson Sabal but the recall in the system says 1 year. Please call to clarify

## 2018-06-22 ENCOUNTER — Other Ambulatory Visit: Payer: Self-pay

## 2018-06-22 MED ORDER — WARFARIN SODIUM 5 MG PO TABS: 5.0000 mg | ORAL_TABLET | Freq: Every day | ORAL | 3 refills | Status: DC

## 2018-06-30 ENCOUNTER — Ambulatory Visit (INDEPENDENT_AMBULATORY_CARE_PROVIDER_SITE_OTHER): Payer: Medicare Other

## 2018-06-30 DIAGNOSIS — I255 Ischemic cardiomyopathy: Secondary | ICD-10-CM | POA: Diagnosis not present

## 2018-07-01 NOTE — Progress Notes (Signed)
Remote ICD transmission.   

## 2018-07-02 LAB — CUP PACEART REMOTE DEVICE CHECK
Battery Remaining Longevity: 95 mo
Battery Voltage: 2.98 V
Brady Statistic RV Percent Paced: 0.08 %
Date Time Interrogation Session: 20200116072825
HighPow Impedance: 81 Ohm
Implantable Lead Implant Date: 20161220
Implantable Lead Location: 753860
Lead Channel Impedance Value: 342 Ohm
Lead Channel Impedance Value: 437 Ohm
Lead Channel Pacing Threshold Amplitude: 0.75 V
Lead Channel Sensing Intrinsic Amplitude: 13.125 mV
Lead Channel Setting Pacing Amplitude: 2.5 V
Lead Channel Setting Pacing Pulse Width: 0.4 ms
Lead Channel Setting Sensing Sensitivity: 0.3 mV
MDC IDC MSMT LEADCHNL RV PACING THRESHOLD PULSEWIDTH: 0.4 ms
MDC IDC MSMT LEADCHNL RV SENSING INTR AMPL: 13.125 mV
MDC IDC PG IMPLANT DT: 20161220

## 2018-07-18 ENCOUNTER — Ambulatory Visit (INDEPENDENT_AMBULATORY_CARE_PROVIDER_SITE_OTHER): Payer: Medicare Other | Admitting: *Deleted

## 2018-07-18 ENCOUNTER — Ambulatory Visit (INDEPENDENT_AMBULATORY_CARE_PROVIDER_SITE_OTHER): Payer: Medicare Other

## 2018-07-18 DIAGNOSIS — Z95810 Presence of automatic (implantable) cardiac defibrillator: Secondary | ICD-10-CM | POA: Diagnosis not present

## 2018-07-18 DIAGNOSIS — I5022 Chronic systolic (congestive) heart failure: Secondary | ICD-10-CM | POA: Diagnosis not present

## 2018-07-18 DIAGNOSIS — I82A12 Acute embolism and thrombosis of left axillary vein: Secondary | ICD-10-CM

## 2018-07-18 LAB — POCT INR: INR: 2.6 (ref 2.0–3.0)

## 2018-07-18 NOTE — Patient Instructions (Signed)
Description   Continue 1 tablet daily except 1.5 tablets each Monday and Friday.  Repeat INR in 6 weeks.

## 2018-07-19 ENCOUNTER — Telehealth: Payer: Self-pay

## 2018-07-19 NOTE — Progress Notes (Signed)
Thanks MCr 

## 2018-07-19 NOTE — Progress Notes (Signed)
EPIC Encounter for ICM Monitoring  Patient Name: Marc Potter is a 73 y.o. male Date: 07/19/2018 Primary Care Physican: Marc Done., MD Primary Cardiologist:Marc Potter Electrophysiologist: Marc Potter LastWeight:190lbs Today's Weight: unknown                                     Attempted call to patient and unable to reach.  Left detailed message per DPR regarding transmission. Transmission reviewed.    Thoracic impedance normal.  Prescribed dosage: Nodiuretic  Recommendations: Left voice mail with ICM number and encouraged to call if experiencing any fluid symptoms.  Follow-up plan: ICM clinic phone appointment on 08/22/2018.    Copy of ICM check sent to Dr. Royann Shivers.   3 month ICM trend: 07/18/2018    1 Year ICM trend:       Marc Soda, RN 07/19/2018 2:58 PM

## 2018-07-19 NOTE — Telephone Encounter (Signed)
Remote ICM transmission received.  Attempted call to patient regarding ICM remote transmission and left detailed message, per DPR, with next ICM remote transmission date of 08/22/2018.  Advised to return call for any fluid symptoms or questions.    

## 2018-08-22 ENCOUNTER — Telehealth: Payer: Self-pay

## 2018-08-22 ENCOUNTER — Ambulatory Visit (INDEPENDENT_AMBULATORY_CARE_PROVIDER_SITE_OTHER): Payer: Medicare Other

## 2018-08-22 DIAGNOSIS — Z95810 Presence of automatic (implantable) cardiac defibrillator: Secondary | ICD-10-CM

## 2018-08-22 DIAGNOSIS — I5022 Chronic systolic (congestive) heart failure: Secondary | ICD-10-CM | POA: Diagnosis not present

## 2018-08-22 NOTE — Telephone Encounter (Signed)
Spoke with patient to remind of missed remote transmission 

## 2018-08-24 ENCOUNTER — Telehealth: Payer: Self-pay

## 2018-08-24 NOTE — Telephone Encounter (Signed)
Remote ICM transmission received.  Attempted call to patient regarding ICM remote transmission and left detailed message, per DPR, with next ICM remote transmission date of 09/28/2018.  Advised to return call for any fluid symptoms or questions.

## 2018-08-24 NOTE — Progress Notes (Signed)
EPIC Encounter for ICM Monitoring  Patient Name: Marc Potter is a 73 y.o. male Date: 08/24/2018 Primary Care Physican: Nonnie Done., MD Primary Cardiologist:Berry Electrophysiologist: Croitoru LastWeight:190lbs Today's Weight: unknown  Attempted call to patient and unable to reach.  Left detailed message per DPR regarding transmission. Transmission reviewed.   Thoracic impedance normal.  Prescribed dosage:Nodiuretic  Recommendations:Left voice mail with ICM number and encouraged to call if experiencing any fluid symptoms.  Follow-up plan: ICM clinic phone appointment on4/15/2020 (91 day 4/16).   Copy of ICM check sent to Dr.Croitoru.  3 month ICM trend: 08/22/2018    1 Year ICM trend:       Karie Soda, RN 08/24/2018 9:17 AM

## 2018-09-08 ENCOUNTER — Telehealth: Payer: Self-pay | Admitting: *Deleted

## 2018-09-08 NOTE — Telephone Encounter (Signed)
Pt's wife called and canceled appt for coumadin check due to conflict. Pt will get coumadin checked at clinic in [place].

## 2018-09-08 NOTE — Telephone Encounter (Signed)
Pt will need appt to be checked in [place] office. Will route to CMA to get appt in  office.

## 2018-09-08 NOTE — Telephone Encounter (Signed)
RETURNED A CALL TO PATIENT REGARDING COUMADIN APPT SCHEDULED HIM FOR 4/28 AT [facility] SINCE HE IS SEEING BERRY WILL CHANGE LATER IF NEED BE

## 2018-09-28 ENCOUNTER — Ambulatory Visit (INDEPENDENT_AMBULATORY_CARE_PROVIDER_SITE_OTHER): Payer: Medicare Other

## 2018-09-28 ENCOUNTER — Other Ambulatory Visit: Payer: Self-pay

## 2018-09-28 DIAGNOSIS — Z95810 Presence of automatic (implantable) cardiac defibrillator: Secondary | ICD-10-CM | POA: Diagnosis not present

## 2018-09-28 DIAGNOSIS — I5022 Chronic systolic (congestive) heart failure: Secondary | ICD-10-CM | POA: Diagnosis not present

## 2018-09-29 ENCOUNTER — Other Ambulatory Visit: Payer: Self-pay

## 2018-09-29 ENCOUNTER — Ambulatory Visit (INDEPENDENT_AMBULATORY_CARE_PROVIDER_SITE_OTHER): Payer: Medicare Other | Admitting: *Deleted

## 2018-09-29 DIAGNOSIS — I255 Ischemic cardiomyopathy: Secondary | ICD-10-CM | POA: Diagnosis not present

## 2018-09-30 LAB — CUP PACEART REMOTE DEVICE CHECK
Battery Remaining Longevity: 90 mo
Battery Voltage: 2.98 V
Brady Statistic RV Percent Paced: 0.12 %
Date Time Interrogation Session: 20200415062824
HighPow Impedance: 70 Ohm
Implantable Lead Implant Date: 20161220
Implantable Lead Location: 753860
Implantable Pulse Generator Implant Date: 20161220
Lead Channel Impedance Value: 304 Ohm
Lead Channel Impedance Value: 342 Ohm
Lead Channel Pacing Threshold Amplitude: 0.625 V
Lead Channel Pacing Threshold Pulse Width: 0.4 ms
Lead Channel Sensing Intrinsic Amplitude: 12.125 mV
Lead Channel Sensing Intrinsic Amplitude: 12.125 mV
Lead Channel Setting Pacing Amplitude: 2.5 V
Lead Channel Setting Pacing Pulse Width: 0.4 ms
Lead Channel Setting Sensing Sensitivity: 0.3 mV

## 2018-09-30 NOTE — Progress Notes (Signed)
EPIC Encounter for ICM Monitoring  Patient Name: Marc Potter is a 73 y.o. male Date: 09/30/2018 Primary Care Physican: Nonnie Done., MD Primary Cardiologist:Berry Electrophysiologist: Croitoru LastWeight:190lbs Today's Weight: unknown  Heart failure questions reviewed.  Patient asking if he can drink gatorade and/or quinine water.  Advised again drinking the gatorade unless he is working out and sweating a lot and quinine water may interact with Warfarin.  He verbalized understanding.   Thoracic impedance normal.  Prescribed dosage:Nodiuretic  Recommendations:No changes and encouraged to call for any fluid symptoms.   Follow-up plan: ICM clinic phone appointment on5/19/2020.  Office visit scheduled with Dr Royann Shivers and Dr Allyson Sabal on 10/11/2018.   Remote transmission scheduled for 4/27 in the event office visit changes to virtual visit.  Copy of ICM check sent to Dr.Croitoru.  3 month ICM trend: 09/29/2018    1 Year ICM trend:       Karie Soda, RN 09/30/2018 3:44 PM

## 2018-10-03 ENCOUNTER — Telehealth: Payer: Self-pay | Admitting: Cardiovascular Disease

## 2018-10-03 ENCOUNTER — Telehealth: Payer: Self-pay

## 2018-10-03 NOTE — Telephone Encounter (Signed)
Cardiac Questionnaire:    Since your last visit or hospitalization:    1. Have you been having new or worsening chest pain? NO   2. Have you been having new or worsening shortness of breath? NO 3. Have you been having new or worsening leg swelling, wt gain, or increase in abdominal girth (pants fitting more tightly)? NO   4. Have you had any passing out spells? NO    *A YES to any of these questions would result in the appointment being kept. *If all the answers to these questions are NO, we should indicate that given the current situation regarding the worldwide coronarvirus pandemic, at the recommendation of the CDC, we are looking to limit gatherings in our waiting area, and thus will reschedule their appointment beyond four weeks from today.   _____________   COVID-19 Pre-Screening Questions:   Do you currently have a fever? NO (yes = cancel and refer to pcp for e-visit)  Have you recently travelled on a cruise, internationally, or to [place], [place], [place], [place], [place], or [place], [place] [employer]) ? IN [place] IN EARLY MARCH 2020 (yes = cancel, stay home, monitor symptoms, and contact pcp or initiate e-visit if symptoms develop)  Have you been in contact with someone that is currently pending confirmation of Covid19 testing or has been confirmed to have the Covid19 virus?  NO (yes = cancel, stay home, away from tested individual, monitor symptoms, and contact pcp or initiate e-visit if symptoms develop)  Are you currently experiencing fatigue or cough? NO (yes = pt should be prepared to have a mask placed at the time of their visit).      Virtual Visit Pre-Appointment Phone Call  Steps For Call:  1. Confirm consent - "In the setting of the current Covid19 crisis, you are scheduled for a (phone or video) visit with your provider on (date) at (time).  Just as we do with many in-office visits, in order for you to participate in this visit, we must obtain consent.  If you'd like, I can send  this to your mychart (if signed up) or email for you to review.  Otherwise, I can obtain your verbal consent now.  All virtual visits are billed to your insurance company just like a normal visit would be.  By agreeing to a virtual visit, we'd like you to understand that the technology does not allow for your provider to perform an examination, and thus may limit your provider's ability to fully assess your condition. If your provider identifies any concerns that need to be evaluated in person, we will make arrangements to do so.  Finally, though the technology is pretty good, we cannot assure that it will always work on either your or our end, and in the setting of a video visit, we may have to convert it to a phone-only visit.  In either situation, we cannot ensure that we have a secure connection.  Are you willing to proceed?" STAFF: Did the patient verbally acknowledge consent to telehealth visit? Document YES/NO here: YES  2. Confirm the BEST phone number to call the day of the visit by including in appointment notes  3. Give patient instructions for WebEx/MyChart download to smartphone as below or Doximity/Doxy.me if video visit (depending on what platform provider is using)  4. Advise patient to be prepared with their blood pressure, heart rate, weight, any heart rhythm information, their current medicines, and a piece of paper and pen handy for any instructions they may  receive the day of their visit  5. Inform patient they will receive a phone call 15 minutes prior to their appointment time (may be from unknown caller ID) so they should be prepared to answer  6. Confirm that appointment type is correct in Epic appointment notes (VIDEO vs PHONE)     TELEPHONE CALL NOTE  Paydon Carll has been deemed a candidate for a follow-up tele-health visit to limit community exposure during the Covid-19 pandemic. I spoke with the patient via phone to ensure availability of phone/video source, confirm  preferred email & phone number, and discuss instructions and expectations.  I reminded Cristiano Capri to be prepared with any vital sign and/or heart rhythm information that could potentially be obtained via home monitoring, at the time of his visit. I reminded Cobin Cadavid to expect a phone call at the time of his visit if his visit.  Levander Katzenstein Cottie Banda, RN 10/03/2018 8:44 AM   INSTRUCTIONS FOR DOWNLOADING THE WEBEX APP TO SMARTPHONE  - If Apple, ask patient to go to App Store and type in WebEx in the search bar. Download [employer], the blue/green circle. If Android, go to [employer] and type in [employer] in the search bar. The app is free but as with any other app downloads, their phone may require them to verify saved payment information or Apple/Android password.  - The patient does NOT have to create an account. - On the day of the visit, the assist will walk the patient through joining the meeting with the meeting number/password.  INSTRUCTIONS FOR DOWNLOADING THE MYCHART APP TO SMARTPHONE  - The patient must first make sure to have activated MyChart and know their login information - If Apple, go to [employer] and type in MyChart in the search bar and download the app. If Android, ask patient to go to [employer] and type in [place] in the search bar and download the app. The app is free but as with any other app downloads, their phone may require them to verify saved payment information or Apple/Android password.  - The patient will need to then log into the app with their MyChart username and password, and select [facility] as their healthcare provider to link the account. When it is time for your visit, go to the MyChart app, find appointments, and click Begin Video Visit. Be sure to Select Allow for your device to access the Microphone and Camera for your visit. You will then be connected, and your provider will be with you shortly.  **If they have any issues  connecting, or need assistance please contact MyChart service desk [phone])**  **If using a computer, in order to ensure the best quality for their visit they will need to use either of the following Internet Browsers: [employer], or Google Chrome**  IF USING DOXIMITY or DOXY.ME - The patient will receive a link just prior to their visit, either by text or email (to be determined day of appointment depending on if it's doxy.me or Doximity).     FULL LENGTH CONSENT FOR TELE-HEALTH VISIT   I hereby voluntarily request, consent and authorize [facility] and its employed or contracted physicians, physician assistants, nurse practitioners or other licensed health care professionals (the Practitioner), to provide me with telemedicine health care services (the Services") as deemed necessary by the treating Practitioner. I acknowledge and consent to receive the Services by the Practitioner via telemedicine. I understand that the telemedicine visit will involve communicating  with the Practitioner through live audiovisual communication technology and the disclosure of certain medical information by electronic transmission. I acknowledge that I have been given the opportunity to request an in-person assessment or other available alternative prior to the telemedicine visit and am voluntarily participating in the telemedicine visit.  I understand that I have the right to withhold or withdraw my consent to the use of telemedicine in the course of my care at any time, without affecting my right to future care or treatment, and that the Practitioner or I may terminate the telemedicine visit at any time. I understand that I have the right to inspect all information obtained and/or recorded in the course of the telemedicine visit and may receive copies of available information for a reasonable fee.  I understand that some of the potential risks of receiving the Services via telemedicine  include:   Delay or interruption in medical evaluation due to technological equipment failure or disruption;  Information transmitted may not be sufficient (e.g. poor resolution of images) to allow for appropriate medical decision making by the Practitioner; and/or   In rare instances, security protocols could fail, causing a breach of personal health information.  Furthermore, I acknowledge that it is my responsibility to provide information about my medical history, conditions and care that is complete and accurate to the best of my ability. I acknowledge that Practitioner's advice, recommendations, and/or decision may be based on factors not within their control, such as incomplete or inaccurate data provided by me or distortions of diagnostic images or specimens that may result from electronic transmissions. I understand that the practice of medicine is not an exact science and that Practitioner makes no warranties or guarantees regarding treatment outcomes. I acknowledge that I will receive a copy of this consent concurrently upon execution via email to the email address I last provided but may also request a printed copy by calling the office of [facility].    I understand that my insurance will be billed for this visit.   I have read or had this consent read to me.  I understand the contents of this consent, which adequately explains the benefits and risks of the Services being provided via telemedicine.   I have been provided ample opportunity to ask questions regarding this consent and the Services and have had my questions answered to my satisfaction.  I give my informed consent for the services to be provided through the use of telemedicine in my medical care  By participating in this telemedicine visit I agree to the above.

## 2018-10-03 NOTE — Telephone Encounter (Signed)
Wife is calling back regarding husbands upcoming appt with Dr Royann Shivers. She states someone called about making it a virtual appt.

## 2018-10-03 NOTE — Telephone Encounter (Signed)
Unable to find who placed call. Wife aware that will route message to Dr. Salena Saner nurse for set up of virtual appt

## 2018-10-03 NOTE — Telephone Encounter (Signed)
Spoke to patient's wife virtual visit( Doxy.me) scheduled with Dr.Croitoru 10/10/18 at 8:00 am.

## 2018-10-06 ENCOUNTER — Telehealth: Payer: Self-pay | Admitting: *Deleted

## 2018-10-06 ENCOUNTER — Encounter: Payer: Self-pay | Admitting: Cardiology

## 2018-10-06 ENCOUNTER — Telehealth: Payer: Self-pay | Admitting: Cardiovascular Disease

## 2018-10-06 ENCOUNTER — Encounter: Payer: Self-pay | Admitting: *Deleted

## 2018-10-06 NOTE — Telephone Encounter (Signed)
Virtual Visit Pre-Appointment Phone Call  "(Name), I am calling you today to discuss your upcoming appointment. We are currently trying to limit exposure to the virus that causes COVID-19 by seeing patients at home rather than in the office."  1. "What is the BEST phone Potter to call the day of the visit?" - include this in appointment notes  2. Do you have or have access to (through a family member/friend) a smartphone with video capability that we can use for your visit?" a. If yes - list this Potter in appt notes as cell (if different from BEST phone #) and list the appointment type as a VIDEO visit in appointment notes b. If no - list the appointment type as a PHONE visit in appointment notes  3. Confirm consent - "In the setting of the current Covid19 crisis, you are scheduled for a (phone or video) visit with your provider on (date) at (time).  Just as we do with many in-office visits, in order for you to participate in this visit, we must obtain consent.  If you'd like, I can send this to your mychart (if signed up) or email for you to review.  Otherwise, I can obtain your verbal consent now.  All virtual visits are billed to your insurance company just like a normal visit would be.  By agreeing to a virtual visit, we'd like you to understand that the technology does not allow for your provider to perform an examination, and thus may limit your provider's ability to fully assess your condition. If your provider identifies any concerns that need to be evaluated in person, we will make arrangements to do so.  Finally, though the technology is pretty good, we cannot assure that it will always work on either your or our end, and in the setting of a video visit, we may have to convert it to a phone-only visit.  In either situation, we cannot ensure that we have a secure connection.  Are you willing to proceed?" STAFF: Did the patient verbally acknowledge consent to telehealth visit? Document  YES/NO here: YES  4. Advise patient to be prepared - "Two hours prior to your appointment, go ahead and check your blood pressure, pulse, oxygen saturation, and your weight (if you have the equipment to check those) and write them all down. When your visit starts, your provider will ask you for this information. If you have an Apple Watch or Kardia device, please plan to have heart rate information ready on the day of your appointment. Please have a pen and paper handy nearby the day of the visit as well."  5. Give patient instructions for MyChart download to smartphone OR Doximity/Doxy.me as below if video visit (depending on what platform provider is using)  6. Inform patient they will receive a phone call 15 minutes prior to their appointment time (may be from unknown caller ID) so they should be prepared to answer    TELEPHONE CALL NOTE  Marc Potter has been deemed a candidate for a follow-up tele-health visit to limit community exposure during the Covid-19 pandemic. I spoke with the patient via phone to ensure availability of phone/video source, confirm preferred email & phone Potter, and discuss instructions and expectations.  I reminded Marc Potter to be prepared with any vital sign and/or heart rhythm information that could potentially be obtained via home monitoring, at the time of his visit. I reminded Marc Potter to expect a phone call prior to his visit.  Marc Potter, CMA 10/06/2018 3:00 PM   INSTRUCTIONS FOR DOWNLOADING THE MYCHART APP TO SMARTPHONE  - The patient must first make sure to have activated MyChart and know their login information - If Apple, go to [employer] and type in MyChart in the search bar and download the app. If Android, ask patient to go to [employer] and type in [place] in the search bar and download the app. The app is free but as with any other app downloads, their phone may require them to verify saved payment information or  Apple/Android password.  - The patient will need to then log into the app with their MyChart username and password, and select Marc Potter as their healthcare provider to link the account. When it is time for your visit, go to the MyChart app, find appointments, and click Begin Video Visit. Be sure to Select Allow for your device to access the Microphone and Camera for your visit. You will then be connected, and your provider will be with you shortly.  **If they have any issues connecting, or need assistance please contact MyChart service desk [phone])**  **If using a computer, in order to ensure the best quality for their visit they will need to use either of the following Internet Browsers: [employer], or Google Chrome**  IF USING DOXIMITY or DOXY.ME - The patient will receive a link just prior to their visit by text.     FULL LENGTH CONSENT FOR TELE-HEALTH VISIT   I hereby voluntarily request, consent and authorize [facility] and its employed or contracted physicians, physician assistants, nurse practitioners or other licensed health care professionals (the Practitioner), to provide me with telemedicine health care services (the Services") as deemed necessary by the treating Practitioner. I acknowledge and consent to receive the Services by the Practitioner via telemedicine. I understand that the telemedicine visit will involve communicating with the Practitioner through live audiovisual communication technology and the disclosure of certain medical information by electronic transmission. I acknowledge that I have been given the opportunity to request an in-person assessment or other available alternative prior to the telemedicine visit and am voluntarily participating in the telemedicine visit.  I understand that I have the right to withhold or withdraw my consent to the use of telemedicine in the course of my care at any time, without affecting my right to future care  or treatment, and that the Practitioner or I may terminate the telemedicine visit at any time. I understand that I have the right to inspect all information obtained and/or recorded in the course of the telemedicine visit and may receive copies of available information for a reasonable fee.  I understand that some of the potential risks of receiving the Services via telemedicine include:   Delay or interruption in medical evaluation due to technological equipment failure or disruption;  Information transmitted may not be sufficient (e.g. poor resolution of images) to allow for appropriate medical decision making by the Practitioner; and/or   In rare instances, security protocols could fail, causing a breach of personal health information.  Furthermore, I acknowledge that it is my responsibility to provide information about my medical history, conditions and care that is complete and accurate to the best of my ability. I acknowledge that Practitioner's advice, recommendations, and/or decision may be based on factors not within their control, such as incomplete or inaccurate data provided by me or distortions of diagnostic images or specimens that may result from electronic transmissions. I understand that  the practice of medicine is not an exact science and that Practitioner makes no warranties or guarantees regarding treatment outcomes. I acknowledge that I will receive a copy of this consent concurrently upon execution via email to the email address I last provided but may also request a printed copy by calling the office of [facility].    I understand that my insurance will be billed for this visit.   I have read or had this consent read to me.  I understand the contents of this consent, which adequately explains the benefits and risks of the Services being provided via telemedicine.   I have been provided ample opportunity to ask questions regarding this consent and the Services and have had  my questions answered to my satisfaction.  I give my informed consent for the services to be provided through the use of telemedicine in my medical care  By participating in this telemedicine visit I agree to the above.   Cardiac Questionnaire:    Since your last visit or hospitalization:    1. Have you been having new or worsening chest pain? NO   2. Have you been having new or worsening shortness of breath? NO 3. Have you been having new or worsening leg swelling, wt gain, or increase in abdominal girth (pants fitting more tightly)? NO   4. Have you had any passing out spells? NO    *A YES to any of these questions would result in the appointment being kept. *If all the answers to these questions are NO, we should indicate that given the current situation regarding the worldwide coronarvirus pandemic, at the recommendation of the CDC, we are looking to limit gatherings in our waiting area, and thus will reschedule their appointment beyond four weeks from today.   _____________   COVID-19 Pre-Screening Questions:   Do you currently have a fever? NO  Have you recently travelled on a cruise, internationally, or to [place], [place], [place], [place], [place], or [place], [place] [employer])? NO  Have you been in contact with someone that is currently pending confirmation of Covid19 testing or has been confirmed to have the Covid19 virus?  NO  Are you currently experiencing fatigue or cough? NO

## 2018-10-06 NOTE — Telephone Encounter (Signed)
Mychart, smartphone, pre reg complete 10/06/18 AF °

## 2018-10-06 NOTE — Telephone Encounter (Signed)
Spoke with patient and he was ok to move his appointment from 10-11-2018 to tomorrow 10-07-2018. We reviewed his medications, allergies, pharmacy, and history.

## 2018-10-06 NOTE — Progress Notes (Signed)
Remote ICD transmission.   

## 2018-10-07 ENCOUNTER — Telehealth: Payer: Medicare Other | Admitting: Cardiovascular Disease

## 2018-10-07 ENCOUNTER — Telehealth (INDEPENDENT_AMBULATORY_CARE_PROVIDER_SITE_OTHER): Payer: Medicare Other | Admitting: Cardiovascular Disease

## 2018-10-07 ENCOUNTER — Telehealth: Payer: Self-pay

## 2018-10-07 DIAGNOSIS — I428 Other cardiomyopathies: Secondary | ICD-10-CM

## 2018-10-07 DIAGNOSIS — I10 Essential (primary) hypertension: Secondary | ICD-10-CM | POA: Diagnosis not present

## 2018-10-07 DIAGNOSIS — E782 Mixed hyperlipidemia: Secondary | ICD-10-CM

## 2018-10-07 DIAGNOSIS — I519 Heart disease, unspecified: Secondary | ICD-10-CM

## 2018-10-07 DIAGNOSIS — I4811 Longstanding persistent atrial fibrillation: Secondary | ICD-10-CM | POA: Diagnosis not present

## 2018-10-07 DIAGNOSIS — Z9189 Other specified personal risk factors, not elsewhere classified: Secondary | ICD-10-CM | POA: Diagnosis not present

## 2018-10-07 DIAGNOSIS — Z95810 Presence of automatic (implantable) cardiac defibrillator: Secondary | ICD-10-CM

## 2018-10-07 NOTE — Progress Notes (Signed)
Virtual Visit via Video Note   This visit type was conducted due to national recommendations for restrictions regarding the COVID-19 Pandemic (e.g. social distancing) in an effort to limit this patient's exposure and mitigate transmission in our community.  Due to his co-morbid illnesses, this patient is at least at moderate risk for complications without adequate follow up.  This format is felt to be most appropriate for this patient at this time.  All issues noted in this document were discussed and addressed.  A limited physical exam was performed with this format.  Please refer to the patient's chart for his consent to telehealth for [facility].   Evaluation Performed:  Follow-up visit  Date:  10/07/2018   ID:  Marc Potter, DOB 07/14/1945, MRN [id number]  Patient Location: Home Provider Location: Home  PCP:  Nonnie Done., MD  Cardiologist:  [name], MD  Electrophysiologist:  Thurmon Fair, MD   Chief Complaint: 26-month follow-up A. fib and nonischemic cardiomyopathy  History of Present Illness:    Marc Potter is a 73 y.o.  moderately overweight married Caucasian male (wife [name]) father of one biologic and 2 stepchildren, grandfather and 6 grandchildren whose mother-in-law, Marc Potter , is a patient of mine as well, who unfortunately died Oct 27, 2017 of lung cancer.. I last saw him in the office  03/02/2018.Marland KitchenHe has history of hypertension and hyperlipidemia. He is a normal 2-D echo Myoview in the past. He had PAF on event monitoring she was symptomatic from it at that point several years ago I elected to begin him on aspirin alone. He does admit to mild increasing dyspnea on exertion but denies chest pain. He underwent outpatient diagnostic coronary arteriography by myself on 12/24/14 via the right radial approach revealing normal coronary arteries and severe LV dysfunction with an EF of 30%. His dyspnea has significantly improved. He remains in A. Fib with a  ventricular response in the 70s today. He is on Savaysa oral anticoagulation. His carvedilol was titrated to 12.5 mg by mouth twice a day and ultimately up to 25 mg twice a day with the addition of lisinopril as well. Recent 2-D echo performed 03/04/15 revealed a persistent diminished EF in the 25-30% range although he is not symptomatic. I referred him to Dr. Royann Shivers for consideration of ICD implantation for primary prevention of sudden cardiac death. This was performed June 07, 2015 uneventfully.Since I saw him in the office last he has done well. He did develop a left axillary vein DVT back in December resulting in transitioning from a novel or oral anticoagulant to warfarin. His most recent 2-D echo performed 06/09/16 revealed an improvement in his ejection fraction from 25-30% up to 45-50%.   He had a near ICD discharge which was aborted at the last minute.  This was at the time of the colonoscopy.  Since I saw him 6 months ago he is remained well.  He is sheltering in place and socially distancing.  He has no cardiovascular complaints.  He walks 10-15,000 steps a day without limitation.  His ICD is followed noninvasively quarterly by Dr. Royann Shivers.  He did check his vital signs this morning.  His last lipid profile performed 04/08/2018 revealed total cluster 195, LDL 105 and HDL 45  The patient does not have symptoms concerning for COVID-19 infection (fever, chills, cough, or new shortness of breath).    Past Medical History:  Diagnosis Date  . AICD (automatic cardioverter/defibrillator) present 07-Jun-2015    Medtronic Evera XT VR model DVBB1D4 serial number  [id number]  . Atrial fibrillation (HCC)   . Heart failure with reduced ejection fraction, NYHA class II (HCC)    a. EF 25-30% by echo in 02/2015  . Heart palpitations   . Hyperlipidemia   . Hypertension   . Left ventricular dysfunction   . Normal coronary arteries    by outpatient diagnostic coronary arteriography performed by Dr Allyson Sabal  12/24/14.   Past Surgical History:  Procedure Laterality Date  . CARDIAC CATHETERIZATION N/A 12/24/2014   Procedure: Left Heart Cath and Coronary Angiography;  Surgeon: Runell Gess, MD;  Location: [facility] INVASIVE CV LAB;  Service: Cardiovascular;  Laterality: N/A;  . DOPPLER ECHOCARDIOGRAPHY  2012  . EP IMPLANTABLE DEVICE N/A 06/04/2015   Procedure: ICD Implant;  Surgeon: Thurmon Fair, MD; Medtronic Evera XT VR model DVBB1D4 serial number [id number]; Laterality: N/A;  . NM MYOVIEW LTD  2012     Current Meds  Medication Sig  . atorvastatin (LIPITOR) 10 MG tablet Take 10 mg by mouth at bedtime.  . carvedilol (COREG) 25 MG tablet Take 1 tablet (25 mg total) by mouth 2 (two) times daily.  . Coenzyme Q10 (CO Q-10) 100 MG CAPS Take 1 tablet by mouth daily.  [name] glucosamine-chondroitin 500-400 MG tablet Take 1 tablet by mouth 2 (two) times daily.  [name] lisinopril (PRINIVIL,ZESTRIL) 10 MG tablet Take 1 tablet (10 mg total) by mouth daily.  . Multiple Vitamin (MULTIVITAMIN) tablet Take 1 tablet by mouth daily.  . multivitamin-lutein (OCUVITE-LUTEIN) CAPS capsule Take 1 capsule by mouth daily.  [name] thyroid (ARMOUR) 90 MG tablet Take 90 mg by mouth daily.  [name] warfarin (COUMADIN) 5 MG tablet Take 1 tablet (5 mg total) by mouth daily.     Allergies:   Patient has no known allergies.   Social History   Tobacco Use  . Smoking status: Former Smoker    Last attempt to quit: 06/15/1993    Years since quitting: 25.3  . Smokeless tobacco: Never Used  Substance Use Topics  . Alcohol use: Yes    Comment: DRINKS ON OCCASION   . Drug use: No     Family Hx: The patient's family history includes Cancer in his father; Diabetes in his mother; Heart failure in his father; Hypertension in his mother.  ROS:   Please see the history of present illness.     All other systems reviewed and are negative.   Prior CV studies:   The following studies were reviewed today:  2D echocardiogram  Labs/Other Tests  and Data Reviewed:    EKG:  An ECG dated 09/22/2017 was personally reviewed today and demonstrated:  Atrial fibrillation with a ventricular spots of 86  Recent Labs: No results found for requested labs within last 8760 hours.   Recent Lipid Panel Lab Results  Component Value Date/Time   CHOL 195 04/11/2018 08:16 AM   TRIG 224 (H) 04/11/2018 08:16 AM   HDL 45 04/11/2018 08:16 AM   CHOLHDL 4.3 04/11/2018 08:16 AM   CHOLHDL 3.8 12/18/2014 09:59 AM   LDLCALC 105 (H) 04/11/2018 08:16 AM    Wt Readings from Last 3 Encounters:  10/07/18 185 lb (83.9 kg)  03/02/18 196 lb 3.2 oz (89 kg)  09/22/17 198 lb 12.8 oz (90.2 kg)     Objective:    Vital Signs:  BP 98/66   Pulse 81   Ht 6' (1.829 m)   Wt 185 lb (83.9 kg)   BMI 25.09 kg/m    VITAL SIGNS:  reviewed GEN:  no acute distress RESPIRATORY:  normal respiratory effort, symmetric expansion NEURO:  alert and oriented x 3, no obvious focal deficit PSYCH:  normal affect  ASSESSMENT & PLAN:    1. Persistent atrial fibrillation- patient remains in A. fib rate controlled on Coumadin anticoagulation which will follow in our office. 2. Nonischemic cardiomyopathy- catheterization performed 12/23/2014 radially by myself revealed normal coronary arteries and severe LV dysfunction.  Despite optimal medical therapy his EF did not improve over I prescribed time window and he ultimately underwent ICD insertion for primary prevention by Dr. Lenord Carbo 06/04/2015.  Ultimately, his EF did improve to 45 to 50% by 2D echo 06/09/2016. 3. Essential hypertension- blood pressure measured by the patient today at home was 98/66 with pulse of 80.  He is on lisinopril and carvedilol  COVID-19 Education: The signs and symptoms of COVID-19 were discussed with the patient and how to seek care for testing (follow up with PCP or arrange E-visit).  The importance of social distancing was discussed today.  Time:   Today, I have spent 8 minutes with the patient with  telehealth technology discussing the above problems.     Medication Adjustments/Labs and Tests Ordered: Current medicines are reviewed at length with the patient today.  Concerns regarding medicines are outlined above.   Tests Ordered: No orders of the defined types were placed in this encounter.   Medication Changes: No orders of the defined types were placed in this encounter.   Disposition:  Follow up in 6 month(s)  Signed, [name], MD  10/07/2018 12:14 PM    [facility]

## 2018-10-07 NOTE — Patient Instructions (Signed)
Medication Instructions:  Your physician recommends that you continue on your current medications as directed. Please refer to the Current Medication list given to you today.  If you need a refill on your cardiac medications before your next appointment, please call your pharmacy.   Lab work: NONE If you have labs (blood work) drawn today and your tests are completely normal, you will receive your results only by: . MyChart Message (if you have MyChart) OR . A paper copy in the mail If you have any lab test that is abnormal or we need to change your treatment, we will call you to review the results.  Testing/Procedures: NONE  Follow-Up: At [facility], you and your health needs are our priority.  As part of our continuing mission to provide you with exceptional heart care, we have created designated Provider Care Teams.  These Care Teams include your primary Cardiologist (physician) and Advanced Practice Providers (APPs -  Physician Assistants and Nurse Practitioners) who all work together to provide you with the care you need, when you need it. . You will need a follow up appointment in 6 months with an APP and in 12 months with Dr. Berry.  Please call our office 2 months in advance to schedule this appointment.  You may see one of the following Advanced Practice Providers on your designated Care Team:   . Luke Kilroy, PA-C . Hao Meng, PA-C . Angela Duke, PA-C . Kathryn Lawrence, DNP . Rhonda Barrett, PA-C . Krista Kroeger, PA-C . Callie Goodrich, PA-C    

## 2018-10-07 NOTE — Telephone Encounter (Signed)
Per DPR left detailed voicemail message with AVS instructions. Letter including After Visit Summary and any other necessary documents to be mailed to the patient's address on file.

## 2018-10-10 ENCOUNTER — Telehealth: Payer: Medicare Other | Admitting: Cardiovascular Disease

## 2018-10-10 ENCOUNTER — Telehealth: Payer: Self-pay | Admitting: Cardiovascular Disease

## 2018-10-10 ENCOUNTER — Telehealth: Payer: Self-pay

## 2018-10-10 NOTE — Telephone Encounter (Signed)
New Message   Patient's wife calling about husbands appointment that was cancelled for today.  Would like a nurse to call them back.

## 2018-10-10 NOTE — Telephone Encounter (Signed)
Spoke with pt who states that his recent appts with Dr. Royann Shivers were cancelled and that he wanted to know if he should reschedule

## 2018-10-10 NOTE — Telephone Encounter (Signed)
Spoke to patient virtual visit rescheduled with Dr.Croitoru  10/12/18 at 3:00 pm.Patient gave permission to do a virtual visit.

## 2018-10-10 NOTE — Telephone Encounter (Signed)

## 2018-10-11 ENCOUNTER — Ambulatory Visit (INDEPENDENT_AMBULATORY_CARE_PROVIDER_SITE_OTHER): Payer: Medicare Other | Admitting: Pharmacist

## 2018-10-11 ENCOUNTER — Telehealth: Payer: Medicare Other | Admitting: Cardiovascular Disease

## 2018-10-11 ENCOUNTER — Other Ambulatory Visit: Payer: Self-pay

## 2018-10-11 ENCOUNTER — Telehealth: Payer: Self-pay | Admitting: Cardiovascular Disease

## 2018-10-11 ENCOUNTER — Encounter: Payer: Medicare Other | Admitting: Cardiovascular Disease

## 2018-10-11 DIAGNOSIS — Z7901 Long term (current) use of anticoagulants: Secondary | ICD-10-CM | POA: Diagnosis not present

## 2018-10-11 DIAGNOSIS — I82622 Acute embolism and thrombosis of deep veins of left upper extremity: Secondary | ICD-10-CM

## 2018-10-11 DIAGNOSIS — I82A12 Acute embolism and thrombosis of left axillary vein: Secondary | ICD-10-CM | POA: Diagnosis not present

## 2018-10-11 LAB — POCT INR: INR: 3.7 — AB (ref 2.0–3.0)

## 2018-10-11 NOTE — Telephone Encounter (Signed)
Mychart, smartphone, pre reg complete 10/11/18 AF °

## 2018-10-12 ENCOUNTER — Telehealth (INDEPENDENT_AMBULATORY_CARE_PROVIDER_SITE_OTHER): Payer: Medicare Other | Admitting: Cardiovascular Disease

## 2018-10-12 ENCOUNTER — Encounter: Payer: Self-pay | Admitting: Cardiovascular Disease

## 2018-10-12 VITALS — BP 110/77 | HR 72 | Ht 72.0 in | Wt 183.0 lb

## 2018-10-12 DIAGNOSIS — I519 Heart disease, unspecified: Secondary | ICD-10-CM

## 2018-10-12 DIAGNOSIS — Z95810 Presence of automatic (implantable) cardiac defibrillator: Secondary | ICD-10-CM

## 2018-10-12 DIAGNOSIS — E782 Mixed hyperlipidemia: Secondary | ICD-10-CM

## 2018-10-12 DIAGNOSIS — I428 Other cardiomyopathies: Secondary | ICD-10-CM

## 2018-10-12 DIAGNOSIS — I4811 Longstanding persistent atrial fibrillation: Secondary | ICD-10-CM

## 2018-10-12 NOTE — Patient Instructions (Signed)
Medication Instructions:  Your physician recommends that you continue on your current medications as directed. Please refer to the Current Medication list given to you today.  If you need a refill on your cardiac medications before your next appointment, please call your pharmacy.   Follow-Up: At [facility], you and your health needs are our priority.  As part of our continuing mission to provide you with exceptional heart care, we have created designated Provider Care Teams.  These Care Teams include your primary Cardiologist (physician) and Advanced Practice Providers (APPs -  Physician Assistants and Nurse Practitioners) who all work together to provide you with the care you need, when you need it. You will need a follow up appointment in 12 months.  Please call our office 2 months in advance to schedule this appointment.  You may see Mihai Croitoru, MD or one of the following Advanced Practice Providers on your designated Care Team: Hao Meng, PA-C . Angela Duke, PA-C  Any Other Special Instructions Will Be Listed Below (If Applicable). None   

## 2018-10-13 NOTE — Progress Notes (Signed)
Virtual Visit via Video Note   This visit type was conducted due to national recommendations for restrictions regarding the COVID-19 Pandemic (e.g. social distancing) in an effort to limit this patient's exposure and mitigate transmission in our community.  Due to his co-morbid illnesses, this patient is at least at moderate risk for complications without adequate follow up.  This format is felt to be most appropriate for this patient at this time.  All issues noted in this document were discussed and addressed.  A limited physical exam was performed with this format.  Please refer to the patient's chart for his consent to telehealth for [facility]   Date:  10/13/2018   ID:  Marc Potter, DOB [birth year], MRN [id number]  Patient Location: Home Provider Location: Home  PCP:  Nonnie Done., MD  Cardiologist:  [name], MD  Electrophysiologist:  Thurmon Fair, MD   Evaluation Performed:  Follow-Up Visit  Chief Complaint:  ICD follow up  History of Present Illness:    Marc Potter is a 73 y.o. male with permanent atrial fibrillation, longstanding nonischemic cardiomyopathy and well compensated heart failure, received a primary prevention single-chamber defibrillator in 2016.  Had partial recovery of left ventricular systolic function EF now estimated at 45%.  He saw Dr. Allyson Sabal just last week and was doing well from a heart failure point of view.  We reviewed his comprehensive remote pacemaker/defibrillator download from April 17.  He asked me "if I could tell whether his heart was any stronger".  I told him that there is no direct way to evaluate left ventricular systolic function by interrogating the device, but there are many encouraging findings on the device, including normal thoracic impedance levels, good activity level stable over the past 12 months, good separation between daytime and nighttime heart rates and the absence of any ventricular arrhythmia.  Lead  parameters are excellent and the estimated generator longevity 7.5 years.  The patient specifically denies any chest pain at rest exertion, dyspnea at rest or with exertion, orthopnea, paroxysmal nocturnal dyspnea, syncope, palpitations, focal neurological deficits, intermittent claudication, lower extremity edema, unexplained weight gain, cough, hemoptysis or wheezing.  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  He no longer has any symptoms related to this problem.   The patient does not have symptoms concerning for COVID-19 infection (fever, chills, cough, or new shortness of breath).    Past Medical History:  Diagnosis Date   AICD (automatic cardioverter/defibrillator) present 06/04/2015    Medtronic Evera XT VR model DVBB1D4 serial number [id number] H   Atrial fibrillation (HCC)    Heart failure with reduced ejection fraction, NYHA class II (HCC)    a. EF 25-30% by echo in 02/2015   Heart palpitations    Hyperlipidemia    Hypertension    Left ventricular dysfunction    Normal coronary arteries    by outpatient diagnostic coronary arteriography performed by Dr Allyson Sabal 12/24/14.   Past Surgical History:  Procedure Laterality Date   CARDIAC CATHETERIZATION N/A 12/24/2014   Procedure: Left Heart Cath and Coronary Angiography;  Surgeon: Runell Gess, MD;  Location: [facility] INVASIVE CV LAB;  Service: Cardiovascular;  Laterality: N/A;   DOPPLER ECHOCARDIOGRAPHY  2012   EP IMPLANTABLE DEVICE N/A 06/04/2015   Procedure: ICD Implant;  Surgeon: Thurmon Fair, MD; Medtronic Byron VR model DVBB1D4 serial number [id number]; Laterality: N/A;   NM MYOVIEW LTD  2012  Current Meds  Medication Sig   atorvastatin (LIPITOR) 10 MG tablet Take 10 mg by mouth at bedtime.   calcium-vitamin D (OSCAL WITH D) 500-200 MG-UNIT tablet Take 1 tablet by mouth.   carvedilol (COREG) 25 MG tablet Take  1 tablet (25 mg total) by mouth 2 (two) times daily.   Coenzyme Q10 (CO Q-10) 100 MG CAPS Take 1 tablet by mouth daily.   glucosamine-chondroitin 500-400 MG tablet Take 1 tablet by mouth 2 (two) times daily.   lisinopril (PRINIVIL,ZESTRIL) 10 MG tablet Take 1 tablet (10 mg total) by mouth daily.   Multiple Vitamin (MULTIVITAMIN) tablet Take 1 tablet by mouth daily.   multivitamin-lutein (OCUVITE-LUTEIN) CAPS capsule Take 1 capsule by mouth daily.   thyroid (ARMOUR) 90 MG tablet Take 90 mg by mouth daily.   warfarin (COUMADIN) 5 MG tablet Take 1 tablet (5 mg total) by mouth daily.     Allergies:   Patient has no known allergies.   Social History   Tobacco Use   Smoking status: Former Smoker    Last attempt to quit: 06/15/1993    Years since quitting: 25.3   Smokeless tobacco: Never Used  Substance Use Topics   Alcohol use: Yes    Comment: DRINKS ON OCCASION    Drug use: No     Family Hx: The patient's family history includes Cancer in his father; Diabetes in his mother; Heart failure in his father; Hypertension in his mother.  ROS:   Please see the history of present illness.     All other systems reviewed and are negative.   Prior CV studies:   The following studies were reviewed today: Comprehensive pacemaker download September 30 2018  Labs/Other Tests and Data Reviewed:    EKG:  An ECG dated September 22, 2017 was personally reviewed today and demonstrated:  Atrial fibrillation with controlled ventricular response and delayed anterior R wave progression  Recent Labs: No results found for requested labs within last 8760 hours.   Recent Lipid Panel Lab Results  Component Value Date/Time   CHOL 195 04/11/2018 08:16 AM   TRIG 224 (H) 04/11/2018 08:16 AM   HDL 45 04/11/2018 08:16 AM   CHOLHDL 4.3 04/11/2018 08:16 AM   CHOLHDL 3.8 12/18/2014 09:59 AM   LDLCALC 105 (H) 04/11/2018 08:16 AM    Wt Readings from Last 3 Encounters:  10/12/18 183 lb (83 kg)  10/07/18  185 lb (83.9 kg)  03/02/18 196 lb 3.2 oz (89 kg)     Objective:    Vital Signs:  BP 110/77    Pulse 72    Ht 6' (1.829 m)    Wt 183 lb (83 kg)    BMI 24.82 kg/m    VITAL SIGNS:  reviewed GEN:  no acute distress EYES:  sclerae anicteric, EOMI - Extraocular Movements Intact RESPIRATORY:  normal respiratory effort, symmetric expansion CARDIOVASCULAR:  no peripheral edema SKIN:  no rash, lesions or ulcers. MUSCULOSKELETAL:  no obvious deformities. NEURO:  alert and oriented x 3, no obvious focal deficit PSYCH:  normal affect  ASSESSMENT & PLAN:    1. AFib: Rate well controlled.  Appropriately anticoagulated. 2. ICD: Normal device function.  Continue remote downloads every 3 months. 3. NICMP: Well compensated heart failure with no need for loop diuretics.  On maximum dose carvedilol and lisinopril.  NYHA functional class I, not sure that switching to Sherryll Burger is indicated, especially with improvement in LVEF. 4. Anticoagulation: Well-tolerated, no bleeding problems  COVID-19 Education: The signs and  symptoms of COVID-19 were discussed with the patient and how to seek care for testing (follow up with PCP or arrange E-visit).  The importance of social distancing was discussed today.  Time:   Today, I have spent 15 minutes with the patient with telehealth technology discussing the above problems.     Medication Adjustments/Labs and Tests Ordered: Current medicines are reviewed at length with the patient today.  Concerns regarding medicines are outlined above.   Tests Ordered: No orders of the defined types were placed in this encounter.   Medication Changes: No orders of the defined types were placed in this encounter.   Disposition:  Follow up 12 months  Signed, Thurmon FairMihai Arlette Schaad, MD  10/13/2018 7:19 PM    [facility]

## 2018-11-01 ENCOUNTER — Telehealth: Payer: Self-pay

## 2018-11-01 ENCOUNTER — Other Ambulatory Visit: Payer: Self-pay

## 2018-11-01 NOTE — Telephone Encounter (Signed)
Left message for patient to remind of missed remote transmission.  

## 2018-11-01 NOTE — Telephone Encounter (Signed)
lmom for prescreen and reschedule 

## 2018-11-02 NOTE — Telephone Encounter (Signed)

## 2018-11-03 ENCOUNTER — Ambulatory Visit (INDEPENDENT_AMBULATORY_CARE_PROVIDER_SITE_OTHER): Payer: Medicare Other | Admitting: Pharmacist

## 2018-11-03 ENCOUNTER — Other Ambulatory Visit: Payer: Self-pay

## 2018-11-03 DIAGNOSIS — I82A12 Acute embolism and thrombosis of left axillary vein: Secondary | ICD-10-CM | POA: Diagnosis not present

## 2018-11-03 LAB — POCT INR: INR: 3.5 — AB (ref 2.0–3.0)

## 2018-11-03 NOTE — Patient Instructions (Signed)
Description   Spoke with pt and instructed pt to hold warfarin dose today ONLY, then change dose 1 tablet daily except 1.5 tablets each Monday.  Repeat INR in 2 weeks (usually 6 weeks).

## 2018-11-08 NOTE — Progress Notes (Signed)
No ICM remote transmission received for 11/01/2018 and next ICM transmission scheduled for 12/07/2018.

## 2018-11-11 ENCOUNTER — Telehealth: Payer: Self-pay

## 2018-11-11 NOTE — Telephone Encounter (Signed)

## 2018-11-14 ENCOUNTER — Other Ambulatory Visit: Payer: Self-pay

## 2018-11-14 ENCOUNTER — Ambulatory Visit (INDEPENDENT_AMBULATORY_CARE_PROVIDER_SITE_OTHER): Payer: Medicare Other | Admitting: *Deleted

## 2018-11-14 DIAGNOSIS — Z5181 Encounter for therapeutic drug level monitoring: Secondary | ICD-10-CM

## 2018-11-14 DIAGNOSIS — I82A12 Acute embolism and thrombosis of left axillary vein: Secondary | ICD-10-CM

## 2018-11-14 LAB — POCT INR: INR: 2.7 (ref 2.0–3.0)

## 2018-11-14 NOTE — Patient Instructions (Signed)
Description   Continue taking  1 tablet daily except 1.5 tablets every Friday.   Repeat INR in 4 weeks.      

## 2018-12-05 ENCOUNTER — Telehealth: Payer: Self-pay

## 2018-12-05 NOTE — Telephone Encounter (Signed)
lmom for prescreen  

## 2018-12-06 ENCOUNTER — Telehealth: Payer: Self-pay | Admitting: Cardiovascular Disease

## 2018-12-06 NOTE — Telephone Encounter (Signed)
New Message ° ° ° °Pt is returning call  ° ° ° °Please call back  °

## 2018-12-06 NOTE — Telephone Encounter (Signed)

## 2018-12-12 ENCOUNTER — Other Ambulatory Visit: Payer: Self-pay

## 2018-12-12 ENCOUNTER — Ambulatory Visit (INDEPENDENT_AMBULATORY_CARE_PROVIDER_SITE_OTHER): Payer: Medicare Other | Admitting: *Deleted

## 2018-12-12 DIAGNOSIS — Z5181 Encounter for therapeutic drug level monitoring: Secondary | ICD-10-CM | POA: Diagnosis not present

## 2018-12-12 DIAGNOSIS — I82A12 Acute embolism and thrombosis of left axillary vein: Secondary | ICD-10-CM

## 2018-12-12 LAB — POCT INR: INR: 2.3 (ref 2.0–3.0)

## 2018-12-12 NOTE — Patient Instructions (Signed)
Description   Continue taking  1 tablet daily except 1.5 tablets every Friday.   Repeat INR in 4 weeks.

## 2018-12-14 NOTE — Progress Notes (Signed)
No ICM remote transmission received for 12/07/2018 and next ICM transmission scheduled for 12/30/2018.

## 2018-12-29 ENCOUNTER — Ambulatory Visit (INDEPENDENT_AMBULATORY_CARE_PROVIDER_SITE_OTHER): Payer: Medicare Other | Admitting: *Deleted

## 2018-12-29 DIAGNOSIS — I428 Other cardiomyopathies: Secondary | ICD-10-CM | POA: Diagnosis not present

## 2018-12-30 ENCOUNTER — Telehealth: Payer: Self-pay

## 2018-12-30 LAB — CUP PACEART REMOTE DEVICE CHECK
Battery Remaining Longevity: 92 mo
Battery Voltage: 2.98 V
Brady Statistic RV Percent Paced: 0.11 %
Date Time Interrogation Session: 20200717133105
HighPow Impedance: 77 Ohm
Implantable Lead Implant Date: 20161220
Implantable Lead Location: 753860
Implantable Pulse Generator Implant Date: 20161220
Lead Channel Impedance Value: 342 Ohm
Lead Channel Impedance Value: 399 Ohm
Lead Channel Pacing Threshold Amplitude: 0.75 V
Lead Channel Pacing Threshold Pulse Width: 0.4 ms
Lead Channel Sensing Intrinsic Amplitude: 12.625 mV
Lead Channel Sensing Intrinsic Amplitude: 12.625 mV
Lead Channel Setting Pacing Amplitude: 2.5 V
Lead Channel Setting Pacing Pulse Width: 0.4 ms
Lead Channel Setting Sensing Sensitivity: 0.3 mV

## 2018-12-30 NOTE — Telephone Encounter (Signed)
Left message for patient to remind of missed remote transmission.  

## 2019-01-06 ENCOUNTER — Encounter: Payer: Self-pay | Admitting: Cardiology

## 2019-01-06 NOTE — Progress Notes (Signed)
Remote ICD transmission.   

## 2019-01-11 ENCOUNTER — Ambulatory Visit (INDEPENDENT_AMBULATORY_CARE_PROVIDER_SITE_OTHER): Payer: Medicare Other | Admitting: Pharmacist

## 2019-01-11 ENCOUNTER — Other Ambulatory Visit: Payer: Self-pay

## 2019-01-11 DIAGNOSIS — Z7901 Long term (current) use of anticoagulants: Secondary | ICD-10-CM | POA: Diagnosis not present

## 2019-01-11 DIAGNOSIS — I82A12 Acute embolism and thrombosis of left axillary vein: Secondary | ICD-10-CM

## 2019-01-11 LAB — POCT INR: INR: 1.9 — AB (ref 2.0–3.0)

## 2019-01-13 NOTE — Progress Notes (Unsigned)
No ICM remote transmission received for 12/30/2018 and next ICM transmission scheduled for 01/30/2019.

## 2019-01-30 ENCOUNTER — Ambulatory Visit (INDEPENDENT_AMBULATORY_CARE_PROVIDER_SITE_OTHER): Payer: Medicare Other

## 2019-01-30 DIAGNOSIS — I5022 Chronic systolic (congestive) heart failure: Secondary | ICD-10-CM

## 2019-01-30 DIAGNOSIS — Z95810 Presence of automatic (implantable) cardiac defibrillator: Secondary | ICD-10-CM

## 2019-02-02 ENCOUNTER — Telehealth: Payer: Self-pay | Admitting: Cardiovascular Disease

## 2019-02-02 NOTE — Telephone Encounter (Signed)
New message:    Patient calling because he is looking for a machine for his INR patient has not heard from anyone.

## 2019-02-02 NOTE — Telephone Encounter (Signed)
LMOM for patient that INR meter is in processing at [employer].  He can call them to speed up the process, but usually takes 2-3 weeks.

## 2019-02-03 ENCOUNTER — Telehealth: Payer: Self-pay

## 2019-02-03 NOTE — Progress Notes (Signed)
EPIC Encounter for ICM Monitoring  Patient Name: Marc Potter is a 73 y.o. male Date: 02/03/2019 Primary Care Physican: Enid Skeens., MD Primary Cardiologist:Berry Electrophysiologist: Croitoru LastWeight:190lbs   Attempted call to patient and unable to reach.  Left detailed message per DPR regarding transmission. Transmission reviewed.   Optivol thoracic impedance normal.  Prescribed dosage:Nodiuretic  Recommendations:Left voice mail with ICM number and encouraged to call if experiencing any fluid symptoms.  Follow-up plan: ICM clinic phone appointment on10/16/2020.  Next 91 day device clinic transmission 03/30/2019.  Copy of ICM check sent to Dr.Croitoru.   3 month ICM trend: 01/30/2019    1 Year ICM trend:       Rosalene Billings, RN 02/03/2019 11:31 AM

## 2019-02-03 NOTE — Telephone Encounter (Signed)
Remote ICM transmission received.  Attempted call to patient regarding ICM remote transmission and left detailed message, per DPR, with next ICM remote transmission date of 03/31/2019.  Advised to return call for any fluid symptoms or questions.

## 2019-02-09 ENCOUNTER — Other Ambulatory Visit: Payer: Self-pay

## 2019-02-09 ENCOUNTER — Ambulatory Visit (INDEPENDENT_AMBULATORY_CARE_PROVIDER_SITE_OTHER): Payer: Medicare Other | Admitting: Pharmacist Clinician (PhC)/ Clinical Pharmacy Specialist

## 2019-02-09 ENCOUNTER — Telehealth: Payer: Self-pay | Admitting: Pharmacist

## 2019-02-09 DIAGNOSIS — I82A12 Acute embolism and thrombosis of left axillary vein: Secondary | ICD-10-CM | POA: Diagnosis not present

## 2019-02-09 LAB — POCT INR: INR: 2 (ref 2.0–3.0)

## 2019-02-09 NOTE — Telephone Encounter (Signed)
Dana from [employer] called with a home meter update. Pt has been approved and is pending home INR training. They do not have a date set up yet but are hoping within the next week. He will be able to begin self testing after.

## 2019-02-11 LAB — POCT INR: INR: 2.1 (ref 2.0–3.0)

## 2019-02-13 ENCOUNTER — Ambulatory Visit (INDEPENDENT_AMBULATORY_CARE_PROVIDER_SITE_OTHER): Payer: Medicare Other | Admitting: Pharmacist

## 2019-02-13 DIAGNOSIS — I82622 Acute embolism and thrombosis of deep veins of left upper extremity: Secondary | ICD-10-CM

## 2019-02-24 ENCOUNTER — Ambulatory Visit (INDEPENDENT_AMBULATORY_CARE_PROVIDER_SITE_OTHER): Payer: Medicare Other | Admitting: Pharmacist

## 2019-02-24 DIAGNOSIS — I82622 Acute embolism and thrombosis of deep veins of left upper extremity: Secondary | ICD-10-CM | POA: Diagnosis not present

## 2019-02-24 DIAGNOSIS — Z7901 Long term (current) use of anticoagulants: Secondary | ICD-10-CM

## 2019-02-24 LAB — POCT INR: INR: 2 (ref 2.0–3.0)

## 2019-03-07 ENCOUNTER — Other Ambulatory Visit: Payer: Self-pay | Admitting: Cardiovascular Disease

## 2019-03-10 ENCOUNTER — Ambulatory Visit (INDEPENDENT_AMBULATORY_CARE_PROVIDER_SITE_OTHER): Payer: Medicare Other | Admitting: Pharmacist

## 2019-03-10 DIAGNOSIS — I82622 Acute embolism and thrombosis of deep veins of left upper extremity: Secondary | ICD-10-CM

## 2019-03-10 DIAGNOSIS — Z7901 Long term (current) use of anticoagulants: Secondary | ICD-10-CM | POA: Diagnosis not present

## 2019-03-10 LAB — POCT INR: INR: 1.9 — AB (ref 2.0–3.0)

## 2019-03-21 ENCOUNTER — Other Ambulatory Visit: Payer: Self-pay | Admitting: Cardiovascular Disease

## 2019-03-21 DIAGNOSIS — Z79899 Other long term (current) drug therapy: Secondary | ICD-10-CM

## 2019-03-21 DIAGNOSIS — I10 Essential (primary) hypertension: Secondary | ICD-10-CM

## 2019-03-21 DIAGNOSIS — I519 Heart disease, unspecified: Secondary | ICD-10-CM

## 2019-03-21 DIAGNOSIS — I482 Chronic atrial fibrillation, unspecified: Secondary | ICD-10-CM

## 2019-03-24 ENCOUNTER — Ambulatory Visit (INDEPENDENT_AMBULATORY_CARE_PROVIDER_SITE_OTHER): Payer: Medicare Other | Admitting: Pharmacist Clinician (PhC)/ Clinical Pharmacy Specialist

## 2019-03-24 DIAGNOSIS — Z7901 Long term (current) use of anticoagulants: Secondary | ICD-10-CM | POA: Diagnosis not present

## 2019-03-24 DIAGNOSIS — I82622 Acute embolism and thrombosis of deep veins of left upper extremity: Secondary | ICD-10-CM | POA: Diagnosis not present

## 2019-03-24 LAB — POCT INR: INR: 2.8 (ref 2.0–3.0)

## 2019-03-30 ENCOUNTER — Ambulatory Visit (INDEPENDENT_AMBULATORY_CARE_PROVIDER_SITE_OTHER): Payer: Medicare Other | Admitting: *Deleted

## 2019-03-30 DIAGNOSIS — I428 Other cardiomyopathies: Secondary | ICD-10-CM | POA: Diagnosis not present

## 2019-03-30 DIAGNOSIS — I4811 Longstanding persistent atrial fibrillation: Secondary | ICD-10-CM

## 2019-03-30 LAB — CUP PACEART REMOTE DEVICE CHECK
Battery Remaining Longevity: 88 mo
Battery Voltage: 2.97 V
Brady Statistic RV Percent Paced: 0.14 %
Date Time Interrogation Session: 20201015131653
HighPow Impedance: 76 Ohm
Implantable Lead Implant Date: 20161220
Implantable Lead Location: 753860
Implantable Pulse Generator Implant Date: 20161220
Lead Channel Impedance Value: 380 Ohm
Lead Channel Impedance Value: 437 Ohm
Lead Channel Pacing Threshold Amplitude: 0.875 V
Lead Channel Pacing Threshold Pulse Width: 0.4 ms
Lead Channel Sensing Intrinsic Amplitude: 13.125 mV
Lead Channel Sensing Intrinsic Amplitude: 13.125 mV
Lead Channel Setting Pacing Amplitude: 2.5 V
Lead Channel Setting Pacing Pulse Width: 0.4 ms
Lead Channel Setting Sensing Sensitivity: 0.3 mV

## 2019-03-31 ENCOUNTER — Ambulatory Visit (INDEPENDENT_AMBULATORY_CARE_PROVIDER_SITE_OTHER): Payer: Medicare Other

## 2019-03-31 DIAGNOSIS — I5022 Chronic systolic (congestive) heart failure: Secondary | ICD-10-CM

## 2019-03-31 DIAGNOSIS — Z95810 Presence of automatic (implantable) cardiac defibrillator: Secondary | ICD-10-CM

## 2019-04-03 ENCOUNTER — Telehealth: Payer: Self-pay

## 2019-04-03 NOTE — Progress Notes (Signed)
EPIC Encounter for ICM Monitoring  Patient Name: Zenon Leaf is a 73 y.o. male Date: 04/03/2019 Primary Care Physican: Enid Skeens., MD Primary Cardiologist:Berry Electrophysiologist: Croitoru LastWeight:190lbs   Attempted call to patient and unable to reach.  Left detailed message per DPR regarding transmission. Transmission reviewed.   Optivol thoracic impedance normal.  Prescribed dosage:Nodiuretic  Recommendations: Left voice mail with ICM number and encouraged to call if experiencing any fluid symptoms.  Follow-up plan: ICM clinic phone appointment on 05/02/2019.   91 day device clinic remote transmission 06/29/2019.  Office appt 04/18/2019 with Dr. Gwenlyn Found.    Copy of ICM check sent to Dr. Sallyanne Kuster.   3 month ICM trend: 03/30/2019    1 Year ICM trend:       Rosalene Billings, RN 04/03/2019 12:53 PM

## 2019-04-03 NOTE — Progress Notes (Signed)
Ty!

## 2019-04-03 NOTE — Telephone Encounter (Signed)
Remote ICM transmission received.  Attempted call to patient regarding ICM remote transmission and left detailed message per DPR.  Advised to return call for any fluid symptoms or questions. Next ICM remote transmission scheduled 05/02/2019.

## 2019-04-05 ENCOUNTER — Ambulatory Visit (INDEPENDENT_AMBULATORY_CARE_PROVIDER_SITE_OTHER): Payer: Medicare Other | Admitting: Pharmacist

## 2019-04-05 DIAGNOSIS — I82622 Acute embolism and thrombosis of deep veins of left upper extremity: Secondary | ICD-10-CM

## 2019-04-05 DIAGNOSIS — Z7901 Long term (current) use of anticoagulants: Secondary | ICD-10-CM | POA: Diagnosis not present

## 2019-04-05 LAB — POCT INR: INR: 2.5 (ref 2.0–3.0)

## 2019-04-14 ENCOUNTER — Ambulatory Visit: Payer: Medicare Other | Admitting: Cardiovascular Disease

## 2019-04-14 NOTE — Progress Notes (Signed)
Remote ICD transmission.   

## 2019-04-18 ENCOUNTER — Encounter: Payer: Self-pay | Admitting: Cardiovascular Disease

## 2019-04-18 ENCOUNTER — Other Ambulatory Visit: Payer: Self-pay

## 2019-04-18 ENCOUNTER — Ambulatory Visit (INDEPENDENT_AMBULATORY_CARE_PROVIDER_SITE_OTHER): Payer: Medicare Other | Admitting: Cardiovascular Disease

## 2019-04-18 VITALS — BP 121/79 | HR 86 | Temp 96.8°F | Ht 72.0 in | Wt 196.2 lb

## 2019-04-18 DIAGNOSIS — I5022 Chronic systolic (congestive) heart failure: Secondary | ICD-10-CM

## 2019-04-18 DIAGNOSIS — I429 Cardiomyopathy, unspecified: Secondary | ICD-10-CM | POA: Diagnosis not present

## 2019-04-18 DIAGNOSIS — I4811 Longstanding persistent atrial fibrillation: Secondary | ICD-10-CM

## 2019-04-18 DIAGNOSIS — I255 Ischemic cardiomyopathy: Secondary | ICD-10-CM | POA: Diagnosis not present

## 2019-04-18 NOTE — Assessment & Plan Note (Signed)
History of ICD implantation by Dr. Sallyanne Kuster at my request for primary prevention.  He had an aborted discharge but has never actually had a treatment delivered.

## 2019-04-18 NOTE — Progress Notes (Signed)
04/18/2019 Marc Potter   01-Sep-1945  [id number]  Primary Physician Wendie Agreste, Marshall Cork., MD Primary Cardiologist: Lorretta Harp MD Lupe Carney, [place]  HPI:  Marc Potter is a 73 y.o.  moderately overweight married Caucasian male (wife Marc Potter) father of one biologic and 2 stepchildren, grandfather and 6 grandchildren whose mother-in-law, Marc Potter , is a patient of mine as well, who unfortunately died November 21, 2017 of lung cancer.. I last saw him   virtually 10/07/2018.Marland KitchenMarland KitchenHe has history of hypertension and hyperlipidemia. He is a normal 2-D echo Myoview in the past. He had PAF on event monitoring she was symptomatic from it at that point several years ago I elected to begin him on aspirin alone. He does admit to mild increasing dyspnea on exertion but denies chest pain. He underwent outpatient diagnostic coronary arteriography by myself on 12/24/14 via the right radial approach revealing normal coronary arteries and severe LV dysfunction with an EF of 30%. His dyspnea has significantly improved. He remains in A. Fib with a ventricular response in the 70s today. He is on Savaysa oral anticoagulation. His carvedilol was titrated to 12.5 mg by mouth twice a day and ultimately up to 25 mg twice a day with the addition of lisinopril as well. Recent 2-D echo performed 03/04/15 revealed a persistent diminished EF in the 25-30% range although he is not symptomatic. I referred him to Dr. Sallyanne Kuster for consideration of ICD implantation for primary prevention of sudden cardiac death. This was performed 2015-07-02 uneventfully.Since I saw him in the office last he has done well. He did develop a left axillary vein DVT back in December resulting in transitioning from a novel or oral anticoagulant to warfarin. His most recent 2-D echo performed 06/09/16 revealed an improvement in his ejection fraction from 25-30% up to 45-50%.   He had a near ICD discharge which was aborted at the last minute.  This was at  the time of the colonoscopy.  Since I saw him 6 months ago he is remained well.  He is sheltering in place and socially distancing.  He has no cardiovascular complaints.  He walks 10-15,000 steps a day without limitation.  His ICD is followed noninvasively quarterly by Dr. Sallyanne Kuster.  He did check his vital signs this morning.  His last lipid profile performed 04/08/2018 revealed total cluster 195, LDL 105 and HDL 45   Current Meds  Medication Sig  . atorvastatin (LIPITOR) 10 MG tablet Take 10 mg by mouth at bedtime.  . calcium-vitamin D (OSCAL WITH D) 500-200 MG-UNIT tablet Take 1 tablet by mouth.  . carvedilol (COREG) 25 MG tablet TAKE 1 TABLET TWICE A DAY  . Coenzyme Q10 (CO Q-10) 100 MG CAPS Take 1 tablet by mouth daily.  [name] glucosamine-chondroitin 500-400 MG tablet Take 1 tablet by mouth 2 (two) times daily.  [name] lisinopril (ZESTRIL) 10 MG tablet TAKE 1 TABLET DAILY  . Multiple Vitamin (MULTIVITAMIN) tablet Take 1 tablet by mouth daily.  . multivitamin-lutein (OCUVITE-LUTEIN) CAPS capsule Take 1 capsule by mouth daily.  [name] thyroid (ARMOUR) 90 MG tablet Take 90 mg by mouth daily.  [name] warfarin (COUMADIN) 5 MG tablet Take 1 tablet (5 mg total) by mouth daily.     No Known Allergies  Social History   Socioeconomic History  . Marital status: Married    Spouse name: Not on file  . Number of children: Not on file  . Years of education: Not on file  . Highest education level: Not on  file  Occupational History  . Not on file  Social Needs  . Financial resource strain: Not on file  . Food insecurity    Worry: Not on file    Inability: Not on file  . Transportation needs    Medical: Not on file    Non-medical: Not on file  Tobacco Use  . Smoking status: Former Smoker    Quit date: 06/15/1993    Years since quitting: 25.8  . Smokeless tobacco: Never Used  Substance and Sexual Activity  . Alcohol use: Yes    Comment: DRINKS ON OCCASION   . Drug use: No  . Sexual activity: Not on file   Lifestyle  . Physical activity    Days per week: Not on file    Minutes per session: Not on file  . Stress: Not on file  Relationships  . Social Musician on phone: Not on file    Gets together: Not on file    Attends religious service: Not on file    Active member of club or organization: Not on file    Attends meetings of clubs or organizations: Not on file    Relationship status: Not on file  . Intimate partner violence    Fear of current or ex partner: Not on file    Emotionally abused: Not on file    Physically abused: Not on file    Forced sexual activity: Not on file  Other Topics Concern  . Not on file  Social History Narrative  . Not on file     Review of Systems: General: negative for chills, fever, night sweats or weight changes.  Cardiovascular: negative for chest pain, dyspnea on exertion, edema, orthopnea, palpitations, paroxysmal nocturnal dyspnea or shortness of breath Dermatological: negative for rash Respiratory: negative for cough or wheezing Urologic: negative for hematuria Abdominal: negative for nausea, vomiting, diarrhea, bright red blood per rectum, melena, or hematemesis Neurologic: negative for visual changes, syncope, or dizziness All other systems reviewed and are otherwise negative except as noted above.    Blood pressure 121/79, pulse 86, temperature (!) 96.8 F (36 C), height 6' (1.829 m), weight 196 lb 3.2 oz (89 kg), SpO2 97 %.  General appearance: alert and no distress Neck: no adenopathy, no carotid bruit, no JVD, supple, symmetrical, trachea midline and thyroid not enlarged, symmetric, no tenderness/mass/nodules Lungs: clear to auscultation bilaterally Heart: irregularly irregular rhythm Extremities: extremities normal, atraumatic, no cyanosis or edema Pulses: 2+ and symmetric Skin: Skin color, texture, turgor normal. No rashes or lesions Neurologic: Alert and oriented X 3, normal strength and tone. Normal symmetric  reflexes. Normal coordination and gait  EKG atrial fibrillation with a ventricular spots of 77, low limb voltage and septal Q waves.  I personally reviewed this EKG.  ASSESSMENT AND PLAN:   Essential hypertension History of essential hypertension with blood pressure measured today 121/79.  He is on carvedilol and lisinopril  Hyperlipidemia History of hyperlipidemia on statin therapy with lipid profile performed 04/03/2018 revealing total cholesterol 95, LDL 105 and HDL 45  Atrial fibrillation (HCC) History of atrial fibrillation rate controlled on Coumadin anticoagulation  Left ventricular dysfunction History of left ventricular dysfunction and normal coronary arteries.  He has nonischemic cardiomyopathy on medical therapy with an EF that improved from 30% originally up to 45 to 50% by 2D echo 06/07/2016.  He denies symptoms of heart failure.  I am going to recheck a 2D echocardiogram.  ICD (implantable cardioverter-defibrillator) in place History  of ICD implantation by Dr. Royann Shivers at my request for primary prevention.  He had an aborted discharge but has never actually had a treatment delivered.      Runell Gess MD FACP,FACC,FAHA, [facility] 04/18/2019 9:39 AM

## 2019-04-18 NOTE — Assessment & Plan Note (Signed)
History of hyperlipidemia on statin therapy with lipid profile performed 04/03/2018 revealing total cholesterol 95, LDL 105 and HDL 45

## 2019-04-18 NOTE — Assessment & Plan Note (Signed)
History of atrial fibrillation rate controlled on Coumadin anticoagulation. 

## 2019-04-18 NOTE — Patient Instructions (Signed)
Medication Instructions:  Your physician recommends that you continue on your current medications as directed. Please refer to the Current Medication list given to you today.  If you need a refill on your cardiac medications before your next appointment, please call your pharmacy.   Lab work: NONE If you have labs (blood work) drawn today and your tests are completely normal, you will receive your results only by: [place] (if you have MyChart) OR A paper copy in the mail If you have any lab test that is abnormal or we need to change your treatment, we will call you to review the results.  Testing/Procedures: Your physician has requested that you have an echocardiogram. Echocardiography is a painless test that uses sound waves to create images of your heart. It provides your doctor with information about the size and shape of your heart and how well your heart's chambers and valves are working. This procedure takes approximately one hour. There are no restrictions for this procedure. [place] [address]   Follow-Up: At [facility], you and your health needs are our priority.  As part of our continuing mission to provide you with exceptional heart care, we have created designated Provider Care Teams.  These Care Teams include your primary Cardiologist (physician) and Advanced Practice Providers (APPs -  Physician Assistants and Nurse Practitioners) who all work together to provide you with the care you need, when you need it. You may see Quay Burow, MD or one of the following Advanced Practice Providers on your designated Care Team:    Kerin Ransom, PA-C  [place], [place]  Coletta Memos, [place] Your physician wants you to follow-up in: 1 year. You will receive a reminder letter in the mail two months in advance. If you don't receive a letter, please call our office to schedule the follow-up appointment.   Any Other Special Instructions Will Be Listed Below (If  Applicable).  Echocardiogram An echocardiogram is a procedure that uses painless sound waves (ultrasound) to produce an image of the heart. Images from an echocardiogram can provide important information about:  Signs of coronary artery disease (CAD).  Aneurysm detection. An aneurysm is a weak or damaged part of an artery wall that bulges out from the normal force of blood pumping through the body.  Heart size and shape. Changes in the size or shape of the heart can be associated with certain conditions, including heart failure, aneurysm, and CAD.  Heart muscle function.  Heart valve function.  Signs of a past heart attack.  Fluid buildup around the heart.  Thickening of the heart muscle.  A tumor or infectious growth around the heart valves. Tell a health care provider about:  Any allergies you have.  All medicines you are taking, including vitamins, herbs, eye drops, creams, and over-the-counter medicines.  Any blood disorders you have.  Any surgeries you have had.  Any medical conditions you have.  Whether you are pregnant or may be pregnant. What are the risks? Generally, this is a safe procedure. However, problems may occur, including:  Allergic reaction to dye (contrast) that may be used during the procedure. What happens before the procedure? No specific preparation is needed. You may eat and drink normally. What happens during the procedure?   An IV tube may be inserted into one of your veins.  You may receive contrast through this tube. A contrast is an injection that improves the quality of the pictures from your heart.  A gel will  be applied to your chest.  A wand-like tool (transducer) will be moved over your chest. The gel will help to transmit the sound waves from the transducer.  The sound waves will harmlessly bounce off of your heart to allow the heart images to be captured in real-time motion. The images will be recorded on a computer. The  procedure may vary among health care providers and hospitals. What happens after the procedure?  You may return to your normal, everyday life, including diet, activities, and medicines, unless your health care provider tells you not to do that. Summary  An echocardiogram is a procedure that uses painless sound waves (ultrasound) to produce an image of the heart.  Images from an echocardiogram can provide important information about the size and shape of your heart, heart muscle function, heart valve function, and fluid buildup around your heart.  You do not need to do anything to prepare before this procedure. You may eat and drink normally.  After the echocardiogram is completed, you may return to your normal, everyday life, unless your health care provider tells you not to do that. This information is not intended to replace advice given to you by your health care provider. Make sure you discuss any questions you have with your health care provider. Document Released: 05/29/2000 Document Revised: 09/22/2018 Document Reviewed: 07/04/2016 Elsevier Patient Education  2020 [employer].

## 2019-04-18 NOTE — Assessment & Plan Note (Signed)
History of left ventricular dysfunction and normal coronary arteries.  He has nonischemic cardiomyopathy on medical therapy with an EF that improved from 30% originally up to 45 to 50% by 2D echo 06/07/2016.  He denies symptoms of heart failure.  I am going to recheck a 2D echocardiogram.

## 2019-04-18 NOTE — Assessment & Plan Note (Signed)
History of essential hypertension with blood pressure measured today 121/79.  He is on carvedilol and lisinopril

## 2019-04-19 ENCOUNTER — Ambulatory Visit (INDEPENDENT_AMBULATORY_CARE_PROVIDER_SITE_OTHER): Payer: Medicare Other | Admitting: Pharmacist

## 2019-04-19 DIAGNOSIS — I82622 Acute embolism and thrombosis of deep veins of left upper extremity: Secondary | ICD-10-CM

## 2019-04-19 DIAGNOSIS — Z7901 Long term (current) use of anticoagulants: Secondary | ICD-10-CM | POA: Diagnosis not present

## 2019-04-19 LAB — POCT INR: INR: 2.4 (ref 2.0–3.0)

## 2019-04-21 ENCOUNTER — Other Ambulatory Visit: Payer: Self-pay

## 2019-04-21 ENCOUNTER — Ambulatory Visit (HOSPITAL_COMMUNITY): Payer: Medicare Other | Attending: Cardiology

## 2019-04-21 DIAGNOSIS — I429 Cardiomyopathy, unspecified: Secondary | ICD-10-CM | POA: Insufficient documentation

## 2019-04-21 DIAGNOSIS — I5022 Chronic systolic (congestive) heart failure: Secondary | ICD-10-CM | POA: Insufficient documentation

## 2019-04-21 DIAGNOSIS — I4811 Longstanding persistent atrial fibrillation: Secondary | ICD-10-CM

## 2019-05-02 ENCOUNTER — Ambulatory Visit (INDEPENDENT_AMBULATORY_CARE_PROVIDER_SITE_OTHER): Payer: Medicare Other

## 2019-05-02 DIAGNOSIS — I5022 Chronic systolic (congestive) heart failure: Secondary | ICD-10-CM

## 2019-05-02 DIAGNOSIS — Z95810 Presence of automatic (implantable) cardiac defibrillator: Secondary | ICD-10-CM | POA: Diagnosis not present

## 2019-05-03 ENCOUNTER — Ambulatory Visit (INDEPENDENT_AMBULATORY_CARE_PROVIDER_SITE_OTHER): Payer: Medicare Other | Admitting: Pharmacist

## 2019-05-03 DIAGNOSIS — I4811 Longstanding persistent atrial fibrillation: Secondary | ICD-10-CM | POA: Diagnosis not present

## 2019-05-03 DIAGNOSIS — I82622 Acute embolism and thrombosis of deep veins of left upper extremity: Secondary | ICD-10-CM | POA: Diagnosis not present

## 2019-05-03 DIAGNOSIS — Z7901 Long term (current) use of anticoagulants: Secondary | ICD-10-CM | POA: Diagnosis not present

## 2019-05-03 LAB — POCT INR: INR: 2.5 (ref 2.0–3.0)

## 2019-05-05 NOTE — Progress Notes (Signed)
EPIC Encounter for ICM Monitoring  Patient Name: Marc Potter is a 73 y.o. male Date: 05/05/2019 Primary Care Physican: Enid Skeens., MD Primary Cardiologist:Berry Electrophysiologist: Croitoru 04/18/2019 Weight:196lbs(office weight)   Transmission reviewed.   Optivol thoracic impedance normal.  Prescribed dosage:Nodiuretic  Recommendations:  None  Follow-up plan: ICM clinic phone appointment on 06/12/2019.   91 day device clinic remote transmission 06/29/2019.     Copy of ICM check sent to Dr. Sallyanne Kuster.   3 month ICM trend: 05/02/2019    1 Year ICM trend:       Rosalene Billings, RN 05/05/2019 1:41 PM

## 2019-05-17 ENCOUNTER — Ambulatory Visit (INDEPENDENT_AMBULATORY_CARE_PROVIDER_SITE_OTHER): Payer: Medicare Other | Admitting: Cardiovascular Disease

## 2019-05-17 ENCOUNTER — Telehealth: Payer: Self-pay | Admitting: Cardiovascular Disease

## 2019-05-17 DIAGNOSIS — I82622 Acute embolism and thrombosis of deep veins of left upper extremity: Secondary | ICD-10-CM | POA: Diagnosis not present

## 2019-05-17 LAB — POCT INR: INR: 2.4 (ref 2.0–3.0)

## 2019-05-17 NOTE — Telephone Encounter (Signed)
New Message     Pt is calling about his Echo appt that is scheduled and is wanting to know why he needs to have another one scheduled so soon when he had one Nov 6th  He is also wondering if the insurance is going to cover this    Please call

## 2019-05-17 NOTE — Telephone Encounter (Signed)
Routed to primary nurse Debra RN as I could not see any need why patient would need additional echo. 12/11 test date was just scheduled today 12/2 by Jari Sportsman

## 2019-05-23 ENCOUNTER — Telehealth: Payer: Self-pay | Admitting: Cardiovascular Disease

## 2019-05-23 NOTE — Telephone Encounter (Signed)
Wife of patient wants to know if another Echo is necessary. The patient had an Echo done 11/06. The wife said that Dr. Gwenlyn Found documented on MyChart that the Echo was good and there were no changes. The wife wants to know if another Echo really is necessary. She is also concerned that Insurance will not pay for another Echo so close together. The next Echo has been ancelled, but the wife will reschedule if necessary.

## 2019-05-23 NOTE — Telephone Encounter (Signed)
His last echo was 3 years ago and showed an EF of 45 to 50%.  He is asymptomatic.  If you are concerned about cost we can forego the echo although it would be nice to know what his ejection fraction is 3 years later.

## 2019-05-24 NOTE — Telephone Encounter (Signed)
Last echo in [place] was performed on 04/21/2019 (ordered by Quay Burow, MD). There was another echo scheduled for 05/26/2019 that was cancelled. Patient wife wanted to know if this additional echo was necessary. It looks like the pt PCP Cecille Amsterdam, MD ordered the 05/26/2019 echo.   Going to assume that this additional echo unnecessary, but please confirm

## 2019-05-25 NOTE — Telephone Encounter (Signed)
No reason to perform 2 echoes in such close proximity

## 2019-05-25 NOTE — Telephone Encounter (Signed)
Pt aware of the following: " No reason to perform 2 echoes in such close proximity "  Pt verbalized understanding

## 2019-05-26 ENCOUNTER — Other Ambulatory Visit (HOSPITAL_COMMUNITY): Payer: Medicare Other

## 2019-05-31 ENCOUNTER — Ambulatory Visit (INDEPENDENT_AMBULATORY_CARE_PROVIDER_SITE_OTHER): Payer: Medicare Other | Admitting: Cardiology

## 2019-05-31 DIAGNOSIS — I82622 Acute embolism and thrombosis of deep veins of left upper extremity: Secondary | ICD-10-CM

## 2019-05-31 LAB — POCT INR: INR: 2.4 (ref 2.0–3.0)

## 2019-06-02 ENCOUNTER — Other Ambulatory Visit: Payer: Self-pay | Admitting: Cardiovascular Disease

## 2019-06-05 ENCOUNTER — Other Ambulatory Visit: Payer: Self-pay

## 2019-06-05 MED ORDER — CARVEDILOL 25 MG PO TABS: 25.0000 mg | ORAL_TABLET | Freq: Two times a day (BID) | ORAL | 0 refills | Status: DC

## 2019-06-12 ENCOUNTER — Ambulatory Visit (INDEPENDENT_AMBULATORY_CARE_PROVIDER_SITE_OTHER): Payer: Medicare Other

## 2019-06-12 DIAGNOSIS — Z95810 Presence of automatic (implantable) cardiac defibrillator: Secondary | ICD-10-CM

## 2019-06-12 DIAGNOSIS — I5022 Chronic systolic (congestive) heart failure: Secondary | ICD-10-CM | POA: Diagnosis not present

## 2019-06-13 NOTE — Progress Notes (Signed)
EPIC Encounter for ICM Monitoring  Patient Name: Marc Potter is a 73 y.o. male Date: 06/13/2019 Primary Care Physican: Enid Skeens., MD Primary Cardiologist:Berry Electrophysiologist: Croitoru 06/13/2019 Weight:194lbs   Spoke with patient and he is doing fine.  He denies any fluid symptoms.  Optivol thoracic impedance normal.  Prescribed dosage:Nodiuretic  Recommendations:No changes and encouraged to call if experiencing any fluid symptoms.  Follow-up plan: ICM clinic phone appointment on2/06/2019. 91 day device clinic remote transmission 06/29/2019.   Copy of ICM check sent to Dr.Croitoru.   3 month ICM trend: 06/12/2019    1 Year ICM trend:       Rosalene Billings, RN 06/13/2019 1:28 PM

## 2019-06-14 ENCOUNTER — Telehealth: Payer: Self-pay

## 2019-06-14 NOTE — Telephone Encounter (Signed)
Pt called in stating that they are out of strips and that we wouldn't receive a reading for the coumadin until next week.

## 2019-06-19 ENCOUNTER — Other Ambulatory Visit: Payer: Self-pay

## 2019-06-19 DIAGNOSIS — I482 Chronic atrial fibrillation, unspecified: Secondary | ICD-10-CM

## 2019-06-19 DIAGNOSIS — Z79899 Other long term (current) drug therapy: Secondary | ICD-10-CM

## 2019-06-19 DIAGNOSIS — I519 Heart disease, unspecified: Secondary | ICD-10-CM

## 2019-06-19 DIAGNOSIS — I10 Essential (primary) hypertension: Secondary | ICD-10-CM

## 2019-06-19 MED ORDER — LISINOPRIL 10 MG PO TABS: 10.0000 mg | ORAL_TABLET | Freq: Every day | ORAL | 0 refills | Status: DC

## 2019-06-20 LAB — POCT INR: INR: 2.8 (ref 2.0–3.0)

## 2019-06-21 ENCOUNTER — Ambulatory Visit (INDEPENDENT_AMBULATORY_CARE_PROVIDER_SITE_OTHER): Payer: Medicare Other | Admitting: Cardiology

## 2019-06-21 DIAGNOSIS — I82622 Acute embolism and thrombosis of deep veins of left upper extremity: Secondary | ICD-10-CM | POA: Diagnosis not present

## 2019-06-27 ENCOUNTER — Telehealth: Payer: Self-pay | Admitting: Cardiovascular Disease

## 2019-06-27 NOTE — Telephone Encounter (Signed)
We are recommending the COVID-19 vaccine to all of our patients. Cardiac medications (including blood thinners) should not deter anyone from being vaccinated and there is no need to hold any of those medications prior to vaccine administration.     Currently, there is a hotline to call (active 06/23/19) to schedule vaccination appointments as no walk-ins will be accepted.   Number: [phone]    If you have further questions or concerns about the vaccine process, please visit [email or web address] or contact your primary care physician.   I have informed patient of the instructions.        

## 2019-06-29 ENCOUNTER — Ambulatory Visit (INDEPENDENT_AMBULATORY_CARE_PROVIDER_SITE_OTHER): Payer: Medicare Other | Admitting: *Deleted

## 2019-06-29 DIAGNOSIS — I428 Other cardiomyopathies: Secondary | ICD-10-CM

## 2019-06-29 LAB — CUP PACEART REMOTE DEVICE CHECK
Battery Remaining Longevity: 82 mo
Battery Voltage: 2.97 V
Brady Statistic RV Percent Paced: 0.06 %
Date Time Interrogation Session: 20210114001705
HighPow Impedance: 73 Ohm
Implantable Lead Implant Date: 20161220
Implantable Lead Location: 753860
Implantable Pulse Generator Implant Date: 20161220
Lead Channel Impedance Value: 304 Ohm
Lead Channel Impedance Value: 399 Ohm
Lead Channel Pacing Threshold Amplitude: 1 V
Lead Channel Pacing Threshold Pulse Width: 0.4 ms
Lead Channel Sensing Intrinsic Amplitude: 13.75 mV
Lead Channel Sensing Intrinsic Amplitude: 13.75 mV
Lead Channel Setting Pacing Amplitude: 2.5 V
Lead Channel Setting Pacing Pulse Width: 0.4 ms
Lead Channel Setting Sensing Sensitivity: 0.3 mV

## 2019-06-30 NOTE — Progress Notes (Signed)
ICD remote 

## 2019-07-05 ENCOUNTER — Ambulatory Visit (INDEPENDENT_AMBULATORY_CARE_PROVIDER_SITE_OTHER): Payer: Medicare Other | Admitting: Pharmacist Clinician (PhC)/ Clinical Pharmacy Specialist

## 2019-07-05 DIAGNOSIS — Z7901 Long term (current) use of anticoagulants: Secondary | ICD-10-CM | POA: Diagnosis not present

## 2019-07-05 DIAGNOSIS — I82622 Acute embolism and thrombosis of deep veins of left upper extremity: Secondary | ICD-10-CM | POA: Diagnosis not present

## 2019-07-05 LAB — POCT INR: INR: 2.1 (ref 2.0–3.0)

## 2019-07-17 ENCOUNTER — Ambulatory Visit (INDEPENDENT_AMBULATORY_CARE_PROVIDER_SITE_OTHER): Payer: Medicare Other

## 2019-07-17 DIAGNOSIS — Z95810 Presence of automatic (implantable) cardiac defibrillator: Secondary | ICD-10-CM

## 2019-07-17 DIAGNOSIS — I5022 Chronic systolic (congestive) heart failure: Secondary | ICD-10-CM

## 2019-07-19 ENCOUNTER — Ambulatory Visit (INDEPENDENT_AMBULATORY_CARE_PROVIDER_SITE_OTHER): Payer: Medicare Other | Admitting: Cardiology

## 2019-07-19 ENCOUNTER — Telehealth: Payer: Self-pay

## 2019-07-19 DIAGNOSIS — I82622 Acute embolism and thrombosis of deep veins of left upper extremity: Secondary | ICD-10-CM

## 2019-07-19 LAB — POCT INR: INR: 2.1 (ref 2.0–3.0)

## 2019-07-19 NOTE — Telephone Encounter (Signed)
Remote ICM transmission received.  Attempted call to patient regarding ICM remote transmission and left detailed message per DPR.  Advised to return call for any fluid symptoms or questions. Next ICM remote transmission scheduled 08/21/2019.     

## 2019-07-19 NOTE — Progress Notes (Signed)
EPIC Encounter for ICM Monitoring  Patient Name: Marc Potter is a 74 y.o. male Date: 07/19/2019 Primary Care Physican: Nonnie Done., MD Primary Cardiologist:Berry Electrophysiologist: Croitoru 12/29/2020Weight:194lbs   Attempted call to patient and unable to reach.  Left detailed message per DPR regarding transmission. Transmission reviewed.   Optivol thoracic impedance normal.  Prescribed dosage:Nodiuretic  Recommendations: Left voice mail with ICM number and encouraged to call if experiencing any fluid symptoms.  Follow-up plan: ICM clinic phone appointment on3/01/2020. 91 day device clinic remote transmission 09/28/2019.   Copy of ICM check sent to Dr.Croitoru.  3 month ICM trend: 07/17/2019    1 Year ICM trend:       Karie Soda, RN 07/19/2019 2:51 PM

## 2019-08-02 ENCOUNTER — Ambulatory Visit (INDEPENDENT_AMBULATORY_CARE_PROVIDER_SITE_OTHER): Payer: Medicare Other | Admitting: Pharmacist

## 2019-08-02 DIAGNOSIS — I82622 Acute embolism and thrombosis of deep veins of left upper extremity: Secondary | ICD-10-CM | POA: Diagnosis not present

## 2019-08-02 LAB — POCT INR: INR: 2.5 (ref 2.0–3.0)

## 2019-08-16 ENCOUNTER — Ambulatory Visit (INDEPENDENT_AMBULATORY_CARE_PROVIDER_SITE_OTHER): Payer: Medicare Other | Admitting: Pharmacist

## 2019-08-16 DIAGNOSIS — I4811 Longstanding persistent atrial fibrillation: Secondary | ICD-10-CM

## 2019-08-16 DIAGNOSIS — I82622 Acute embolism and thrombosis of deep veins of left upper extremity: Secondary | ICD-10-CM | POA: Diagnosis not present

## 2019-08-16 DIAGNOSIS — Z7901 Long term (current) use of anticoagulants: Secondary | ICD-10-CM

## 2019-08-16 LAB — POCT INR: INR: 2.5 (ref 2.0–3.0)

## 2019-08-21 ENCOUNTER — Ambulatory Visit (INDEPENDENT_AMBULATORY_CARE_PROVIDER_SITE_OTHER): Payer: Medicare Other

## 2019-08-21 DIAGNOSIS — I5022 Chronic systolic (congestive) heart failure: Secondary | ICD-10-CM

## 2019-08-21 DIAGNOSIS — Z95810 Presence of automatic (implantable) cardiac defibrillator: Secondary | ICD-10-CM

## 2019-08-22 ENCOUNTER — Telehealth: Payer: Self-pay

## 2019-08-22 NOTE — Telephone Encounter (Signed)
Remote ICM transmission received.  Attempted call to patient regarding ICM remote transmission and left detailed message per DPR.  Advised to return call for any fluid symptoms or questions. Next ICM remote transmission scheduled 09/29/2019.

## 2019-08-22 NOTE — Progress Notes (Signed)
EPIC Encounter for ICM Monitoring  Patient Name: Marc Potter is a 74 y.o. male Date: 08/22/2019 Primary Care Physican: Nonnie Done., MD Primary Cardiologist:Berry Electrophysiologist: Croitoru LastWeight:194lbs   Attempted call to patient and unable to reach.  Left detailed message per DPR regarding transmission. Transmission reviewed.   Optivol thoracic impedance normal.  Prescribed dosage:Nodiuretic  Recommendations: Left voice mail with ICM number and encouraged to call if experiencing any fluid symptoms.  Follow-up plan: ICM clinic phone appointment on4/16/2021. 91 day device clinic remote transmission 09/28/2019.   Copy of ICM check sent to Dr.Croitoru.  3 month ICM trend: 08/21/2019    1 Year ICM trend:       Karie Soda, RN 08/22/2019 3:07 PM

## 2019-08-28 ENCOUNTER — Other Ambulatory Visit: Payer: Self-pay | Admitting: Cardiovascular Disease

## 2019-08-30 ENCOUNTER — Ambulatory Visit (INDEPENDENT_AMBULATORY_CARE_PROVIDER_SITE_OTHER): Payer: Medicare Other | Admitting: Cardiovascular Disease

## 2019-08-30 DIAGNOSIS — I82622 Acute embolism and thrombosis of deep veins of left upper extremity: Secondary | ICD-10-CM | POA: Diagnosis not present

## 2019-08-30 LAB — POCT INR: INR: 2 (ref 2.0–3.0)

## 2019-09-13 ENCOUNTER — Ambulatory Visit (INDEPENDENT_AMBULATORY_CARE_PROVIDER_SITE_OTHER): Payer: Medicare Other | Admitting: Internal Medicine

## 2019-09-13 DIAGNOSIS — I82622 Acute embolism and thrombosis of deep veins of left upper extremity: Secondary | ICD-10-CM

## 2019-09-13 LAB — POCT INR: INR: 2.8 (ref 2.0–3.0)

## 2019-09-17 ENCOUNTER — Other Ambulatory Visit: Payer: Self-pay | Admitting: Cardiovascular Disease

## 2019-09-17 DIAGNOSIS — I519 Heart disease, unspecified: Secondary | ICD-10-CM

## 2019-09-17 DIAGNOSIS — I10 Essential (primary) hypertension: Secondary | ICD-10-CM

## 2019-09-17 DIAGNOSIS — I482 Chronic atrial fibrillation, unspecified: Secondary | ICD-10-CM

## 2019-09-17 DIAGNOSIS — Z79899 Other long term (current) drug therapy: Secondary | ICD-10-CM

## 2019-09-27 ENCOUNTER — Ambulatory Visit (INDEPENDENT_AMBULATORY_CARE_PROVIDER_SITE_OTHER): Payer: Medicare Other | Admitting: Cardiovascular Disease

## 2019-09-27 ENCOUNTER — Telehealth: Payer: Self-pay | Admitting: Cardiovascular Disease

## 2019-09-27 DIAGNOSIS — I82622 Acute embolism and thrombosis of deep veins of left upper extremity: Secondary | ICD-10-CM | POA: Diagnosis not present

## 2019-09-27 LAB — POCT INR: INR: 2.8 (ref 2.0–3.0)

## 2019-09-27 NOTE — Telephone Encounter (Signed)
Patient called wanting to speak to the coumadin clinic.

## 2019-09-28 ENCOUNTER — Ambulatory Visit (INDEPENDENT_AMBULATORY_CARE_PROVIDER_SITE_OTHER): Payer: Medicare Other | Admitting: *Deleted

## 2019-09-28 DIAGNOSIS — I428 Other cardiomyopathies: Secondary | ICD-10-CM | POA: Diagnosis not present

## 2019-09-28 LAB — CUP PACEART REMOTE DEVICE CHECK
Battery Remaining Longevity: 79 mo
Battery Voltage: 2.96 V
Brady Statistic RV Percent Paced: 0.27 %
Date Time Interrogation Session: 20210415001703
HighPow Impedance: 72 Ohm
Implantable Lead Implant Date: 20161220
Implantable Lead Location: 753860
Implantable Pulse Generator Implant Date: 20161220
Lead Channel Impedance Value: 323 Ohm
Lead Channel Impedance Value: 399 Ohm
Lead Channel Pacing Threshold Amplitude: 1 V
Lead Channel Pacing Threshold Pulse Width: 0.4 ms
Lead Channel Sensing Intrinsic Amplitude: 11.75 mV
Lead Channel Sensing Intrinsic Amplitude: 11.75 mV
Lead Channel Setting Pacing Amplitude: 2.5 V
Lead Channel Setting Pacing Pulse Width: 0.4 ms
Lead Channel Setting Sensing Sensitivity: 0.3 mV

## 2019-09-29 ENCOUNTER — Ambulatory Visit (INDEPENDENT_AMBULATORY_CARE_PROVIDER_SITE_OTHER): Payer: Medicare Other

## 2019-09-29 DIAGNOSIS — I5022 Chronic systolic (congestive) heart failure: Secondary | ICD-10-CM

## 2019-09-29 DIAGNOSIS — Z95810 Presence of automatic (implantable) cardiac defibrillator: Secondary | ICD-10-CM

## 2019-09-29 NOTE — Progress Notes (Signed)
EPIC Encounter for ICM Monitoring  Patient Name: Marc Potter is a 74 y.o. male Date: 09/29/2019 Primary Care Physican: Nonnie Done., MD Primary Cardiologist:Berry Electrophysiologist: Croitoru LastWeight:194lbs   Spoke with patient and reports feeling well at this time.  Denies fluid symptoms.    Optivol thoracic impedance normal.  Prescribed dosage:Nodiuretic  Recommendations:No changes and encouraged to call if experiencing any fluid symptoms.  Follow-up plan: ICM clinic phone appointment on5/17/2021. 91 day device clinic remote transmission7/15/2021. Office appointment with Dr Royann Shivers on 10/02/2019.  Copy of ICM check sent to Dr.Croitoru.  3 month ICM trend: 09/28/2019    1 Year ICM trend:       Marc Soda, RN 09/29/2019 2:10 PM

## 2019-09-29 NOTE — Progress Notes (Signed)
ICD Remote  

## 2019-10-02 ENCOUNTER — Encounter: Payer: Self-pay | Admitting: Cardiovascular Disease

## 2019-10-02 ENCOUNTER — Ambulatory Visit (INDEPENDENT_AMBULATORY_CARE_PROVIDER_SITE_OTHER): Payer: Medicare Other | Admitting: Cardiovascular Disease

## 2019-10-02 ENCOUNTER — Other Ambulatory Visit: Payer: Self-pay

## 2019-10-02 VITALS — BP 130/82 | HR 80 | Ht 72.0 in | Wt 198.0 lb

## 2019-10-02 DIAGNOSIS — Z7901 Long term (current) use of anticoagulants: Secondary | ICD-10-CM

## 2019-10-02 DIAGNOSIS — I4811 Longstanding persistent atrial fibrillation: Secondary | ICD-10-CM | POA: Diagnosis not present

## 2019-10-02 DIAGNOSIS — I428 Other cardiomyopathies: Secondary | ICD-10-CM

## 2019-10-02 DIAGNOSIS — Z95810 Presence of automatic (implantable) cardiac defibrillator: Secondary | ICD-10-CM

## 2019-10-02 NOTE — Patient Instructions (Signed)

## 2019-10-02 NOTE — Progress Notes (Signed)
Cardiology office note     Date:  10/02/2019   ID:  Marc Potter, DOB Jan 26, 1946, MRN [id number]  PCP:  Nonnie Done., MD  Cardiologist:  [name], MD  Electrophysiologist:  Thurmon Fair, MD   Evaluation Performed:  Follow-Up Visit  Chief Complaint:  ICD follow up  History of Present Illness:    Marc Potter is a 74 y.o. male with permanent atrial fibrillation, longstanding nonischemic cardiomyopathy and well compensated heart failure, received a primary prevention single-chamber defibrillator in 2016.  Had partial recovery of left ventricular systolic function EF now estimated at 45%.  He is doing well from a cardiac point of view without any recent complaints.The patient specifically denies any chest pain at rest exertion, dyspnea at rest or with exertion, orthopnea, paroxysmal nocturnal dyspnea, syncope, palpitations, focal neurological deficits, intermittent claudication, lower extremity edema, unexplained weight gain, cough, hemoptysis or wheezing.  Comprehensive interrogation of his defibrillator today shows normal device function.  His Medtronic evera device still has roughly 6.5 years of estimated generator longevity and the lead parameters are excellent.  He does not require ventricular pacing.  He has not had any recorded episodes of rapid ventricular rates since 2019.  His average day/night heart rate splay is very broad, consistent with well compensated heart failure and a good prognosis.  His heart rate histogram distribution is appropriate.  He is compliant with warfarin anticoagulation and checks his INR with a home monitoring device.  He has not had any falls, injuries or bleeding problems.  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  He no longer has any symptoms related to this problem.   The patient does not have symptoms concerning for COVID-19 infection  (fever, chills, cough, or new shortness of breath).    Past Medical History:  Diagnosis Date  . AICD (automatic cardioverter/defibrillator) present 06/04/2015    Medtronic Evera XT VR model [id number] serial number [id number]  . Atrial fibrillation (HCC)   . Heart failure with reduced ejection fraction, NYHA class II (HCC)    a. EF 25-30% by echo in 02/2015  . Heart palpitations   . Hyperlipidemia   . Hypertension   . Left ventricular dysfunction   . Normal coronary arteries    by outpatient diagnostic coronary arteriography performed by Dr Allyson Sabal 12/24/14.   Past Surgical History:  Procedure Laterality Date  . CARDIAC CATHETERIZATION N/A 12/24/2014   Procedure: Left Heart Cath and Coronary Angiography;  Surgeon: Runell Gess, MD;  Location: [facility] INVASIVE CV LAB;  Service: Cardiovascular;  Laterality: N/A;  . DOPPLER ECHOCARDIOGRAPHY  2012  . EP IMPLANTABLE DEVICE N/A 06/04/2015   Procedure: ICD Implant;  Surgeon: Thurmon Fair, MD; Medtronic Evera XT VR model [id number] serial number [id number]; Laterality: N/A;  . NM MYOVIEW LTD  2012     Current Meds  Medication Sig  . atorvastatin (LIPITOR) 10 MG tablet Take 10 mg by mouth at bedtime.  . calcium-vitamin D (OSCAL WITH D) 500-200 MG-UNIT tablet Take 1 tablet by mouth.  . carvedilol (COREG) 25 MG tablet TAKE 1 TABLET TWICE A DAY  . Coenzyme Q10 (CO Q-10) 100 MG CAPS Take 1 tablet by mouth daily.  [name] glucosamine-chondroitin 500-400 MG tablet Take 1 tablet by mouth 2 (two) times daily.  [name] JANTOVEN 5 MG tablet TAKE 1 TABLET DAILY  . lisinopril (ZESTRIL) 10 MG tablet TAKE 1 TABLET DAILY  . Multiple Vitamin (MULTIVITAMIN) tablet Take 1  tablet by mouth daily.  . multivitamin-lutein (OCUVITE-LUTEIN) CAPS capsule Take 1 capsule by mouth daily.  [name] thyroid (ARMOUR) 90 MG tablet Take 90 mg by mouth daily.     Allergies:   Patient has no known allergies.   Social History   Tobacco Use  . Smoking status: Former Smoker    Quit date:  06/15/1993    Years since quitting: 26.3  . Smokeless tobacco: Never Used  Substance Use Topics  . Alcohol use: Yes    Comment: DRINKS ON OCCASION   . Drug use: No     Family Hx: The patient's family history includes Cancer in his father; Diabetes in his mother; Heart failure in his father; Hypertension in his mother.  ROS:   Please see the history of present illness.    All other systems are reviewed and are negative.  Prior CV studies:   The following studies were reviewed today: Comprehensive pacemaker check in the office today  Labs/Other Tests and Data Reviewed:    EKG: An electrocardiogram ordered today shows atrial fibrillation with controlled ventricular response, minor nonspecific T wave changes, borderline QTC 458 ms  Recent Labs: No results found for requested labs within last 8760 hours.   Recent Lipid Panel Lab Results  Component Value Date/Time   CHOL 195 04/11/2018 08:16 AM   TRIG 224 (H) 04/11/2018 08:16 AM   HDL 45 04/11/2018 08:16 AM   CHOLHDL 4.3 04/11/2018 08:16 AM   CHOLHDL 3.8 12/18/2014 09:59 AM   LDLCALC 105 (H) 04/11/2018 08:16 AM    Wt Readings from Last 3 Encounters:  10/02/19 198 lb (89.8 kg)  04/18/19 196 lb 3.2 oz (89 kg)  10/12/18 183 lb (83 kg)     Objective:    Vital Signs:  BP 130/82   Pulse 80   Ht 6' (1.829 m)   Wt 198 lb (89.8 kg)   SpO2 100%   BMI 26.85 kg/m    General: Alert, oriented x3, no distress, healthy left subclavian defibrillator site Head: no evidence of trauma, PERRL, EOMI, no exophtalmos or lid lag, no myxedema, no xanthelasma; normal ears, nose and oropharynx Neck: normal jugular venous pulsations and no hepatojugular reflux; brisk carotid pulses without delay and no carotid bruits Chest: clear to auscultation, no signs of consolidation by percussion or palpation, normal fremitus, symmetrical and full respiratory excursions Cardiovascular: normal position and quality of the apical impulse, irregular rhythm,  normal first and second heart sounds, no murmurs, rubs or gallops Abdomen: no tenderness or distention, no masses by palpation, no abnormal pulsatility or arterial bruits, normal bowel sounds, no hepatosplenomegaly Extremities: no clubbing, cyanosis or edema; 2+ radial, ulnar and brachial pulses bilaterally; 2+ right femoral, posterior tibial and dorsalis pedis pulses; 2+ left femoral, posterior tibial and dorsalis pedis pulses; no subclavian or femoral bruits Neurological: grossly nonfocal Psych: Normal mood and affect   ASSESSMENT & PLAN:    1. Longstanding persistent atrial fibrillation ([place])   2. ICD (implantable cardioverter-defibrillator) in place   3. Non-ischemic cardiomyopathy ([place])   4. Long term (current) use of anticoagulants      1. AFib: Well rate controlled and appropriately anticoagulated.  Asymptomatic. 2. ICD: Device function.  Remote downloads every 3 months. 3. NICMP: Well compensated and euvolemic without loop diuretic therapy.  On a high dose of carvedilol and on an ACE inhibitor. 4. Anticoagulation: Home INR monitoring.  Consistently therapeutic.  No bleeding problems.   COVID-19 Education: The signs and symptoms of COVID-19 were discussed with  the patient and how to seek care for testing (follow up with PCP or arrange E-visit).  The importance of social distancing was discussed today.  Time:   Today, I have spent 15 minutes with the patient with telehealth technology discussing the above problems.     Medication Adjustments/Labs and Tests Ordered: Current medicines are reviewed at length with the patient today.  Concerns regarding medicines are outlined above.   Tests Ordered: No orders of the defined types were placed in this encounter.   Medication Changes: No orders of the defined types were placed in this encounter.   Disposition:  Follow up 12 months  Signed, Thurmon Fair, MD  10/02/2019 10:13 AM    [facility]

## 2019-10-04 ENCOUNTER — Encounter: Payer: Self-pay | Admitting: Cardiovascular Disease

## 2019-10-11 ENCOUNTER — Ambulatory Visit (INDEPENDENT_AMBULATORY_CARE_PROVIDER_SITE_OTHER): Payer: Medicare Other | Admitting: Internal Medicine

## 2019-10-11 DIAGNOSIS — I82622 Acute embolism and thrombosis of deep veins of left upper extremity: Secondary | ICD-10-CM

## 2019-10-11 LAB — POCT INR: INR: 2.3 (ref 2.0–3.0)

## 2019-10-25 ENCOUNTER — Ambulatory Visit (INDEPENDENT_AMBULATORY_CARE_PROVIDER_SITE_OTHER): Payer: Medicare Other | Admitting: Cardiovascular Disease

## 2019-10-25 DIAGNOSIS — I82622 Acute embolism and thrombosis of deep veins of left upper extremity: Secondary | ICD-10-CM | POA: Diagnosis not present

## 2019-10-25 LAB — POCT INR: INR: 2 (ref 2.0–3.0)

## 2019-10-30 ENCOUNTER — Ambulatory Visit (INDEPENDENT_AMBULATORY_CARE_PROVIDER_SITE_OTHER): Payer: Medicare Other

## 2019-10-30 DIAGNOSIS — I5022 Chronic systolic (congestive) heart failure: Secondary | ICD-10-CM | POA: Diagnosis not present

## 2019-10-30 DIAGNOSIS — Z95810 Presence of automatic (implantable) cardiac defibrillator: Secondary | ICD-10-CM

## 2019-11-01 ENCOUNTER — Telehealth: Payer: Self-pay

## 2019-11-01 NOTE — Progress Notes (Signed)
EPIC Encounter for ICM Monitoring  Patient Name: Marc Potter is a 74 y.o. male Date: 11/01/2019 Primary Care Physican: Nonnie Done., MD Primary Cardiologist:Berry Electrophysiologist: Croitoru LastWeight:194lbs   Attempted call to patient and unable to reach.  Left detailed message per DPR regarding transmission. Transmission reviewed.   Optivol thoracic impedance normal.  Prescribed dosage:Nodiuretic  Recommendations:Left voice mail with ICM number and encouraged to call if experiencing any fluid symptoms.  Follow-up plan: ICM clinic phone appointment on6/21/2021. 91 day device clinic remote transmission7/15/2021.    Copy of ICM check sent to Dr.Croitoru  3 month ICM trend: 10/30/2019    1 Year ICM trend:       Karie Soda, RN 11/01/2019 11:14 AM

## 2019-11-01 NOTE — Telephone Encounter (Signed)
Remote ICM transmission received.  Attempted call to patient regarding ICM remote transmission and left detailed message per DPR.  Advised to return call for any fluid symptoms or questions. Next ICM remote transmission scheduled 12/04/2019.     

## 2019-11-08 ENCOUNTER — Ambulatory Visit (INDEPENDENT_AMBULATORY_CARE_PROVIDER_SITE_OTHER): Payer: Medicare Other | Admitting: Cardiovascular Disease

## 2019-11-08 DIAGNOSIS — Z7901 Long term (current) use of anticoagulants: Secondary | ICD-10-CM

## 2019-11-08 DIAGNOSIS — I82622 Acute embolism and thrombosis of deep veins of left upper extremity: Secondary | ICD-10-CM

## 2019-11-08 LAB — POCT INR: INR: 2.1 (ref 2.0–3.0)

## 2019-11-17 ENCOUNTER — Other Ambulatory Visit: Payer: Self-pay | Admitting: Cardiovascular Disease

## 2019-11-22 ENCOUNTER — Ambulatory Visit (INDEPENDENT_AMBULATORY_CARE_PROVIDER_SITE_OTHER): Payer: Medicare Other | Admitting: Pharmacist Clinician (PhC)/ Clinical Pharmacy Specialist

## 2019-11-22 DIAGNOSIS — I4811 Longstanding persistent atrial fibrillation: Secondary | ICD-10-CM | POA: Diagnosis not present

## 2019-11-22 DIAGNOSIS — I82622 Acute embolism and thrombosis of deep veins of left upper extremity: Secondary | ICD-10-CM | POA: Diagnosis not present

## 2019-11-22 DIAGNOSIS — Z7901 Long term (current) use of anticoagulants: Secondary | ICD-10-CM | POA: Diagnosis not present

## 2019-11-22 LAB — POCT INR: INR: 2.4 (ref 2.0–3.0)

## 2019-12-05 ENCOUNTER — Telehealth: Payer: Self-pay

## 2019-12-05 NOTE — Telephone Encounter (Signed)
Randon Goldsmith states she will reschedule him and give him a call.

## 2019-12-05 NOTE — Telephone Encounter (Signed)
Attempted call to patient.  Left message to send remote transmission on 12/11/2019 for monthly review.  Left ICM direct phone number and explained that is the best number to reach me for any further questions regarding remote transmission.

## 2019-12-05 NOTE — Telephone Encounter (Signed)
Left message for patient to remind of missed remote transmission.  

## 2019-12-05 NOTE — Telephone Encounter (Signed)
Patient is calling to follow up. He states he missed his transmission due to being out of town. He states he will be back in town on 12/07/19. He would like to ensure that he will still be able to send in transmission when he returns. Attempted to contact device. However, did not get an answer. Informed patient that device team may be on the line with another patient and that I would leave a message.

## 2019-12-06 ENCOUNTER — Telehealth: Payer: Self-pay

## 2019-12-06 NOTE — Telephone Encounter (Signed)
lmomed for overdue inr 

## 2019-12-07 ENCOUNTER — Ambulatory Visit (INDEPENDENT_AMBULATORY_CARE_PROVIDER_SITE_OTHER): Payer: Medicare Other | Admitting: Internal Medicine

## 2019-12-07 DIAGNOSIS — I4891 Unspecified atrial fibrillation: Secondary | ICD-10-CM

## 2019-12-07 DIAGNOSIS — I82622 Acute embolism and thrombosis of deep veins of left upper extremity: Secondary | ICD-10-CM

## 2019-12-07 LAB — POCT INR: INR: 2.4 (ref 2.0–3.0)

## 2019-12-07 NOTE — Telephone Encounter (Signed)
Sent phone note to laurie short.

## 2019-12-09 ENCOUNTER — Other Ambulatory Visit: Payer: Self-pay | Admitting: Cardiovascular Disease

## 2019-12-09 DIAGNOSIS — I519 Heart disease, unspecified: Secondary | ICD-10-CM

## 2019-12-09 DIAGNOSIS — Z79899 Other long term (current) drug therapy: Secondary | ICD-10-CM

## 2019-12-09 DIAGNOSIS — I10 Essential (primary) hypertension: Secondary | ICD-10-CM

## 2019-12-09 DIAGNOSIS — I482 Chronic atrial fibrillation, unspecified: Secondary | ICD-10-CM

## 2019-12-11 ENCOUNTER — Ambulatory Visit (INDEPENDENT_AMBULATORY_CARE_PROVIDER_SITE_OTHER): Payer: Medicare Other

## 2019-12-11 DIAGNOSIS — Z95810 Presence of automatic (implantable) cardiac defibrillator: Secondary | ICD-10-CM | POA: Diagnosis not present

## 2019-12-11 DIAGNOSIS — I5022 Chronic systolic (congestive) heart failure: Secondary | ICD-10-CM

## 2019-12-12 NOTE — Progress Notes (Signed)
EPIC Encounter for ICM Monitoring  Patient Name: Marc Potter is a 74 y.o. male Date: 12/12/2019 Primary Care Physican: Nonnie Done., MD Primary Cardiologist:Berry Electrophysiologist: Croitoru LastWeight:194lbs   Spoke with patient and reports feeling well at this time.  Denies fluid symptoms.  Pt is having difficulty sending remote transmission and will call Med[employer] to discuss.  Optivol thoracic impedance close to normal baseline.  Prescribed dosage:Nodiuretic  Recommendations:Recommendation to limit salt intake to 2000 mg daily and fluid intake to 64 oz daily.  Encouraged to call if experiencing any fluid symptoms.   Follow-up plan: ICM clinic phone appointment on8/07/2019. 91 day device clinic remote transmission7/15/2021.    Copy of ICM check sent to Dr.Croitoru  3 month ICM trend: 12/07/2019    1 Year ICM trend:       Karie Soda, RN 12/12/2019 11:40 AM

## 2019-12-12 NOTE — Progress Notes (Signed)
Thank you :)

## 2019-12-20 LAB — POCT INR: INR: 2.6 (ref 2.0–3.0)

## 2019-12-25 ENCOUNTER — Ambulatory Visit (INDEPENDENT_AMBULATORY_CARE_PROVIDER_SITE_OTHER): Payer: Medicare Other | Admitting: Cardiovascular Disease

## 2019-12-25 DIAGNOSIS — I82622 Acute embolism and thrombosis of deep veins of left upper extremity: Secondary | ICD-10-CM

## 2019-12-25 DIAGNOSIS — I4891 Unspecified atrial fibrillation: Secondary | ICD-10-CM

## 2019-12-28 ENCOUNTER — Ambulatory Visit (INDEPENDENT_AMBULATORY_CARE_PROVIDER_SITE_OTHER): Payer: Medicare Other | Admitting: *Deleted

## 2019-12-28 DIAGNOSIS — I428 Other cardiomyopathies: Secondary | ICD-10-CM | POA: Diagnosis not present

## 2019-12-28 LAB — CUP PACEART REMOTE DEVICE CHECK
Battery Remaining Longevity: 72 mo
Battery Voltage: 2.95 V
Brady Statistic RV Percent Paced: 0.16 %
Date Time Interrogation Session: 20210715044222
HighPow Impedance: 74 Ohm
Implantable Lead Implant Date: 20161220
Implantable Lead Location: 753860
Implantable Pulse Generator Implant Date: 20161220
Lead Channel Impedance Value: 323 Ohm
Lead Channel Impedance Value: 380 Ohm
Lead Channel Pacing Threshold Amplitude: 1 V
Lead Channel Pacing Threshold Pulse Width: 0.4 ms
Lead Channel Sensing Intrinsic Amplitude: 11.5 mV
Lead Channel Sensing Intrinsic Amplitude: 11.5 mV
Lead Channel Setting Pacing Amplitude: 2.5 V
Lead Channel Setting Pacing Pulse Width: 0.4 ms
Lead Channel Setting Sensing Sensitivity: 0.3 mV

## 2020-01-01 NOTE — Progress Notes (Signed)
Remote ICD transmission.   

## 2020-01-03 ENCOUNTER — Ambulatory Visit (INDEPENDENT_AMBULATORY_CARE_PROVIDER_SITE_OTHER): Payer: Medicare Other | Admitting: Cardiology

## 2020-01-03 DIAGNOSIS — I4891 Unspecified atrial fibrillation: Secondary | ICD-10-CM | POA: Diagnosis not present

## 2020-01-03 DIAGNOSIS — I82622 Acute embolism and thrombosis of deep veins of left upper extremity: Secondary | ICD-10-CM

## 2020-01-03 LAB — POCT INR: INR: 2.3 (ref 2.0–3.0)

## 2020-01-15 ENCOUNTER — Ambulatory Visit (INDEPENDENT_AMBULATORY_CARE_PROVIDER_SITE_OTHER): Payer: Medicare Other

## 2020-01-15 DIAGNOSIS — Z95810 Presence of automatic (implantable) cardiac defibrillator: Secondary | ICD-10-CM | POA: Diagnosis not present

## 2020-01-15 DIAGNOSIS — I5022 Chronic systolic (congestive) heart failure: Secondary | ICD-10-CM

## 2020-01-17 ENCOUNTER — Ambulatory Visit (INDEPENDENT_AMBULATORY_CARE_PROVIDER_SITE_OTHER): Payer: Medicare Other | Admitting: Pharmacist

## 2020-01-17 DIAGNOSIS — Z7901 Long term (current) use of anticoagulants: Secondary | ICD-10-CM

## 2020-01-17 DIAGNOSIS — I82622 Acute embolism and thrombosis of deep veins of left upper extremity: Secondary | ICD-10-CM

## 2020-01-17 LAB — POCT INR: INR: 2.5 (ref 2.0–3.0)

## 2020-01-19 ENCOUNTER — Telehealth: Payer: Self-pay

## 2020-01-19 NOTE — Progress Notes (Signed)
EPIC Encounter for ICM Monitoring  Patient Name: Marc Potter is a 74 y.o. male Date: 01/19/2020 Primary Care Physican: Nonnie Done., MD Primary Cardiologist:Berry Electrophysiologist: Croitoru 12/25/2019 Office Weight:188lbs   Attempted call to patient and unable to reach.  Left detailed message per DPR regarding transmission. Transmission reviewed.   Optivol thoracic impedance normal.  Prescribed dosage:Nodiuretic  Recommendations:Left voice mail with ICM number and encouraged to call if experiencing any fluid symptoms.  Follow-up plan: ICM clinic phone appointment on 02/26/2020.   91 day device clinic remote transmission 03/28/2020.    EP/Cardiology Office Visits: Recalls for 04/17/2020 with Dr. Allyson Sabal and 10/01/2020 with Dr Royann Shivers.    Copy of ICM check sent to Dr. Royann Shivers.   3 month ICM trend: 01/15/2020    1 Year ICM trend:       Karie Soda, RN 01/19/2020 10:36 AM

## 2020-01-19 NOTE — Telephone Encounter (Signed)
Remote ICM transmission received.  Attempted call to patient regarding ICM remote transmission and left detailed message per DPR.  Advised to return call for any fluid symptoms or questions. Next ICM remote transmission scheduled 02/26/2020.     

## 2020-01-31 ENCOUNTER — Ambulatory Visit (INDEPENDENT_AMBULATORY_CARE_PROVIDER_SITE_OTHER): Payer: Medicare Other | Admitting: Cardiology

## 2020-01-31 DIAGNOSIS — I4891 Unspecified atrial fibrillation: Secondary | ICD-10-CM | POA: Diagnosis not present

## 2020-01-31 LAB — POCT INR: INR: 2.9 (ref 2.0–3.0)

## 2020-02-14 ENCOUNTER — Telehealth: Payer: Self-pay

## 2020-02-14 ENCOUNTER — Ambulatory Visit (INDEPENDENT_AMBULATORY_CARE_PROVIDER_SITE_OTHER): Payer: Medicare Other | Admitting: Cardiovascular Disease

## 2020-02-14 DIAGNOSIS — I82622 Acute embolism and thrombosis of deep veins of left upper extremity: Secondary | ICD-10-CM

## 2020-02-14 DIAGNOSIS — I4891 Unspecified atrial fibrillation: Secondary | ICD-10-CM

## 2020-02-14 LAB — POCT INR: INR: 1.8 — AB (ref 2.0–3.0)

## 2020-02-14 NOTE — Telephone Encounter (Signed)
Called and lmomed the pt to please return our call for warfarin dosing of the 1.8 I mispoke to the pt and stated 2.9 by looking at the previous encounter. Will await callback from pt so that they may change his dose accordingly.

## 2020-02-17 ENCOUNTER — Other Ambulatory Visit: Payer: Self-pay | Admitting: Cardiovascular Disease

## 2020-02-26 ENCOUNTER — Ambulatory Visit (INDEPENDENT_AMBULATORY_CARE_PROVIDER_SITE_OTHER): Payer: Medicare Other

## 2020-02-26 DIAGNOSIS — I5022 Chronic systolic (congestive) heart failure: Secondary | ICD-10-CM

## 2020-02-26 DIAGNOSIS — Z95810 Presence of automatic (implantable) cardiac defibrillator: Secondary | ICD-10-CM

## 2020-02-28 ENCOUNTER — Ambulatory Visit (INDEPENDENT_AMBULATORY_CARE_PROVIDER_SITE_OTHER): Payer: Medicare Other | Admitting: Cardiology

## 2020-02-28 DIAGNOSIS — I4891 Unspecified atrial fibrillation: Secondary | ICD-10-CM | POA: Diagnosis not present

## 2020-02-28 LAB — POCT INR: INR: 2.5 (ref 2.0–3.0)

## 2020-03-01 NOTE — Progress Notes (Signed)
EPIC Encounter for ICM Monitoring  Patient Name: Marc Potter is a 74 y.o. male Date: 03/01/2020 Primary Care Physican: Nonnie Done., MD Primary Cardiologist:Berry Electrophysiologist: Croitoru 12/25/2019 Office Weight:188lbs   Transmission reviewed.   Optivol thoracic impedancenormal.  Prescribed dosage:Nodiuretic  Recommendations:No changes.  Follow-up plan: ICM clinic phone appointment on 04/02/2020.   91 day device clinic remote transmission 04/01/2020.    EP/Cardiology Office Visits: Recalls for 04/17/2020 with Dr. Allyson Sabal and 10/01/2020 with Dr Royann Shivers.    Copy of ICM check sent to Dr. Royann Shivers.   3 month ICM trend: 02/26/2020    1 Year ICM trend:       Karie Soda, RN 03/01/2020 8:17 AM

## 2020-03-06 ENCOUNTER — Other Ambulatory Visit: Payer: Self-pay | Admitting: Cardiovascular Disease

## 2020-03-07 ENCOUNTER — Telehealth: Payer: Self-pay | Admitting: *Deleted

## 2020-03-07 NOTE — Telephone Encounter (Signed)
A message was left, re: his follow up visit. 

## 2020-03-13 ENCOUNTER — Ambulatory Visit (INDEPENDENT_AMBULATORY_CARE_PROVIDER_SITE_OTHER): Payer: Medicare Other | Admitting: Cardiovascular Disease

## 2020-03-13 DIAGNOSIS — I4891 Unspecified atrial fibrillation: Secondary | ICD-10-CM | POA: Diagnosis not present

## 2020-03-13 LAB — POCT INR: INR: 2.5 (ref 2.0–3.0)

## 2020-03-27 ENCOUNTER — Ambulatory Visit (INDEPENDENT_AMBULATORY_CARE_PROVIDER_SITE_OTHER): Payer: Medicare Other | Admitting: Cardiology

## 2020-03-27 DIAGNOSIS — I4891 Unspecified atrial fibrillation: Secondary | ICD-10-CM

## 2020-03-27 LAB — POCT INR: INR: 2.6 (ref 2.0–3.0)

## 2020-04-01 ENCOUNTER — Ambulatory Visit (INDEPENDENT_AMBULATORY_CARE_PROVIDER_SITE_OTHER): Payer: Medicare Other

## 2020-04-01 DIAGNOSIS — I428 Other cardiomyopathies: Secondary | ICD-10-CM | POA: Diagnosis not present

## 2020-04-02 ENCOUNTER — Ambulatory Visit (INDEPENDENT_AMBULATORY_CARE_PROVIDER_SITE_OTHER): Payer: Medicare Other

## 2020-04-02 DIAGNOSIS — I5022 Chronic systolic (congestive) heart failure: Secondary | ICD-10-CM

## 2020-04-02 DIAGNOSIS — Z95810 Presence of automatic (implantable) cardiac defibrillator: Secondary | ICD-10-CM

## 2020-04-02 LAB — CUP PACEART REMOTE DEVICE CHECK
Battery Remaining Longevity: 67 mo
Battery Voltage: 2.97 V
Brady Statistic RV Percent Paced: 0.3 %
Date Time Interrogation Session: 20211018001805
HighPow Impedance: 70 Ohm
Implantable Lead Implant Date: 20161220
Implantable Lead Location: 753860
Implantable Pulse Generator Implant Date: 20161220
Lead Channel Impedance Value: 304 Ohm
Lead Channel Impedance Value: 380 Ohm
Lead Channel Pacing Threshold Amplitude: 1.25 V
Lead Channel Pacing Threshold Pulse Width: 0.4 ms
Lead Channel Sensing Intrinsic Amplitude: 11.5 mV
Lead Channel Sensing Intrinsic Amplitude: 11.5 mV
Lead Channel Setting Pacing Amplitude: 2.5 V
Lead Channel Setting Pacing Pulse Width: 0.4 ms
Lead Channel Setting Sensing Sensitivity: 0.3 mV

## 2020-04-03 NOTE — Progress Notes (Signed)
EPIC Encounter for ICM Monitoring  Patient Name: Marc Potter is a 74 y.o. male Date: 04/03/2020 Primary Care Physican: Nonnie Done., MD Primary Cardiologist:Berry Electrophysiologist: Croitoru Last OfficeWeight:188lbs   Transmission reviewed.  Optivol thoracic impedancenormal.  Prescribed dosage:Nodiuretic  Recommendations:No changes.  Follow-up plan: ICM clinic phone appointment on11/22/2021. 91 day device clinic remote transmission 07/01/2020.   EP/Cardiology Office Visits:Recalls for 04/17/2020 with Dr.Berry and 10/01/2020 with Dr Royann Shivers.   Copy of ICM check sent to Dr.Croitoru.   3 month ICM trend: 04/01/2020    1 Year ICM trend:       Karie Soda, RN 04/03/2020 1:20 PM

## 2020-04-05 NOTE — Progress Notes (Signed)
Remote ICD transmission.   

## 2020-04-10 ENCOUNTER — Ambulatory Visit (INDEPENDENT_AMBULATORY_CARE_PROVIDER_SITE_OTHER): Payer: Medicare Other | Admitting: Cardiovascular Disease

## 2020-04-10 DIAGNOSIS — I82622 Acute embolism and thrombosis of deep veins of left upper extremity: Secondary | ICD-10-CM

## 2020-04-10 DIAGNOSIS — I4891 Unspecified atrial fibrillation: Secondary | ICD-10-CM

## 2020-04-10 LAB — POCT INR: INR: 2.5 (ref 2.0–3.0)

## 2020-04-24 ENCOUNTER — Ambulatory Visit (INDEPENDENT_AMBULATORY_CARE_PROVIDER_SITE_OTHER): Payer: Medicare Other | Admitting: Internal Medicine

## 2020-04-24 DIAGNOSIS — I4891 Unspecified atrial fibrillation: Secondary | ICD-10-CM | POA: Diagnosis not present

## 2020-04-24 LAB — POCT INR: INR: 2.2 (ref 2.0–3.0)

## 2020-05-06 ENCOUNTER — Ambulatory Visit (INDEPENDENT_AMBULATORY_CARE_PROVIDER_SITE_OTHER): Payer: Medicare Other

## 2020-05-06 DIAGNOSIS — Z95810 Presence of automatic (implantable) cardiac defibrillator: Secondary | ICD-10-CM

## 2020-05-06 DIAGNOSIS — I5022 Chronic systolic (congestive) heart failure: Secondary | ICD-10-CM

## 2020-05-07 NOTE — Progress Notes (Signed)
EPIC Encounter for ICM Monitoring  Patient Name: Marc Potter is a 74 y.o. male Date: 05/07/2020 Primary Care Physican: Nonnie Done., MD Primary Cardiologist:Berry Electrophysiologist: Croitoru Last OfficeWeight:188lbs   Spoke with patient and reports feeling well at this time.  Denies fluid symptoms.    Optivol thoracic impedancenormal.  Prescribed dosage:Nodiuretic  Recommendations:  No changes and encouraged to call if experiencing any fluid symptoms.  Follow-up plan: ICM clinic phone appointment on12/272021. 91 day device clinic remote transmission 07/01/2020.   EP/Cardiology Office Visits:06/25/2020 with Dr.Berry.  Recall 10/01/2020 with Dr Royann Shivers.   Copy of ICM check sent to Dr.Croitoru.    3 month ICM trend: 05/06/2020    1 Year ICM trend:       Karie Soda, RN 05/07/2020 3:18 PM

## 2020-05-08 LAB — POCT INR: INR: 2.4 (ref 2.0–3.0)

## 2020-05-13 ENCOUNTER — Ambulatory Visit (INDEPENDENT_AMBULATORY_CARE_PROVIDER_SITE_OTHER): Payer: Medicare Other | Admitting: Cardiology

## 2020-05-13 DIAGNOSIS — I4891 Unspecified atrial fibrillation: Secondary | ICD-10-CM

## 2020-05-13 DIAGNOSIS — Z7901 Long term (current) use of anticoagulants: Secondary | ICD-10-CM | POA: Diagnosis not present

## 2020-05-22 ENCOUNTER — Ambulatory Visit (INDEPENDENT_AMBULATORY_CARE_PROVIDER_SITE_OTHER): Payer: Medicare Other | Admitting: Cardiovascular Disease

## 2020-05-22 DIAGNOSIS — I82622 Acute embolism and thrombosis of deep veins of left upper extremity: Secondary | ICD-10-CM | POA: Diagnosis not present

## 2020-05-22 DIAGNOSIS — Z5181 Encounter for therapeutic drug level monitoring: Secondary | ICD-10-CM

## 2020-05-22 LAB — POCT INR: INR: 1.7 — AB (ref 2.0–3.0)

## 2020-06-04 ENCOUNTER — Other Ambulatory Visit: Payer: Self-pay | Admitting: Cardiovascular Disease

## 2020-06-05 ENCOUNTER — Ambulatory Visit (INDEPENDENT_AMBULATORY_CARE_PROVIDER_SITE_OTHER): Payer: Medicare Other | Admitting: Cardiovascular Disease

## 2020-06-05 ENCOUNTER — Telehealth: Payer: Self-pay | Admitting: Cardiovascular Disease

## 2020-06-05 DIAGNOSIS — I4891 Unspecified atrial fibrillation: Secondary | ICD-10-CM | POA: Diagnosis not present

## 2020-06-05 LAB — POCT INR: INR: 2.2 (ref 2.0–3.0)

## 2020-06-05 NOTE — Telephone Encounter (Signed)
Encounter opened in error

## 2020-06-12 NOTE — Progress Notes (Signed)
No ICM remote transmission received for 06/10/2020 and next ICM transmission scheduled for 07/02/2020.

## 2020-06-13 ENCOUNTER — Telehealth: Payer: Self-pay | Admitting: Cardiovascular Disease

## 2020-06-13 NOTE — Telephone Encounter (Signed)
*  STAT* If patient is at the pharmacy, call can be transferred to refill team.   1. Which medications need to be refilled? (please list name of each medication and dose if known) Coumadin 5 MG  2. Which pharmacy/location (including street and city if local pharmacy) is medication to be sent to? [facility] - [address] [place], [place] [address] [phone]  3. Do they need a 30 day or 90 day supply? Patient states he called in a refill with [facility], however, he is own of town and he will be out of medication by 06/17/20. He is requesting to have a 15 day supply of medication sent to a local pharmacy while he is out of town.

## 2020-06-14 ENCOUNTER — Telehealth: Payer: Self-pay | Admitting: Cardiology

## 2020-06-14 NOTE — Telephone Encounter (Signed)
Pt called and needed a 10 day supple of [facility] [place] [phone] which I did.  Corine Shelter PA-C 06/14/2020 4:28 PM

## 2020-06-19 ENCOUNTER — Ambulatory Visit (INDEPENDENT_AMBULATORY_CARE_PROVIDER_SITE_OTHER): Payer: Medicare Other | Admitting: Cardiology

## 2020-06-19 DIAGNOSIS — Z5181 Encounter for therapeutic drug level monitoring: Secondary | ICD-10-CM | POA: Diagnosis not present

## 2020-06-19 LAB — POCT INR: INR: 2.1 (ref 2.0–3.0)

## 2020-06-25 ENCOUNTER — Ambulatory Visit: Payer: Medicare Other | Admitting: Cardiovascular Disease

## 2020-07-01 ENCOUNTER — Ambulatory Visit (INDEPENDENT_AMBULATORY_CARE_PROVIDER_SITE_OTHER): Payer: Medicare Other

## 2020-07-01 DIAGNOSIS — I5022 Chronic systolic (congestive) heart failure: Secondary | ICD-10-CM

## 2020-07-02 ENCOUNTER — Ambulatory Visit (INDEPENDENT_AMBULATORY_CARE_PROVIDER_SITE_OTHER): Payer: Medicare Other

## 2020-07-02 DIAGNOSIS — Z95810 Presence of automatic (implantable) cardiac defibrillator: Secondary | ICD-10-CM

## 2020-07-02 DIAGNOSIS — I5022 Chronic systolic (congestive) heart failure: Secondary | ICD-10-CM | POA: Diagnosis not present

## 2020-07-02 NOTE — Progress Notes (Signed)
EPIC Encounter for ICM Monitoring  Patient Name: Marc Potter is a 75 y.o. male Date: 07/02/2020 Primary Care Physican: Nonnie Done., MD Primary Cardiologist:Berry Electrophysiologist: Croitoru 1/18/2022Weight:179lbs   Spoke with patient and reports feeling well at this time.  Denies fluid symptoms.    Optivol thoracic impedancenormal.  Prescribed dosage:Nodiuretic  Recommendations:  No changes and encouraged to call if experiencing any fluid symptoms.  Follow-up plan: ICM clinic phone appointment on2/22/2022. 91 day device clinic remote transmission 09/30/2020.   EP/Cardiology Office Visits:11/13/2020 with Dr.Berry.  Recall 10/01/2020 with Dr Royann Shivers.   Copy of ICM check sent to Dr.Croitoru  3 month ICM trend: 07/01/2020.    1 Year ICM trend:       Karie Soda, RN 07/02/2020 11:01 AM

## 2020-07-03 ENCOUNTER — Ambulatory Visit (INDEPENDENT_AMBULATORY_CARE_PROVIDER_SITE_OTHER): Payer: Medicare Other | Admitting: Internal Medicine

## 2020-07-03 DIAGNOSIS — Z5181 Encounter for therapeutic drug level monitoring: Secondary | ICD-10-CM | POA: Diagnosis not present

## 2020-07-03 LAB — CUP PACEART REMOTE DEVICE CHECK
Battery Remaining Longevity: 65 mo
Battery Voltage: 2.96 V
Brady Statistic RV Percent Paced: 0.34 %
Date Time Interrogation Session: 20220117043724
HighPow Impedance: 67 Ohm
Implantable Lead Implant Date: 20161220
Implantable Lead Location: 753860
Implantable Pulse Generator Implant Date: 20161220
Lead Channel Impedance Value: 323 Ohm
Lead Channel Impedance Value: 380 Ohm
Lead Channel Pacing Threshold Amplitude: 1.125 V
Lead Channel Pacing Threshold Pulse Width: 0.4 ms
Lead Channel Sensing Intrinsic Amplitude: 15 mV
Lead Channel Sensing Intrinsic Amplitude: 15 mV
Lead Channel Setting Pacing Amplitude: 2.75 V
Lead Channel Setting Pacing Pulse Width: 0.4 ms
Lead Channel Setting Sensing Sensitivity: 0.3 mV

## 2020-07-03 LAB — POCT INR: INR: 2.1 (ref 2.0–3.0)

## 2020-07-16 NOTE — Progress Notes (Signed)
Remote ICD transmission.   

## 2020-07-17 ENCOUNTER — Ambulatory Visit (INDEPENDENT_AMBULATORY_CARE_PROVIDER_SITE_OTHER): Payer: Medicare Other | Admitting: Cardiovascular Disease

## 2020-07-17 DIAGNOSIS — Z5181 Encounter for therapeutic drug level monitoring: Secondary | ICD-10-CM

## 2020-07-17 LAB — POCT INR: INR: 1.8 — AB (ref 2.0–3.0)

## 2020-07-31 ENCOUNTER — Ambulatory Visit (INDEPENDENT_AMBULATORY_CARE_PROVIDER_SITE_OTHER): Payer: Medicare Other | Admitting: Cardiovascular Disease

## 2020-07-31 DIAGNOSIS — I4891 Unspecified atrial fibrillation: Secondary | ICD-10-CM | POA: Diagnosis not present

## 2020-07-31 LAB — POCT INR: INR: 2.9 (ref 2.0–3.0)

## 2020-08-06 ENCOUNTER — Ambulatory Visit (INDEPENDENT_AMBULATORY_CARE_PROVIDER_SITE_OTHER): Payer: Medicare Other

## 2020-08-06 DIAGNOSIS — I5022 Chronic systolic (congestive) heart failure: Secondary | ICD-10-CM

## 2020-08-06 DIAGNOSIS — Z95810 Presence of automatic (implantable) cardiac defibrillator: Secondary | ICD-10-CM | POA: Diagnosis not present

## 2020-08-08 ENCOUNTER — Telehealth: Payer: Self-pay

## 2020-08-08 NOTE — Telephone Encounter (Signed)
The patient states he is out of town and will send a transmission on Saturday when he is back in town.

## 2020-08-08 NOTE — Telephone Encounter (Signed)
LMOVM for patient to send a manual transmission with his home monitor.

## 2020-08-13 NOTE — Progress Notes (Signed)
EPIC Encounter for ICM Monitoring  Patient Name: Marc Potter is a 75 y.o. male Date: 08/13/2020 Primary Care Physican: Nonnie Done., MD Primary Cardiologist:Berry Electrophysiologist: Croitoru 3/1/2022Weight:180lbs   Spoke with patient and reports feeling well at this time. Denies fluid symptoms. He has been on vacation during decreased impedance and eating out mostly at restaurants   Optivol thoracic impedancesuggesting possible fluid accumulation starting 07/29/2020.  Prescribed dosage:Nodiuretic  Recommendations:Recommendation to limit salt intake daily.  Encouraged to call if experiencing any fluid symptoms.   Follow-up plan: ICM clinic phone appointment on3/09/2020 to recheck fluid levels. 91 day device clinic remote transmission 09/30/2020.   EP/Cardiology Office Visits:6/1/2022with Dr.Berry. Recall4/19/2022 with Dr Royann Shivers.   Copy of ICM check sent to Dr.Croitoru.   3 month ICM trend: 08/10/2020.    1 Year ICM trend:       Karie Soda, RN 08/13/2020 5:20 PM

## 2020-08-14 LAB — POCT INR: INR: 1.9 — AB (ref 2.0–3.0)

## 2020-08-15 ENCOUNTER — Ambulatory Visit (INDEPENDENT_AMBULATORY_CARE_PROVIDER_SITE_OTHER): Payer: Medicare Other | Admitting: Cardiovascular Disease

## 2020-08-15 ENCOUNTER — Telehealth: Payer: Self-pay | Admitting: Cardiovascular Disease

## 2020-08-15 DIAGNOSIS — I4891 Unspecified atrial fibrillation: Secondary | ICD-10-CM

## 2020-08-15 DIAGNOSIS — I82622 Acute embolism and thrombosis of deep veins of left upper extremity: Secondary | ICD-10-CM

## 2020-08-15 NOTE — Telephone Encounter (Signed)
Patient would like to know if his INR reading was received from yesterday.

## 2020-08-15 NOTE — Telephone Encounter (Signed)
addressed in anticoagulation encounter

## 2020-08-16 ENCOUNTER — Ambulatory Visit (INDEPENDENT_AMBULATORY_CARE_PROVIDER_SITE_OTHER): Payer: Medicare Other

## 2020-08-16 DIAGNOSIS — I5022 Chronic systolic (congestive) heart failure: Secondary | ICD-10-CM

## 2020-08-16 DIAGNOSIS — Z95810 Presence of automatic (implantable) cardiac defibrillator: Secondary | ICD-10-CM

## 2020-08-16 NOTE — Progress Notes (Signed)
EPIC Encounter for ICM Monitoring  Patient Name: Marc Potter is a 75 y.o. male Date: 08/16/2020 Primary Care Physican: Nonnie Done., MD Primary Cardiologist:Berry Electrophysiologist: Croitoru 3/4/2022Weight:179lbs   Spoke with patient and reports feeling well at this time.  Denies fluid symptoms.  Weight decreased by a coule of pounds.  Optivol thoracic impedancesuggesting fluid levels returned to normal after adjusting diet.  Prescribed dosage:Nodiuretic  Recommendations:No changes and encouraged to call if experiencing any fluid symptoms.  Follow-up plan: ICM clinic phone appointment on4/09/2020. 91 day device clinic remote transmission4/18/2022.   EP/Cardiology Office Visits:5/3/2022with Dr.Berry. 10/14/2020 with Dr Royann Shivers.   Copy of ICM check sent to Dr.Croitoru.   3 month ICM trend: 08/16/2020.    1 Year ICM trend:       Karie Soda, RN 08/16/2020 8:25 AM

## 2020-08-19 ENCOUNTER — Other Ambulatory Visit: Payer: Self-pay

## 2020-08-19 MED ORDER — WARFARIN SODIUM 5 MG PO TABS: 5.0000 mg | ORAL_TABLET | Freq: Every day | ORAL | 1 refills | Status: DC

## 2020-08-28 ENCOUNTER — Ambulatory Visit (INDEPENDENT_AMBULATORY_CARE_PROVIDER_SITE_OTHER): Payer: Medicare Other | Admitting: Cardiovascular Disease

## 2020-08-28 DIAGNOSIS — I82622 Acute embolism and thrombosis of deep veins of left upper extremity: Secondary | ICD-10-CM | POA: Diagnosis not present

## 2020-08-28 DIAGNOSIS — I4891 Unspecified atrial fibrillation: Secondary | ICD-10-CM | POA: Diagnosis not present

## 2020-08-28 LAB — POCT INR: INR: 2.9 (ref 2.0–3.0)

## 2020-09-11 ENCOUNTER — Ambulatory Visit (INDEPENDENT_AMBULATORY_CARE_PROVIDER_SITE_OTHER): Payer: Medicare Other | Admitting: Cardiovascular Disease

## 2020-09-11 DIAGNOSIS — I4891 Unspecified atrial fibrillation: Secondary | ICD-10-CM | POA: Diagnosis not present

## 2020-09-11 DIAGNOSIS — I82622 Acute embolism and thrombosis of deep veins of left upper extremity: Secondary | ICD-10-CM

## 2020-09-11 LAB — POCT INR: INR: 2.6 (ref 2.0–3.0)

## 2020-09-20 NOTE — Progress Notes (Signed)
No ICM remote transmission received for 09/16/2020 and next ICM transmission scheduled for 10/01/2020.

## 2020-09-25 ENCOUNTER — Ambulatory Visit (INDEPENDENT_AMBULATORY_CARE_PROVIDER_SITE_OTHER): Payer: Medicare Other | Admitting: Cardiology

## 2020-09-25 DIAGNOSIS — I4891 Unspecified atrial fibrillation: Secondary | ICD-10-CM

## 2020-09-25 LAB — POCT INR: INR: 2.2 (ref 2.0–3.0)

## 2020-09-30 ENCOUNTER — Ambulatory Visit (INDEPENDENT_AMBULATORY_CARE_PROVIDER_SITE_OTHER): Payer: Medicare Other

## 2020-09-30 DIAGNOSIS — I428 Other cardiomyopathies: Secondary | ICD-10-CM | POA: Diagnosis not present

## 2020-10-01 ENCOUNTER — Ambulatory Visit: Payer: Medicare Other

## 2020-10-02 LAB — CUP PACEART REMOTE DEVICE CHECK
Battery Remaining Longevity: 50 mo
Battery Voltage: 2.96 V
Brady Statistic RV Percent Paced: 0.31 %
Date Time Interrogation Session: 20220419111946
HighPow Impedance: 74 Ohm
Implantable Lead Implant Date: 20161220
Implantable Lead Location: 753860
Implantable Pulse Generator Implant Date: 20161220
Lead Channel Impedance Value: 342 Ohm
Lead Channel Impedance Value: 399 Ohm
Lead Channel Pacing Threshold Amplitude: 0.875 V
Lead Channel Pacing Threshold Pulse Width: 0.4 ms
Lead Channel Sensing Intrinsic Amplitude: 12.5 mV
Lead Channel Sensing Intrinsic Amplitude: 12.5 mV
Lead Channel Setting Pacing Amplitude: 2.5 V
Lead Channel Setting Pacing Pulse Width: 0.4 ms
Lead Channel Setting Sensing Sensitivity: 0.3 mV

## 2020-10-09 ENCOUNTER — Ambulatory Visit (INDEPENDENT_AMBULATORY_CARE_PROVIDER_SITE_OTHER): Payer: Medicare Other | Admitting: Cardiovascular Disease

## 2020-10-09 DIAGNOSIS — I4891 Unspecified atrial fibrillation: Secondary | ICD-10-CM

## 2020-10-09 LAB — POCT INR: INR: 2.6 (ref 2.0–3.0)

## 2020-10-11 ENCOUNTER — Ambulatory Visit (INDEPENDENT_AMBULATORY_CARE_PROVIDER_SITE_OTHER): Payer: Medicare Other

## 2020-10-11 DIAGNOSIS — I5022 Chronic systolic (congestive) heart failure: Secondary | ICD-10-CM | POA: Diagnosis not present

## 2020-10-11 DIAGNOSIS — Z95810 Presence of automatic (implantable) cardiac defibrillator: Secondary | ICD-10-CM

## 2020-10-11 NOTE — Progress Notes (Signed)
EPIC Encounter for ICM Monitoring  Patient Name: Nikki Rusnak is a 75 y.o. male Date: 10/11/2020 Primary Care Physican: Nonnie Done., MD Primary Cardiologist:Berry Electrophysiologist: Croitoru 3/4/2022Weight:179lbs   Transmission reviewed.  Optivol thoracic impedancesuggesting normal fluid levels.  Prescribed dosage:Nodiuretic  Recommendations:No changes and encouraged to call if experiencing any fluid symptoms.  Follow-up plan: ICM clinic phone appointment on6/11/2020. 91 day device clinic remote transmission7/18/2022.   EP/Cardiology Office Visits:5/3/2022with Dr.Berry. 10/14/2020 with Dr Royann Shivers.   Copy of ICM check sent to Dr.Croitoru  3 month ICM trend: 10/04/2020.    1 Year ICM trend:       Karie Soda, RN 10/11/2020 2:49 PM

## 2020-10-14 ENCOUNTER — Encounter: Payer: Medicare Other | Admitting: Cardiovascular Disease

## 2020-10-15 ENCOUNTER — Ambulatory Visit: Payer: Medicare Other | Admitting: Cardiovascular Disease

## 2020-10-17 NOTE — Progress Notes (Signed)
Remote ICD transmission.   

## 2020-10-23 ENCOUNTER — Ambulatory Visit (INDEPENDENT_AMBULATORY_CARE_PROVIDER_SITE_OTHER): Payer: Medicare Other | Admitting: Cardiovascular Disease

## 2020-10-23 DIAGNOSIS — I4891 Unspecified atrial fibrillation: Secondary | ICD-10-CM

## 2020-10-23 LAB — POCT INR: INR: 2.2 (ref 2.0–3.0)

## 2020-11-06 ENCOUNTER — Ambulatory Visit (INDEPENDENT_AMBULATORY_CARE_PROVIDER_SITE_OTHER): Payer: Medicare Other | Admitting: Cardiovascular Disease

## 2020-11-06 DIAGNOSIS — I82622 Acute embolism and thrombosis of deep veins of left upper extremity: Secondary | ICD-10-CM

## 2020-11-06 DIAGNOSIS — I4891 Unspecified atrial fibrillation: Secondary | ICD-10-CM

## 2020-11-06 LAB — POCT INR: INR: 2.5 (ref 2.0–3.0)

## 2020-11-13 ENCOUNTER — Ambulatory Visit: Payer: Medicare Other | Admitting: Cardiovascular Disease

## 2020-11-18 ENCOUNTER — Ambulatory Visit (INDEPENDENT_AMBULATORY_CARE_PROVIDER_SITE_OTHER): Payer: Medicare Other

## 2020-11-18 DIAGNOSIS — Z95810 Presence of automatic (implantable) cardiac defibrillator: Secondary | ICD-10-CM

## 2020-11-18 DIAGNOSIS — I5022 Chronic systolic (congestive) heart failure: Secondary | ICD-10-CM | POA: Diagnosis not present

## 2020-11-20 ENCOUNTER — Ambulatory Visit (INDEPENDENT_AMBULATORY_CARE_PROVIDER_SITE_OTHER): Payer: Medicare Other | Admitting: Cardiovascular Disease

## 2020-11-20 DIAGNOSIS — I4891 Unspecified atrial fibrillation: Secondary | ICD-10-CM | POA: Diagnosis not present

## 2020-11-20 DIAGNOSIS — I82622 Acute embolism and thrombosis of deep veins of left upper extremity: Secondary | ICD-10-CM

## 2020-11-20 LAB — POCT INR: INR: 2.9 (ref 2.0–3.0)

## 2020-11-22 ENCOUNTER — Telehealth: Payer: Self-pay

## 2020-11-22 NOTE — Progress Notes (Signed)
EPIC Encounter for ICM Monitoring  Patient Name: Marc Potter is a 75 y.o. male Date: 11/22/2020 Primary Care Physican: Nonnie Done., MD PPrimary Cardiologist: Allyson Sabal Electrophysiologist: Croitoru 08/16/2020 Weight: 179 lbs                                   Attempted call to patient and unable to reach.  Left detailed message per DPR regarding transmission. Transmission reviewed.    Optivol thoracic impedance suggesting normal fluid levels.   Prescribed dosage: No diuretic   Recommendations: Left voice mail with ICM number and encouraged to call if experiencing any fluid symptoms.   Follow-up plan: ICM clinic phone appointment on 12/31/2020.   91 day device clinic remote transmission 12/30/2020.     EP/Cardiology Office Visits: 02/05/2021 with Dr. Allyson Sabal.  02/03/2021 with Dr Royann Shivers.     Copy of ICM check sent to Dr. Royann Shivers  3 month ICM trend: 11/18/2020.    1 Year ICM trend:       Karie Soda, RN 11/22/2020 8:50 AM

## 2020-11-22 NOTE — Telephone Encounter (Signed)
Remote ICM transmission received.  Attempted call to patient regarding ICM remote transmission and left detailed message per DPR.  Advised to return call for any fluid symptoms or questions. Next ICM remote transmission scheduled 12/31/2020.

## 2020-12-04 ENCOUNTER — Ambulatory Visit (INDEPENDENT_AMBULATORY_CARE_PROVIDER_SITE_OTHER): Payer: Medicare Other | Admitting: Cardiovascular Disease

## 2020-12-04 DIAGNOSIS — I4891 Unspecified atrial fibrillation: Secondary | ICD-10-CM | POA: Diagnosis not present

## 2020-12-04 DIAGNOSIS — I82622 Acute embolism and thrombosis of deep veins of left upper extremity: Secondary | ICD-10-CM | POA: Diagnosis not present

## 2020-12-04 LAB — POCT INR: INR: 3.7 — AB (ref 2.0–3.0)

## 2020-12-06 ENCOUNTER — Other Ambulatory Visit: Payer: Self-pay | Admitting: Pharmacist

## 2020-12-06 MED ORDER — WARFARIN SODIUM 5 MG PO TABS: ORAL_TABLET | ORAL | 0 refills | Status: DC

## 2020-12-06 NOTE — Telephone Encounter (Signed)
Patient called to request 1 week of warfarin Rx to local store. Mail order delayed and will be out of medication on Monday.  [facility] tablets [facility] to [facility] [address], [place], [place]

## 2020-12-18 ENCOUNTER — Ambulatory Visit: Payer: Medicare Other | Admitting: Cardiovascular Disease

## 2020-12-18 ENCOUNTER — Ambulatory Visit (INDEPENDENT_AMBULATORY_CARE_PROVIDER_SITE_OTHER): Payer: Medicare Other | Admitting: Internal Medicine

## 2020-12-18 DIAGNOSIS — Z5181 Encounter for therapeutic drug level monitoring: Secondary | ICD-10-CM

## 2020-12-18 LAB — POCT INR: INR: 3 (ref 2.0–3.0)

## 2020-12-30 ENCOUNTER — Ambulatory Visit (INDEPENDENT_AMBULATORY_CARE_PROVIDER_SITE_OTHER): Payer: Medicare Other

## 2020-12-30 DIAGNOSIS — I428 Other cardiomyopathies: Secondary | ICD-10-CM

## 2020-12-31 ENCOUNTER — Ambulatory Visit (INDEPENDENT_AMBULATORY_CARE_PROVIDER_SITE_OTHER): Payer: Medicare Other

## 2020-12-31 DIAGNOSIS — I5022 Chronic systolic (congestive) heart failure: Secondary | ICD-10-CM

## 2020-12-31 DIAGNOSIS — Z95810 Presence of automatic (implantable) cardiac defibrillator: Secondary | ICD-10-CM

## 2020-12-31 LAB — CUP PACEART REMOTE DEVICE CHECK
Battery Remaining Longevity: 56 mo
Battery Voltage: 2.95 V
Brady Statistic RV Percent Paced: 0.42 %
Date Time Interrogation Session: 20220718023324
HighPow Impedance: 75 Ohm
Implantable Lead Implant Date: 20161220
Implantable Lead Location: 753860
Implantable Pulse Generator Implant Date: 20161220
Lead Channel Impedance Value: 323 Ohm
Lead Channel Impedance Value: 380 Ohm
Lead Channel Pacing Threshold Amplitude: 1 V
Lead Channel Pacing Threshold Pulse Width: 0.4 ms
Lead Channel Sensing Intrinsic Amplitude: 12.375 mV
Lead Channel Sensing Intrinsic Amplitude: 12.375 mV
Lead Channel Setting Pacing Amplitude: 2.5 V
Lead Channel Setting Pacing Pulse Width: 0.4 ms
Lead Channel Setting Sensing Sensitivity: 0.3 mV

## 2021-01-01 ENCOUNTER — Ambulatory Visit (INDEPENDENT_AMBULATORY_CARE_PROVIDER_SITE_OTHER): Payer: Medicare Other | Admitting: Cardiology

## 2021-01-01 DIAGNOSIS — Z5181 Encounter for therapeutic drug level monitoring: Secondary | ICD-10-CM

## 2021-01-01 DIAGNOSIS — Z7901 Long term (current) use of anticoagulants: Secondary | ICD-10-CM

## 2021-01-01 LAB — POCT INR: INR: 3.4 — AB (ref 2.0–3.0)

## 2021-01-06 NOTE — Progress Notes (Addendum)
EPIC Encounter for ICM Monitoring  Patient Name: Marc Potter is a 75 y.o. male Date: 01/06/2021 Primary Care Physican: Nonnie Done., MD Primary Cardiologist: Allyson Sabal Electrophysiologist: Croitoru Last Weight: 179 lbs                                   Transmission reviewed.   Optivol thoracic impedance suggesting normal fluid levels.   Prescribed dosage: No diuretic   Recommendations: No changes   Follow-up plan: ICM clinic phone appointment on 02/10/2021.   91 day device clinic remote transmission 03/31/2021.     EP/Cardiology Office Visits: 02/05/2021 with Dr. Allyson Sabal.  02/03/2021 with Dr Royann Shivers.     Copy of ICM check sent to Dr. Royann Shivers.   3 month ICM trend: 01/05/2021.    1 Year ICM trend:       Karie Soda, RN 01/06/2021 1:09 PM

## 2021-01-14 ENCOUNTER — Telehealth: Payer: Self-pay | Admitting: Cardiovascular Disease

## 2021-01-14 DIAGNOSIS — I482 Chronic atrial fibrillation, unspecified: Secondary | ICD-10-CM

## 2021-01-14 DIAGNOSIS — I519 Heart disease, unspecified: Secondary | ICD-10-CM

## 2021-01-14 DIAGNOSIS — Z79899 Other long term (current) drug therapy: Secondary | ICD-10-CM

## 2021-01-14 DIAGNOSIS — I10 Essential (primary) hypertension: Secondary | ICD-10-CM

## 2021-01-14 MED ORDER — LISINOPRIL 10 MG PO TABS: 10.0000 mg | ORAL_TABLET | Freq: Every day | ORAL | 1 refills | Status: DC

## 2021-01-14 NOTE — Telephone Encounter (Signed)
   *  STAT* If patient is at the pharmacy, call can be transferred to refill team.   1. Which medications need to be refilled? (please list name of each medication and dose if known) lisinopril (ZESTRIL) 10 MG tablet  2. Which pharmacy/location (including street and city if local pharmacy) is medication to be sent to? [facility] - [place], [place] - [address] AT [address]  3. Do they need a 30 day or 90 day supply? 90 days

## 2021-01-15 ENCOUNTER — Ambulatory Visit (INDEPENDENT_AMBULATORY_CARE_PROVIDER_SITE_OTHER): Payer: Medicare Other | Admitting: Cardiovascular Disease

## 2021-01-15 DIAGNOSIS — Z7901 Long term (current) use of anticoagulants: Secondary | ICD-10-CM | POA: Diagnosis not present

## 2021-01-15 DIAGNOSIS — Z5181 Encounter for therapeutic drug level monitoring: Secondary | ICD-10-CM | POA: Diagnosis not present

## 2021-01-15 LAB — POCT INR: INR: 5 — AB (ref 2.0–3.0)

## 2021-01-22 NOTE — Progress Notes (Signed)
Remote ICD transmission.   

## 2021-01-23 ENCOUNTER — Telehealth: Payer: Self-pay

## 2021-01-23 LAB — POCT INR: INR: 2 (ref 2.0–3.0)

## 2021-01-23 NOTE — Telephone Encounter (Signed)
Called and spoke w/pt who stated that they would check their inr today and call it in to [place] by tomorrow

## 2021-01-24 ENCOUNTER — Ambulatory Visit (INDEPENDENT_AMBULATORY_CARE_PROVIDER_SITE_OTHER): Payer: Medicare Other | Admitting: Cardiology

## 2021-01-24 DIAGNOSIS — Z5181 Encounter for therapeutic drug level monitoring: Secondary | ICD-10-CM | POA: Diagnosis not present

## 2021-01-24 DIAGNOSIS — Z7901 Long term (current) use of anticoagulants: Secondary | ICD-10-CM | POA: Diagnosis not present

## 2021-01-29 ENCOUNTER — Ambulatory Visit (INDEPENDENT_AMBULATORY_CARE_PROVIDER_SITE_OTHER): Payer: Medicare Other | Admitting: Cardiology

## 2021-01-29 DIAGNOSIS — Z5181 Encounter for therapeutic drug level monitoring: Secondary | ICD-10-CM

## 2021-01-29 LAB — POCT INR: INR: 2.1 (ref 2.0–3.0)

## 2021-02-03 ENCOUNTER — Encounter: Payer: Medicare Other | Admitting: Cardiovascular Disease

## 2021-02-05 ENCOUNTER — Ambulatory Visit (INDEPENDENT_AMBULATORY_CARE_PROVIDER_SITE_OTHER): Payer: Medicare Other | Admitting: Cardiology

## 2021-02-05 ENCOUNTER — Ambulatory Visit: Payer: Medicare Other | Admitting: Cardiovascular Disease

## 2021-02-05 DIAGNOSIS — Z7901 Long term (current) use of anticoagulants: Secondary | ICD-10-CM | POA: Diagnosis not present

## 2021-02-05 DIAGNOSIS — I4891 Unspecified atrial fibrillation: Secondary | ICD-10-CM

## 2021-02-05 DIAGNOSIS — I82622 Acute embolism and thrombosis of deep veins of left upper extremity: Secondary | ICD-10-CM

## 2021-02-05 LAB — POCT INR: INR: 3.8 — AB (ref 2.0–3.0)

## 2021-02-10 ENCOUNTER — Ambulatory Visit (INDEPENDENT_AMBULATORY_CARE_PROVIDER_SITE_OTHER): Payer: Medicare Other

## 2021-02-10 DIAGNOSIS — Z95810 Presence of automatic (implantable) cardiac defibrillator: Secondary | ICD-10-CM | POA: Diagnosis not present

## 2021-02-10 DIAGNOSIS — I5022 Chronic systolic (congestive) heart failure: Secondary | ICD-10-CM | POA: Diagnosis not present

## 2021-02-12 ENCOUNTER — Ambulatory Visit (INDEPENDENT_AMBULATORY_CARE_PROVIDER_SITE_OTHER): Payer: Medicare Other | Admitting: Internal Medicine

## 2021-02-12 DIAGNOSIS — I82622 Acute embolism and thrombosis of deep veins of left upper extremity: Secondary | ICD-10-CM

## 2021-02-12 DIAGNOSIS — Z7901 Long term (current) use of anticoagulants: Secondary | ICD-10-CM | POA: Diagnosis not present

## 2021-02-12 DIAGNOSIS — Z5181 Encounter for therapeutic drug level monitoring: Secondary | ICD-10-CM

## 2021-02-12 LAB — POCT INR: INR: 2.3 (ref 2.0–3.0)

## 2021-02-12 NOTE — Progress Notes (Signed)
EPIC Encounter for ICM Monitoring  Patient Name: Marc Potter is a 75 y.o. male Date: 02/12/2021 Primary Care Physican: Nonnie Done., MD Primary Cardiologist: Allyson Sabal Electrophysiologist: Croitoru 02/12/2021 Weight: 177 lbs                                   Spoke with patient and heart failure questions reviewed.  Pt asymptomatic for fluid accumulation and feeling well.   Optivol thoracic impedance suggesting normal fluid levels.   Prescribed dosage: No diuretic   Recommendations: No changes and encouraged to call if experiencing any fluid symptoms.   Follow-up plan: ICM clinic phone appointment on 03/25/2021.   91 day device clinic remote transmission 03/31/2021.     EP/Cardiology Office Visits: 03/26/2021 with Dr. Allyson Sabal.  03/17/2021 with Dr Royann Shivers.     Copy of ICM check sent to Dr. Royann Shivers.    3 month ICM trend: 02/10/2021.    1 Year ICM trend:       Karie Soda, RN 02/12/2021 3:23 PM

## 2021-02-19 ENCOUNTER — Ambulatory Visit (INDEPENDENT_AMBULATORY_CARE_PROVIDER_SITE_OTHER): Payer: Medicare Other | Admitting: Internal Medicine

## 2021-02-19 DIAGNOSIS — Z7901 Long term (current) use of anticoagulants: Secondary | ICD-10-CM | POA: Diagnosis not present

## 2021-02-19 DIAGNOSIS — Z5181 Encounter for therapeutic drug level monitoring: Secondary | ICD-10-CM | POA: Diagnosis not present

## 2021-02-19 LAB — POCT INR: INR: 2.4 (ref 2.0–3.0)

## 2021-02-21 ENCOUNTER — Other Ambulatory Visit: Payer: Self-pay | Admitting: Cardiovascular Disease

## 2021-02-26 ENCOUNTER — Ambulatory Visit (INDEPENDENT_AMBULATORY_CARE_PROVIDER_SITE_OTHER): Payer: Medicare Other | Admitting: Internal Medicine

## 2021-02-26 DIAGNOSIS — Z7901 Long term (current) use of anticoagulants: Secondary | ICD-10-CM

## 2021-02-26 DIAGNOSIS — Z5181 Encounter for therapeutic drug level monitoring: Secondary | ICD-10-CM

## 2021-02-26 LAB — POCT INR: INR: 2.1 (ref 2.0–3.0)

## 2021-03-05 ENCOUNTER — Ambulatory Visit (INDEPENDENT_AMBULATORY_CARE_PROVIDER_SITE_OTHER): Payer: Medicare Other | Admitting: Cardiology

## 2021-03-05 DIAGNOSIS — I82622 Acute embolism and thrombosis of deep veins of left upper extremity: Secondary | ICD-10-CM | POA: Diagnosis not present

## 2021-03-05 DIAGNOSIS — Z5181 Encounter for therapeutic drug level monitoring: Secondary | ICD-10-CM

## 2021-03-05 LAB — POCT INR: INR: 2.8 (ref 2.0–3.0)

## 2021-03-12 ENCOUNTER — Ambulatory Visit (INDEPENDENT_AMBULATORY_CARE_PROVIDER_SITE_OTHER): Payer: Medicare Other | Admitting: Cardiology

## 2021-03-12 DIAGNOSIS — Z5181 Encounter for therapeutic drug level monitoring: Secondary | ICD-10-CM

## 2021-03-12 LAB — POCT INR: INR: 2.5 (ref 2.0–3.0)

## 2021-03-17 ENCOUNTER — Encounter: Payer: Medicare Other | Admitting: Cardiovascular Disease

## 2021-03-25 ENCOUNTER — Ambulatory Visit (INDEPENDENT_AMBULATORY_CARE_PROVIDER_SITE_OTHER): Payer: Medicare Other

## 2021-03-25 DIAGNOSIS — Z95810 Presence of automatic (implantable) cardiac defibrillator: Secondary | ICD-10-CM | POA: Diagnosis not present

## 2021-03-25 DIAGNOSIS — I5022 Chronic systolic (congestive) heart failure: Secondary | ICD-10-CM | POA: Diagnosis not present

## 2021-03-26 ENCOUNTER — Ambulatory Visit: Payer: Medicare Other | Admitting: Cardiovascular Disease

## 2021-03-26 ENCOUNTER — Ambulatory Visit (INDEPENDENT_AMBULATORY_CARE_PROVIDER_SITE_OTHER): Payer: Medicare Other | Admitting: Cardiology

## 2021-03-26 DIAGNOSIS — Z7901 Long term (current) use of anticoagulants: Secondary | ICD-10-CM | POA: Diagnosis not present

## 2021-03-26 DIAGNOSIS — Z5181 Encounter for therapeutic drug level monitoring: Secondary | ICD-10-CM

## 2021-03-26 LAB — POCT INR: INR: 3.2 — AB (ref 2.0–3.0)

## 2021-03-28 NOTE — Progress Notes (Signed)
EPIC Encounter for ICM Monitoring  Patient Name: Marc Potter is a 75 y.o. male Date: 03/28/2021 Primary Care Physican: Nonnie Done., MD Electrophysiologist: Croitoru 02/12/2021 Weight: 177 lbs                                   Spoke with patient and heart failure questions reviewed.  Pt asymptomatic for fluid accumulation and feeling well.   Optivol thoracic impedance suggesting normal fluid levels.   Prescribed dosage: No diuretic   Recommendations: No changes and encouraged to call if experiencing any fluid symptoms.   Follow-up plan: ICM clinic phone appointment on 04/28/2021.   91 day device clinic remote transmission 06/30/2021.     EP/Cardiology Office Visits: 06/18/2021 with Dr. Allyson Sabal.  06/23/2021 with Dr Royann Shivers.     Copy of ICM check sent to Dr. Royann Shivers.     3 month ICM trend: 03/27/2021.    1 Year ICM trend:       Karie Soda, RN 03/28/2021 3:27 PM

## 2021-03-31 ENCOUNTER — Ambulatory Visit (INDEPENDENT_AMBULATORY_CARE_PROVIDER_SITE_OTHER): Payer: Medicare Other

## 2021-03-31 DIAGNOSIS — I428 Other cardiomyopathies: Secondary | ICD-10-CM

## 2021-04-02 LAB — CUP PACEART REMOTE DEVICE CHECK
Battery Remaining Longevity: 49 mo
Battery Voltage: 2.94 V
Brady Statistic RV Percent Paced: 0.33 %
Date Time Interrogation Session: 20221017022824
HighPow Impedance: 75 Ohm
Implantable Lead Implant Date: 20161220
Implantable Lead Location: 753860
Implantable Pulse Generator Implant Date: 20161220
Lead Channel Impedance Value: 323 Ohm
Lead Channel Impedance Value: 380 Ohm
Lead Channel Pacing Threshold Amplitude: 1.125 V
Lead Channel Pacing Threshold Pulse Width: 0.4 ms
Lead Channel Sensing Intrinsic Amplitude: 11.5 mV
Lead Channel Sensing Intrinsic Amplitude: 11.5 mV
Lead Channel Setting Pacing Amplitude: 2.5 V
Lead Channel Setting Pacing Pulse Width: 0.4 ms
Lead Channel Setting Sensing Sensitivity: 0.3 mV

## 2021-04-09 ENCOUNTER — Ambulatory Visit (INDEPENDENT_AMBULATORY_CARE_PROVIDER_SITE_OTHER): Payer: Medicare Other | Admitting: Cardiology

## 2021-04-09 DIAGNOSIS — Z5181 Encounter for therapeutic drug level monitoring: Secondary | ICD-10-CM | POA: Diagnosis not present

## 2021-04-09 DIAGNOSIS — Z7901 Long term (current) use of anticoagulants: Secondary | ICD-10-CM | POA: Diagnosis not present

## 2021-04-09 DIAGNOSIS — Z86718 Personal history of other venous thrombosis and embolism: Secondary | ICD-10-CM | POA: Diagnosis not present

## 2021-04-09 LAB — POCT INR: INR: 2.4 (ref 2.0–3.0)

## 2021-04-09 NOTE — Progress Notes (Signed)
Remote ICD transmission.   

## 2021-04-23 ENCOUNTER — Ambulatory Visit (INDEPENDENT_AMBULATORY_CARE_PROVIDER_SITE_OTHER): Payer: Medicare Other | Admitting: Cardiology

## 2021-04-23 DIAGNOSIS — Z5181 Encounter for therapeutic drug level monitoring: Secondary | ICD-10-CM

## 2021-04-23 DIAGNOSIS — I82622 Acute embolism and thrombosis of deep veins of left upper extremity: Secondary | ICD-10-CM | POA: Diagnosis not present

## 2021-04-23 LAB — POCT INR: INR: 1.9 — AB (ref 2.0–3.0)

## 2021-04-28 ENCOUNTER — Ambulatory Visit (INDEPENDENT_AMBULATORY_CARE_PROVIDER_SITE_OTHER): Payer: Medicare Other

## 2021-04-28 DIAGNOSIS — Z95810 Presence of automatic (implantable) cardiac defibrillator: Secondary | ICD-10-CM | POA: Diagnosis not present

## 2021-04-28 DIAGNOSIS — I5022 Chronic systolic (congestive) heart failure: Secondary | ICD-10-CM

## 2021-05-02 NOTE — Progress Notes (Signed)
EPIC Encounter for ICM Monitoring  Patient Name: Marc Potter is a 75 y.o. male Date: 05/02/2021 Primary Care Physican: Nonnie Done., MD Primary Cardiologist: Allyson Sabal Electrophysiologist: Croitoru 05/02/2021 Weight: 181 lbs                                   Spoke with patient and heart failure questions reviewed.  Pt asymptomatic for fluid accumulation and feeling well.   Optivol thoracic impedance suggesting normal fluid levels.   Prescribed dosage: No diuretic   Recommendations: No changes and encouraged to call if experiencing any fluid symptoms.   Follow-up plan: ICM clinic phone appointment on 06/02/2021.   91 day device clinic remote transmission 06/30/2021.     EP/Cardiology Office Visits: 06/18/2021 with Dr. Allyson Sabal.  06/23/2021 with Dr Royann Shivers.     Copy of ICM check sent to Dr. Royann Shivers.    3 month ICM trend: 04/28/2021.    12-14 Month ICM trend:       Karie Soda, RN 05/02/2021 1:59 PM

## 2021-05-07 ENCOUNTER — Ambulatory Visit (INDEPENDENT_AMBULATORY_CARE_PROVIDER_SITE_OTHER): Payer: Medicare Other | Admitting: Cardiovascular Disease

## 2021-05-07 DIAGNOSIS — Z5181 Encounter for therapeutic drug level monitoring: Secondary | ICD-10-CM | POA: Diagnosis not present

## 2021-05-07 DIAGNOSIS — Z86718 Personal history of other venous thrombosis and embolism: Secondary | ICD-10-CM | POA: Diagnosis not present

## 2021-05-07 LAB — POCT INR: INR: 2.5 (ref 2.0–3.0)

## 2021-05-21 ENCOUNTER — Ambulatory Visit (INDEPENDENT_AMBULATORY_CARE_PROVIDER_SITE_OTHER): Payer: Medicare Other | Admitting: Cardiovascular Disease

## 2021-05-21 DIAGNOSIS — Z5181 Encounter for therapeutic drug level monitoring: Secondary | ICD-10-CM

## 2021-05-21 LAB — POCT INR: INR: 3 (ref 2.0–3.0)

## 2021-06-02 ENCOUNTER — Ambulatory Visit (INDEPENDENT_AMBULATORY_CARE_PROVIDER_SITE_OTHER): Payer: Medicare Other

## 2021-06-02 DIAGNOSIS — Z95810 Presence of automatic (implantable) cardiac defibrillator: Secondary | ICD-10-CM | POA: Diagnosis not present

## 2021-06-02 DIAGNOSIS — I5022 Chronic systolic (congestive) heart failure: Secondary | ICD-10-CM | POA: Diagnosis not present

## 2021-06-04 ENCOUNTER — Ambulatory Visit (INDEPENDENT_AMBULATORY_CARE_PROVIDER_SITE_OTHER): Payer: Medicare Other | Admitting: Cardiology

## 2021-06-04 DIAGNOSIS — Z86718 Personal history of other venous thrombosis and embolism: Secondary | ICD-10-CM

## 2021-06-04 DIAGNOSIS — Z5181 Encounter for therapeutic drug level monitoring: Secondary | ICD-10-CM | POA: Diagnosis not present

## 2021-06-04 LAB — POCT INR: INR: 2.5 (ref 2.0–3.0)

## 2021-06-04 NOTE — Progress Notes (Signed)
EPIC Encounter for ICM Monitoring  Patient Name: Marc Potter is a 75 y.o. male Date: 06/04/2021 Primary Care Physican: Nonnie Done., MD Primary Cardiologist: Allyson Sabal Electrophysiologist: Croitoru 05/02/2021 Weight: 181 lbs                                   Spoke with patient and heart failure questions reviewed.  Pt asymptomatic for fluid accumulation and feeling reasonable well with exception of a cold.   Optivol thoracic impedance suggesting normal fluid levels.   Prescribed dosage: No diuretic   Recommendations: No changes and encouraged to call if experiencing any fluid symptoms.   Follow-up plan: ICM clinic phone appointment on 07/03/2021.   91 day device clinic remote transmission 06/30/2021.     EP/Cardiology Office Visits:  06/18/2021 with Dr. Allyson Sabal.   06/23/2021 with Dr Royann Shivers.     Copy of ICM check sent to Dr. Royann Shivers.    3 month ICM trend: 06/02/2021.    12-14 Month ICM trend:       Karie Soda, RN 06/04/2021 1:17 PM

## 2021-06-18 ENCOUNTER — Ambulatory Visit: Payer: Medicare Other | Admitting: Cardiovascular Disease

## 2021-06-18 ENCOUNTER — Telehealth: Payer: Self-pay

## 2021-06-18 ENCOUNTER — Ambulatory Visit (INDEPENDENT_AMBULATORY_CARE_PROVIDER_SITE_OTHER): Payer: Medicare Other | Admitting: Internal Medicine

## 2021-06-18 DIAGNOSIS — Z5181 Encounter for therapeutic drug level monitoring: Secondary | ICD-10-CM | POA: Diagnosis not present

## 2021-06-18 DIAGNOSIS — Z86718 Personal history of other venous thrombosis and embolism: Secondary | ICD-10-CM | POA: Diagnosis not present

## 2021-06-18 LAB — POCT INR: INR: 2.4 (ref 2.0–3.0)

## 2021-06-18 NOTE — Telephone Encounter (Signed)
Marc Potter is calling stating he is returning Michael's call.

## 2021-06-18 NOTE — Telephone Encounter (Signed)
Spoke to patient and discussed INR result/recommendation.  Verbalized understanding

## 2021-06-23 ENCOUNTER — Encounter: Payer: Medicare Other | Admitting: Cardiovascular Disease

## 2021-06-30 ENCOUNTER — Ambulatory Visit (INDEPENDENT_AMBULATORY_CARE_PROVIDER_SITE_OTHER): Payer: Medicare Other

## 2021-06-30 DIAGNOSIS — I428 Other cardiomyopathies: Secondary | ICD-10-CM

## 2021-06-30 DIAGNOSIS — I5022 Chronic systolic (congestive) heart failure: Secondary | ICD-10-CM | POA: Diagnosis not present

## 2021-07-01 LAB — CUP PACEART REMOTE DEVICE CHECK
Battery Remaining Longevity: 48 mo
Battery Voltage: 2.93 V
Brady Statistic RV Percent Paced: 0.32 %
Date Time Interrogation Session: 20230116033623
HighPow Impedance: 72 Ohm
Implantable Lead Implant Date: 20161220
Implantable Lead Location: 753860
Implantable Pulse Generator Implant Date: 20161220
Lead Channel Impedance Value: 342 Ohm
Lead Channel Impedance Value: 456 Ohm
Lead Channel Pacing Threshold Amplitude: 1.125 V
Lead Channel Pacing Threshold Pulse Width: 0.4 ms
Lead Channel Sensing Intrinsic Amplitude: 12.625 mV
Lead Channel Sensing Intrinsic Amplitude: 12.625 mV
Lead Channel Setting Pacing Amplitude: 2.5 V
Lead Channel Setting Pacing Pulse Width: 0.4 ms
Lead Channel Setting Sensing Sensitivity: 0.3 mV

## 2021-07-02 ENCOUNTER — Ambulatory Visit (INDEPENDENT_AMBULATORY_CARE_PROVIDER_SITE_OTHER): Payer: Medicare Other | Admitting: Cardiovascular Disease

## 2021-07-02 DIAGNOSIS — Z86718 Personal history of other venous thrombosis and embolism: Secondary | ICD-10-CM

## 2021-07-02 DIAGNOSIS — Z5181 Encounter for therapeutic drug level monitoring: Secondary | ICD-10-CM | POA: Diagnosis not present

## 2021-07-02 LAB — POCT INR: INR: 2 (ref 2.0–3.0)

## 2021-07-03 ENCOUNTER — Ambulatory Visit (INDEPENDENT_AMBULATORY_CARE_PROVIDER_SITE_OTHER): Payer: Medicare Other

## 2021-07-03 DIAGNOSIS — Z95810 Presence of automatic (implantable) cardiac defibrillator: Secondary | ICD-10-CM | POA: Diagnosis not present

## 2021-07-03 DIAGNOSIS — I5022 Chronic systolic (congestive) heart failure: Secondary | ICD-10-CM | POA: Diagnosis not present

## 2021-07-03 NOTE — Progress Notes (Signed)
EPIC Encounter for ICM Monitoring  Patient Name: Marc Potter is a 76 y.o. male Date: 07/03/2021 Primary Care Physican: Nonnie Done., MD Primary Cardiologist: Allyson Sabal Electrophysiologist: Croitoru 07/03/2021 Weight: 178 lbs                                   Spoke with patient and heart failure questions reviewed.  Pt asymptomatic for fluid accumulation and feeling reasonably well.   Optivol thoracic impedance suggesting normal fluid levels.   Prescribed dosage: No diuretic   Recommendations: No changes and encouraged to call if experiencing any fluid symptoms.   Follow-up plan: ICM clinic phone appointment on 08/04/2021.   91 day device clinic remote transmission 09/29/2021.     EP/Cardiology Office Visits:  Pt canceled visit 06/18/2021 with Dr. Allyson Sabal and 06/23/2021 with Dr Royann Shivers.     Copy of ICM check sent to Dr. Royann Shivers.    3 month ICM trend: 07/03/2021.    12-14 Month ICM trend:     Karie Soda, RN 07/03/2021 1:20 PM

## 2021-07-11 NOTE — Progress Notes (Signed)
Remote ICD transmission.   

## 2021-07-16 ENCOUNTER — Ambulatory Visit (INDEPENDENT_AMBULATORY_CARE_PROVIDER_SITE_OTHER): Payer: Medicare Other | Admitting: Cardiovascular Disease

## 2021-07-16 DIAGNOSIS — Z5181 Encounter for therapeutic drug level monitoring: Secondary | ICD-10-CM

## 2021-07-16 DIAGNOSIS — Z86718 Personal history of other venous thrombosis and embolism: Secondary | ICD-10-CM

## 2021-07-16 LAB — POCT INR: INR: 2.9 (ref 2.0–3.0)

## 2021-07-25 ENCOUNTER — Other Ambulatory Visit: Payer: Self-pay | Admitting: Cardiovascular Disease

## 2021-07-25 DIAGNOSIS — I10 Essential (primary) hypertension: Secondary | ICD-10-CM

## 2021-07-25 DIAGNOSIS — I519 Heart disease, unspecified: Secondary | ICD-10-CM

## 2021-07-25 DIAGNOSIS — I482 Chronic atrial fibrillation, unspecified: Secondary | ICD-10-CM

## 2021-07-25 DIAGNOSIS — Z79899 Other long term (current) drug therapy: Secondary | ICD-10-CM

## 2021-08-04 ENCOUNTER — Ambulatory Visit (INDEPENDENT_AMBULATORY_CARE_PROVIDER_SITE_OTHER): Payer: Medicare Other

## 2021-08-04 DIAGNOSIS — Z95810 Presence of automatic (implantable) cardiac defibrillator: Secondary | ICD-10-CM | POA: Diagnosis not present

## 2021-08-04 DIAGNOSIS — I5022 Chronic systolic (congestive) heart failure: Secondary | ICD-10-CM

## 2021-08-08 NOTE — Progress Notes (Signed)
EPIC Encounter for ICM Monitoring  Patient Name: Marc Potter is a 76 y.o. male Date: 08/08/2021 Primary Care Physican: Nonnie Done., MD Primary Cardiologist: Allyson Sabal Electrophysiologist: Croitoru 07/03/2021 Weight: 178 lbs                                   Spoke with patient and heart failure questions reviewed.  Pt asymptomatic for fluid accumulation and feeling reasonably well.   Optivol thoracic impedance suggesting normal fluid levels.   Prescribed dosage: No diuretic   Recommendations:   Pt reports he has relocated to [place] and will be followed for his device and cardiology by [facility] and released to that clinic.    Follow-up plan: No further ICM or device clinic remote transmissions since patient has relocated to [place] and receiving care at [facility].     EP/Cardiology Office Visits:  Pt canceled visit 06/18/2021 with Dr. Allyson Sabal and 06/23/2021 with Dr Royann Shivers.     Copy of ICM check sent to Dr. Royann Shivers.    3 month ICM trend: 08/04/2021.    12-14 Month ICM trend:     Karie Soda, RN 08/08/2021 2:27 PM

## 2021-09-30 ENCOUNTER — Telehealth: Payer: Self-pay | Admitting: Cardiovascular Disease

## 2021-09-30 NOTE — Telephone Encounter (Signed)
Pt wanted Dr. Salena Saner to be aware that his device is being monitored while in [place] and [employer] is aware as well. Pt would like a call back if any questions come about. Please advise ?

## 2021-10-16 ENCOUNTER — Telehealth: Payer: Self-pay

## 2021-10-16 NOTE — Telephone Encounter (Signed)
I spoke to patient who is still residing in [place] and being managed for INR there.  He will call when returning to [facility]. ?

## 2021-12-29 ENCOUNTER — Ambulatory Visit (INDEPENDENT_AMBULATORY_CARE_PROVIDER_SITE_OTHER): Payer: Medicare Other

## 2021-12-29 DIAGNOSIS — I5022 Chronic systolic (congestive) heart failure: Secondary | ICD-10-CM

## 2022-08-31 ENCOUNTER — Telehealth: Payer: Self-pay

## 2022-08-31 NOTE — Telephone Encounter (Signed)
Pt wife made [place] aware that he is being followed in [place].
